# Patient Record
Sex: Female | Born: 1995 | Race: White | Hispanic: Yes | Marital: Married | Smoking: Never smoker
Health system: Southern US, Community
[De-identification: ages and names within clinical notes are randomized; demographics above are authoritative.]

## PROBLEM LIST (undated history)

## (undated) DIAGNOSIS — G43109 Migraine with aura, not intractable, without status migrainosus: Secondary | ICD-10-CM

## (undated) DIAGNOSIS — F32A Depression, unspecified: Secondary | ICD-10-CM

## (undated) DIAGNOSIS — F329 Major depressive disorder, single episode, unspecified: Secondary | ICD-10-CM

## (undated) DIAGNOSIS — L709 Acne, unspecified: Secondary | ICD-10-CM

## (undated) DIAGNOSIS — F419 Anxiety disorder, unspecified: Secondary | ICD-10-CM

## (undated) DIAGNOSIS — K219 Gastro-esophageal reflux disease without esophagitis: Secondary | ICD-10-CM

## (undated) DIAGNOSIS — T7840XA Allergy, unspecified, initial encounter: Secondary | ICD-10-CM

## (undated) DIAGNOSIS — N390 Urinary tract infection, site not specified: Secondary | ICD-10-CM

## (undated) DIAGNOSIS — J45909 Unspecified asthma, uncomplicated: Secondary | ICD-10-CM

## (undated) DIAGNOSIS — J3081 Allergic rhinitis due to animal (cat) (dog) hair and dander: Secondary | ICD-10-CM

## (undated) HISTORY — DX: Allergy, unspecified, initial encounter: T78.40XA

## (undated) HISTORY — DX: Depression, unspecified: F32.A

## (undated) HISTORY — DX: Migraine with aura, not intractable, without status migrainosus: G43.109

## (undated) HISTORY — DX: Urinary tract infection, site not specified: N39.0

## (undated) HISTORY — DX: Unspecified asthma, uncomplicated: J45.909

## (undated) HISTORY — DX: Anxiety disorder, unspecified: F41.9

## (undated) HISTORY — DX: Allergic rhinitis due to animal (cat) (dog) hair and dander: J30.81

## (undated) HISTORY — DX: Major depressive disorder, single episode, unspecified: F32.9

## (undated) HISTORY — PX: WISDOM TOOTH EXTRACTION: SHX21

---
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
## 2017-04-26 ENCOUNTER — Ambulatory Visit (INDEPENDENT_AMBULATORY_CARE_PROVIDER_SITE_OTHER): Payer: Commercial Managed Care - PPO | Admitting: Obstetrics and Gynecology

## 2017-04-26 ENCOUNTER — Encounter: Payer: Self-pay | Admitting: Obstetrics and Gynecology

## 2017-04-26 VITALS — BP 100/62 | HR 80 | Resp 16 | Ht 65.0 in | Wt 128.6 lb

## 2017-04-26 DIAGNOSIS — Z113 Encounter for screening for infections with a predominantly sexual mode of transmission: Secondary | ICD-10-CM | POA: Diagnosis not present

## 2017-04-26 DIAGNOSIS — Z01419 Encounter for gynecological examination (general) (routine) without abnormal findings: Secondary | ICD-10-CM

## 2017-04-26 DIAGNOSIS — N899 Noninflammatory disorder of vagina, unspecified: Secondary | ICD-10-CM | POA: Diagnosis not present

## 2017-04-26 NOTE — Progress Notes (Signed)
21 y.o. G0P0000 Single Caucasian female here for annual exam.    Menses are not a problem. Interested not to have a menstrual period.   Used a cream as a child "to help increase vaginal size."  Able to use tampons but with difficulty.  Unable to have penetration.   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   Labs with PCP.   PCP:  Deatra JamesVyvyan Sun, MD  Patient's last menstrual period was 04/23/2017 (exact date).     Period Cycle (Days): 30 Period Duration (Days): 5 days Period Pattern: Regular Menstrual Flow: Moderate Menstrual Control: Maxi pad Menstrual Control Change Freq (Hours): every 4-6 hours Dysmenorrhea: (mild to moderate)     Sexually active: Yes.   Female/female The current method of family planning is condoms most of the time.    Exercising: Yes.    Weights, treadmill, high intensity training Smoker:  no  Health Maintenance: Pap: n/a History of abnormal Pap:  n/a MMG:  n/a Colonoscopy:  n/a BMD:   n/a  Result  n/a TDaP: up to date--in college Gardasil:   no HIV:no Hep C:no Screening Labs:   ----   reports that  has never smoked. she has never used smokeless tobacco. She reports that she does not drink alcohol or use drugs.  Past Medical History:  Diagnosis Date  . Allergy    Has allergies to dogs and cats.  . Anxiety   . Asthma   . Cat allergies 04/20/2013  . Depression   . Migraine headache with aura     Past Surgical History:  Procedure Laterality Date  . WISDOM TOOTH EXTRACTION      Current Outpatient Medications  Medication Sig Dispense Refill  . albuterol (PROVENTIL HFA;VENTOLIN HFA) 108 (90 BASE) MCG/ACT inhaler Inhale 2 puffs into the lungs every 6 (six) hours as needed for wheezing (One for school and one for home). 2 Inhaler 3  . cetirizine (ZYRTEC) 10 MG tablet Take 10 mg by mouth daily.    . famotidine (PEPCID AC) 10 MG chewable tablet Chew 10 mg by mouth daily as needed for heartburn.    . fluticasone  (FLONASE) 50 MCG/ACT nasal spray Place 1 spray into both nostrils 2 (two) times daily as needed for allergies or rhinitis.    Marland Kitchen. ibuprofen (ADVIL,MOTRIN) 200 MG tablet Take 400-600 mg by mouth 2 (two) times daily as needed for headache or moderate pain.    Marland Kitchen. spironolactone (ALDACTONE) 25 MG tablet Take 25 mg daily by mouth.     No current facility-administered medications for this visit.     Family History  Problem Relation Age of Onset  . Hypertension Mother   . Hyperlipidemia Mother   . Scoliosis Mother   . Lupus Maternal Grandmother   . Osteoporosis Maternal Grandmother   . Heart disease Maternal Grandfather   . Hypertension Maternal Grandfather   . Diabetes Maternal Grandfather   . Emphysema Maternal Grandfather   . Heart attack Maternal Grandfather        occurred < 19 years old  . Hyperlipidemia Maternal Grandfather   . COPD Maternal Grandfather   . Cancer Maternal Grandfather        skin cancer  . Osteoarthritis Paternal Grandmother   . Diabetes Paternal Grandfather   . Heart disease Paternal Grandfather   . Kidney failure Paternal Grandfather        2 transplants due to DM  . Hypertension Paternal Grandfather   . Hyperlipidemia Paternal Grandfather   .  Stroke Paternal Grandfather   . Parkinson's disease Paternal Grandfather     ROS:  Pertinent items are noted in HPI.  Otherwise, a comprehensive ROS was negative.  Exam:   BP 100/62 (BP Location: Right Arm, Patient Position: Sitting, Cuff Size: Normal)   Pulse 80   Resp 16   Ht 5\' 5"  (1.651 m)   Wt 128 lb 9.6 oz (58.3 kg)   LMP 04/23/2017 (Exact Date)   BMI 21.40 kg/m     General appearance: alert, cooperative and appears stated age Head: Normocephalic, without obvious abnormality, atraumatic Neck: no adenopathy, supple, symmetrical, trachea midline and thyroid normal to inspection and palpation Lungs: clear to auscultation bilaterally Breasts: normal appearance, no masses or tenderness, No nipple retraction or  dimpling, No nipple discharge or bleeding, No axillary or supraclavicular adenopathy Heart: regular rate and rhythm Abdomen: soft, non-tender; no masses, no organomegaly Extremities: extremities normal, atraumatic, no cyanosis or edema Skin: Skin color, texture, turgor normal. No rashes or lesions Lymph nodes: Cervical, supraclavicular, and axillary nodes normal. No abnormal inguinal nodes palpated Neurologic: Grossly normal  Pelvic: External genitalia:  Vaginal opening small with skin covering over the lower portion of the vaginal opening creating the appearance of a long perineal body.  Skin palpates to be thin. Small Q tip enters the vaginal canal without difficulty.              Urethra:  normal appearing urethra with no masses, tenderness or lesions              Bartholins and Skenes: normal                 Vagina: normal appearing vagina with normal color and discharge, no lesions              Cervix: no lesions              Pap taken: No. Bimanual Exam:   Unable to perform.              Rectal exam: Yes.  .  Uterus small and nontender.  No adnexal masses.              Anus:  normal sphincter tone, no lesions  Chaperone was present for exam.  Assessment:   Well woman visit with normal exam. STD screening. Migraine with aura.  Hymenal abnormality.   Plan: Mammogram screening discussed. Recommended self breast awareness. Pap and HR HPV as above. Guidelines for Calcium, Vitamin D, regular exercise program including cardiovascular and weight bearing exercise. I discussed options for contraception.  For now, we will not initiate.  STD screening from serum and urine.  We reviewed Gardasil and she will consider. She will return for pelvic US to further define her reproductive anatomy.  We did discuss revision of the hymenal opening through outpatient surgery.   Follow up annually and prn.   After visit summary provided.

## 2017-04-26 NOTE — Patient Instructions (Addendum)
EXERCISE AND DIET:  We recommended that you start or continue a regular exercise program for good health. Regular exercise means any activity that makes your heart beat faster and makes you sweat.  We recommend exercising at least 30 minutes per day at least 3 days a week, preferably 4 or 5.  We also recommend a diet low in fat and sugar.  Inactivity, poor dietary choices and obesity can cause diabetes, heart attack, stroke, and kidney damage, among others.    ALCOHOL AND SMOKING:  Women should limit their alcohol intake to no more than 7 drinks/beers/glasses of wine (combined, not each!) per week. Moderation of alcohol intake to this level decreases your risk of breast cancer and liver damage. And of course, no recreational drugs are part of a healthy lifestyle.  And absolutely no smoking or even second hand smoke. Most people know smoking can cause heart and lung diseases, but did you know it also contributes to weakening of your bones? Aging of your skin?  Yellowing of your teeth and nails?  CALCIUM AND VITAMIN D:  Adequate intake of calcium and Vitamin D are recommended.  The recommendations for exact amounts of these supplements seem to change often, but generally speaking 600 mg of calcium (either carbonate or citrate) and 800 units of Vitamin D per day seems prudent. Certain women may benefit from higher intake of Vitamin D.  If you are among these women, your doctor will have told you during your visit.    PAP SMEARS:  Pap smears, to check for cervical cancer or precancers,  have traditionally been done yearly, although recent scientific advances have shown that most women can have pap smears less often.  However, every woman still should have a physical exam from her gynecologist every year. It will include a breast check, inspection of the vulva and vagina to check for abnormal growths or skin changes, a visual exam of the cervix, and then an exam to evaluate the size and shape of the uterus and  ovaries.  And after 19 years of age, a rectal exam is indicated to check for rectal cancers. We will also provide age appropriate advice regarding health maintenance, like when you should have certain vaccines, screening for sexually transmitted diseases, bone density testing, colonoscopy, mammograms, etc.   MAMMOGRAMS:  All women over 52 years old should have a yearly mammogram. Many facilities now offer a "3D" mammogram, which may cost around $50 extra out of pocket. If possible,  we recommend you accept the option to have the 3D mammogram performed.  It both reduces the number of women who will be called back for extra views which then turn out to be normal, and it is better than the routine mammogram at detecting truly abnormal areas.    COLONOSCOPY:  Colonoscopy to screen for colon cancer is recommended for all women at age 18.  We know, you hate the idea of the prep.  We agree, BUT, having colon cancer and not knowing it is worse!!  Colon cancer so often starts as a polyp that can be seen and removed at colonscopy, which can quite literally save your life!  And if your first colonoscopy is normal and you have no family history of colon cancer, most women don't have to have it again for 10 years.  Once every ten years, you can do something that may end up saving your life, right?  We will be happy to help you get it scheduled when you are ready.  Be sure to check your insurance coverage so you understand how much it will cost.  It may be covered as a preventative service at no cost, but you should check your particular policy.     Contraception Choices Contraception (birth control) is the use of any methods or devices to prevent pregnancy. Below are some methods to help avoid pregnancy. Hormonal methods  Contraceptive implant. This is a thin, plastic tube containing progesterone hormone. It does not contain estrogen hormone. Your health care provider inserts the tube in the inner part of the upper  arm. The tube can remain in place for up to 3 years. After 3 years, the implant must be removed. The implant prevents the ovaries from releasing an egg (ovulation), thickens the cervical mucus to prevent sperm from entering the uterus, and thins the lining of the inside of the uterus.  Progesterone-only injections. These injections are given every 3 months by your health care provider to prevent pregnancy. This synthetic progesterone hormone stops the ovaries from releasing eggs. It also thickens cervical mucus and changes the uterine lining. This makes it harder for sperm to survive in the uterus.  Birth control pills. These pills contain estrogen and progesterone hormone. They work by preventing the ovaries from releasing eggs (ovulation). They also cause the cervical mucus to thicken, preventing the sperm from entering the uterus. Birth control pills are prescribed by a health care provider.Birth control pills can also be used to treat heavy periods.  Minipill. This type of birth control pill contains only the progesterone hormone. They are taken every day of each month and must be prescribed by your health care provider.  Birth control patch. The patch contains hormones similar to those in birth control pills. It must be changed once a week and is prescribed by a health care provider.  Vaginal ring. The ring contains hormones similar to those in birth control pills. It is left in the vagina for 3 weeks, removed for 1 week, and then a new one is put back in place. The patient must be comfortable inserting and removing the ring from the vagina.A health care provider's prescription is necessary.  Emergency contraception. Emergency contraceptives prevent pregnancy after unprotected sexual intercourse. This pill can be taken right after sex or up to 5 days after unprotected sex. It is most effective the sooner you take the pills after having sexual intercourse. Most emergency contraceptive pills are  available without a prescription. Check with your pharmacist. Do not use emergency contraception as your only form of birth control. Barrier methods  Female condom. This is a thin sheath (latex or rubber) that is worn over the penis during sexual intercourse. It can be used with spermicide to increase effectiveness.  Female condom. This is a soft, loose-fitting sheath that is put into the vagina before sexual intercourse.  Diaphragm. This is a soft, latex, dome-shaped barrier that must be fitted by a health care provider. It is inserted into the vagina, along with a spermicidal jelly. It is inserted before intercourse. The diaphragm should be left in the vagina for 6 to 8 hours after intercourse.  Cervical cap. This is a round, soft, latex or plastic cup that fits over the cervix and must be fitted by a health care provider. The cap can be left in place for up to 48 hours after intercourse.  Sponge. This is a soft, circular piece of polyurethane foam. The sponge has spermicide in it. It is inserted into the vagina after wetting it  and before sexual intercourse.  Spermicides. These are chemicals that kill or block sperm from entering the cervix and uterus. They come in the form of creams, jellies, suppositories, foam, or tablets. They do not require a prescription. They are inserted into the vagina with an applicator before having sexual intercourse. The process must be repeated every time you have sexual intercourse. Intrauterine contraception  Intrauterine device (IUD). This is a T-shaped device that is put in a woman's uterus during a menstrual period to prevent pregnancy. There are 2 types: ? Copper IUD. This type of IUD is wrapped in copper wire and is placed inside the uterus. Copper makes the uterus and fallopian tubes produce a fluid that kills sperm. It can stay in place for 10 years. ? Hormone IUD. This type of IUD contains the hormone progestin (synthetic progesterone). The hormone thickens  the cervical mucus and prevents sperm from entering the uterus, and it also thins the uterine lining to prevent implantation of a fertilized egg. The hormone can weaken or kill the sperm that get into the uterus. It can stay in place for 3-5 years, depending on which type of IUD is used. Permanent methods of contraception  Female tubal ligation. This is when the woman's fallopian tubes are surgically sealed, tied, or blocked to prevent the egg from traveling to the uterus.  Hysteroscopic sterilization. This involves placing a small coil or insert into each fallopian tube. Your doctor uses a technique called hysteroscopy to do the procedure. The device causes scar tissue to form. This results in permanent blockage of the fallopian tubes, so the sperm cannot fertilize the egg. It takes about 3 months after the procedure for the tubes to become blocked. You must use another form of birth control for these 3 months.  Female sterilization. This is when the female has the tubes that carry sperm tied off (vasectomy).This blocks sperm from entering the vagina during sexual intercourse. After the procedure, the man can still ejaculate fluid (semen). Natural planning methods  Natural family planning. This is not having sexual intercourse or using a barrier method (condom, diaphragm, cervical cap) on days the woman could become pregnant.  Calendar method. This is keeping track of the length of each menstrual cycle and identifying when you are fertile.  Ovulation method. This is avoiding sexual intercourse during ovulation.  Symptothermal method. This is avoiding sexual intercourse during ovulation, using a thermometer and ovulation symptoms.  Post-ovulation method. This is timing sexual intercourse after you have ovulated. Regardless of which type or method of contraception you choose, it is important that you use condoms to protect against the transmission of sexually transmitted infections (STIs). Talk with  your health care provider about which form of contraception is most appropriate for you. This information is not intended to replace advice given to you by your health care provider. Make sure you discuss any questions you have with your health care provider. Document Released: 05/28/2005 Document Revised: 11/03/2015 Document Reviewed: 11/20/2012 Elsevier Interactive Patient Education  2017 Elsevier Inc.  Human Papillomavirus Quadrivalent Vaccine suspension for injection What is this medicine? HUMAN PAPILLOMAVIRUS VACCINE (HYOO muhn pap uh LOH muh vahy ruhs vak SEEN) is a vaccine. It is used to prevent infections of four types of the human papillomavirus. In women, the vaccine may lower your risk of getting cervical, vaginal, vulvar, or anal cancer and genital warts. In men, the vaccine may lower your risk of getting genital warts and anal cancer. You cannot get these diseases from the  vaccine. This vaccine does not treat these diseases. This medicine may be used for other purposes; ask your health care provider or pharmacist if you have questions. COMMON BRAND NAME(S): Gardasil What should I tell my health care provider before I take this medicine? They need to know if you have any of these conditions: -fever or infection -hemophilia -HIV infection or AIDS -immune system problems -low platelet count -an unusual reaction to Human Papillomavirus Vaccine, yeast, other medicines, foods, dyes, or preservatives -pregnant or trying to get pregnant -breast-feeding How should I use this medicine? This vaccine is for injection in a muscle on your upper arm or thigh. It is given by a health care professional. Dennis Bast will be observed for 15 minutes after each dose. Sometimes, fainting happens after the vaccine is given. You may be asked to sit or lie down during the 15 minutes. Three doses are given. The second dose is given 2 months after the first dose. The last dose is given 4 months after the second  dose. A copy of a Vaccine Information Statement will be given before each vaccination. Read this sheet carefully each time. The sheet may change frequently. Talk to your pediatrician regarding the use of this medicine in children. While this drug may be prescribed for children as young as 83 years of age for selected conditions, precautions do apply. Overdosage: If you think you have taken too much of this medicine contact a poison control center or emergency room at once. NOTE: This medicine is only for you. Do not share this medicine with others. What if I miss a dose? All 3 doses of the vaccine should be given within 6 months. Remember to keep appointments for follow-up doses. Your health care provider will tell you when to return for the next vaccine. Ask your health care professional for advice if you are unable to keep an appointment or miss a scheduled dose. What may interact with this medicine? -other vaccines This list may not describe all possible interactions. Give your health care provider a list of all the medicines, herbs, non-prescription drugs, or dietary supplements you use. Also tell them if you smoke, drink alcohol, or use illegal drugs. Some items may interact with your medicine. What should I watch for while using this medicine? This vaccine may not fully protect everyone. Continue to have regular pelvic exams and cervical or anal cancer screenings as directed by your doctor. The Human Papillomavirus is a sexually transmitted disease. It can be passed by any kind of sexual activity that involves genital contact. The vaccine works best when given before you have any contact with the virus. Many people who have the virus do not have any signs or symptoms. Tell your doctor or health care professional if you have any reaction or unusual symptom after getting the vaccine. What side effects may I notice from receiving this medicine? Side effects that you should report to your doctor or  health care professional as soon as possible: -allergic reactions like skin rash, itching or hives, swelling of the face, lips, or tongue -breathing problems -feeling faint or lightheaded, falls Side effects that usually do not require medical attention (report to your doctor or health care professional if they continue or are bothersome): -cough -dizziness -fever -headache -nausea -redness, warmth, swelling, pain, or itching at site where injected This list may not describe all possible side effects. Call your doctor for medical advice about side effects. You may report side effects to FDA at 1-800-FDA-1088. Where should  I keep my medicine? This drug is given in a hospital or clinic and will not be stored at home. NOTE: This sheet is a summary. It may not cover all possible information. If you have questions about this medicine, talk to your doctor, pharmacist, or health care provider.  2018 Elsevier/Gold Standard (2013-07-20 13:14:33)

## 2017-04-27 LAB — HEP, RPR, HIV PANEL
HEP B S AG: NEGATIVE
HIV Screen 4th Generation wRfx: NONREACTIVE
RPR: NONREACTIVE

## 2017-04-27 LAB — HEPATITIS C ANTIBODY: Hep C Virus Ab: 0.1 s/co ratio (ref 0.0–0.9)

## 2017-04-30 LAB — CHLAMYDIA/GONOCOCCUS/TRICHOMONAS, NAA
Chlamydia by NAA: NEGATIVE
Gonococcus by NAA: NEGATIVE
Trich vag by NAA: NEGATIVE

## 2017-05-16 ENCOUNTER — Ambulatory Visit (INDEPENDENT_AMBULATORY_CARE_PROVIDER_SITE_OTHER): Payer: Commercial Managed Care - PPO

## 2017-05-16 ENCOUNTER — Encounter: Payer: Self-pay | Admitting: Obstetrics and Gynecology

## 2017-05-16 ENCOUNTER — Other Ambulatory Visit: Payer: Self-pay | Admitting: Obstetrics and Gynecology

## 2017-05-16 ENCOUNTER — Ambulatory Visit (INDEPENDENT_AMBULATORY_CARE_PROVIDER_SITE_OTHER): Payer: Commercial Managed Care - PPO | Admitting: Obstetrics and Gynecology

## 2017-05-16 VITALS — BP 102/60 | HR 60 | Ht 65.0 in | Wt 129.0 lb

## 2017-05-16 DIAGNOSIS — N899 Noninflammatory disorder of vagina, unspecified: Secondary | ICD-10-CM | POA: Diagnosis not present

## 2017-05-16 DIAGNOSIS — N898 Other specified noninflammatory disorders of vagina: Secondary | ICD-10-CM

## 2017-05-16 NOTE — Progress Notes (Signed)
Patient ID: Judy MinorChloe [name], female   DOB: May 14, 1996, 21 y.o.   MRN: [id number] GYNECOLOGY  VISIT   HPI: 21 y.o.   Single  Caucasian  female   [id number] with Patient's last menstrual period was 04/23/2017 (exact date).   here for pelvic ultrasound to further define reproductive anatomy.    Has partial imperforate hymen.  Does menstruate. Unable to use tampons.  GYNECOLOGIC HISTORY: Patient's last menstrual period was 04/23/2017 (exact date). Contraception: condoms Menopausal hormone therapy:  n/a Last mammogram:  n/a Last pap smear:   n/a        OB History    Gravida Para Term Preterm AB Living   0 0 0 0 0 0   SAB TAB Ectopic Multiple Live Births   0 0 0 0 0         Patient Active Problem List   Diagnosis Date Noted  . 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 Costochondritis 02/04/2014  . Unspecified asthma(493.90) 04/20/2013  . Cat allergies 04/20/2013  . Seasonal allergies 04/20/2013    Past Medical History:  Diagnosis Date  . Allergy    Has allergies to dogs and cats.  . Anxiety   . Asthma   . Cat allergies 04/20/2013  . Depression   . Migraine headache with aura     Past Surgical History:  Procedure Laterality Date  . WISDOM TOOTH EXTRACTION      Current Outpatient Medications  Medication Sig Dispense Refill  . albuterol (PROVENTIL HFA;VENTOLIN HFA) 108 (90 BASE) MCG/ACT inhaler Inhale 2 puffs into the lungs every 6 (six) hours as needed for wheezing (One for school and one for home). 2 Inhaler 3  . cetirizine (ZYRTEC) 10 MG tablet Take 10 mg by mouth daily.    . famotidine (PEPCID AC) 10 MG chewable tablet Chew 10 mg by mouth daily as needed for heartburn.    . fluticasone (FLONASE) 50 MCG/ACT nasal spray Place 1 spray into both nostrils 2 (two) times daily as needed for allergies or rhinitis.    Marland Kitchen. ibuprofen (ADVIL,MOTRIN) 200 MG tablet Take 400-600 mg by mouth 2 (two) times daily as needed for headache or moderate pain.    Marland Kitchen. spironolactone (ALDACTONE) 25 MG  tablet Take 25 mg daily by mouth.     No current facility-administered medications for this visit.      ALLERGIES: Pollen extract  Family History  Problem Relation Age of Onset  . Hypertension Mother   . Hyperlipidemia Mother   . Scoliosis Mother   . Lupus Maternal Grandmother   . Osteoporosis Maternal Grandmother   . Heart disease Maternal Grandfather   . Hypertension Maternal Grandfather   . Diabetes Maternal Grandfather   . Emphysema Maternal Grandfather   . Heart attack Maternal Grandfather        occurred < 19 years old  . Hyperlipidemia Maternal Grandfather   . COPD Maternal Grandfather   . Cancer Maternal Grandfather        skin cancer  . Osteoarthritis Paternal Grandmother   . Diabetes Paternal Grandfather   . Heart disease Paternal Grandfather   . Kidney failure Paternal Grandfather        2 transplants due to DM  . Hypertension Paternal Grandfather   . Hyperlipidemia Paternal Grandfather   . Stroke Paternal Grandfather   . Parkinson's disease Paternal Grandfather     Social History   Socioeconomic History  . Marital status: Single    Spouse name: Not on file  . Number of children:  Not on file  . Years of education: Not on file  . Highest education level: Not on file  Social Needs  . Financial resource strain: Not on file  . Food insecurity - worry: Not on file  . Food insecurity - inability: Not on file  . Transportation needs - medical: Not on file  . Transportation needs - non-medical: Not on file  Occupational History  . Not on file  Tobacco Use  . Smoking status: Never Smoker  . Smokeless tobacco: Never Used  Substance and Sexual Activity  . Alcohol use: No  . Drug use: No  . Sexual activity: No    Birth control/protection: Condom    Comment: female/female partner  Other Topics Concern  . Not on file  Social History Narrative   She lives at home with her parents and older sister. [place] moved from [place] in Jan. 2014. She will be  going into 10th grade in the fall. Her current hopes after high school are to become a nun. She denies use of tobacco, ethanol, or illicit substances. She denies every having sexual intercourse.    ROS:  Pertinent items are noted in HPI.  PHYSICAL EXAMINATION:    BP 102/60 (BP Location: Right Arm, Patient Position: Sitting, Cuff Size: Normal)   Pulse 60   Ht 5\' 5"  (1.651 m)   Wt 129 lb (58.5 kg)   LMP 04/23/2017 (Exact Date)   BMI 21.47 kg/m     General appearance: alert, cooperative and appears stated age   Pelvic US Normal uterus.  EMS 9.42. Left CL cyst 34 mm.  Right ovary normal.  No free fluid.   ASSESSMENT  Hymenal remnant.   PLAN  Review or normal pelvic ultrasound.  Discussion of hymenal remnant and risks/benefits, and alternatives to surgical correction.  Benefits include tampon usage, vaginal coital activity, and preparation for future vaginal delivery.  Risks include but are not limited to bleeding, infection, damage to surrounding organs, and reaction to anesthesia.  Patient would like to proceed with surgical care.   An After Visit Summary was printed and given to the patient.  _25_____ minutes face to face time of which over 50% was spent in counseling.

## 2017-05-16 NOTE — Progress Notes (Signed)
Encounter reviewed by Dr. Keyion Knack Amundson C. Silva.  

## 2017-05-22 ENCOUNTER — Telehealth: Payer: Self-pay | Admitting: Obstetrics and Gynecology

## 2017-05-22 NOTE — Telephone Encounter (Signed)
Call placed to patient to review benefits for surgical procedure. Patient advised she is "heading out to an appointment" and will call back today, after her appointment   cc: Billie RuddySally Yeakley, RN

## 2017-05-22 NOTE — Telephone Encounter (Signed)
Routing to Sally Yeakley, RN for review and advise. 

## 2017-05-22 NOTE — Telephone Encounter (Signed)
Patient has questions regarding surgery and dates.

## 2017-05-23 ENCOUNTER — Ambulatory Visit: Payer: Commercial Managed Care - PPO | Admitting: Obstetrics and Gynecology

## 2017-05-23 ENCOUNTER — Encounter (HOSPITAL_COMMUNITY): Payer: Self-pay | Admitting: *Deleted

## 2017-05-23 ENCOUNTER — Encounter: Payer: Self-pay | Admitting: Obstetrics and Gynecology

## 2017-05-23 ENCOUNTER — Other Ambulatory Visit: Payer: Self-pay

## 2017-05-23 VITALS — BP 108/60 | HR 72 | Resp 16 | Wt 127.0 lb

## 2017-05-23 DIAGNOSIS — Q5279 Other congenital malformations of vulva: Secondary | ICD-10-CM

## 2017-05-23 DIAGNOSIS — Z3009 Encounter for other general counseling and advice on contraception: Secondary | ICD-10-CM | POA: Diagnosis not present

## 2017-05-23 DIAGNOSIS — N895 Stricture and atresia of vagina: Secondary | ICD-10-CM | POA: Diagnosis not present

## 2017-05-23 MED ORDER — NORETHINDRONE 0.35 MG PO TABS: 1.0000 | ORAL_TABLET | Freq: Every day | ORAL | 3 refills | Status: DC

## 2017-05-23 NOTE — Telephone Encounter (Signed)
Spoke with patient yesterday and desires to proceed with surgery with another provider in order to have procedure done while home from school on holiday break. Family has also met insurance deductible cost for year. Appointment scheduled with Dr Talbert Nan (Dr Quincy Simmonds on LOA due to family emergency). Tentative plan for surgery on 05-28-17.   Patient had appointment with Dr Talbert Nan today and plans to proceed with surgery as scheduled for 05-28-17. Surgical instruction sheet reviewed and printed copy provided.  Routing to provider for final review. Patient agreeable to disposition. Will close encounter.

## 2017-05-23 NOTE — Patient Instructions (Signed)
Norethindrone tablets (contraception) What is this medicine? NORETHINDRONE (nor eth IN drone) is an oral contraceptive. The product contains a female hormone known as a progestin. It is used to prevent pregnancy. This medicine may be used for other purposes; ask your health care provider or pharmacist if you have questions. COMMON BRAND NAME(S): Camila, Deblitane 28-Day, Errin, Heather, Jencycla, Jolivette, Lyza, Nor-QD, Nora-BE, Norlyroc, Ortho Micronor, Sharobel 28-Day What should I tell my health care provider before I take this medicine? They need to know if you have any of these conditions: -blood vessel disease or blood clots -breast, cervical, or vaginal cancer -diabetes -heart disease -kidney disease -liver disease -mental depression -migraine -seizures -stroke -vaginal bleeding -an unusual or allergic reaction to norethindrone, other medicines, foods, dyes, or preservatives -pregnant or trying to get pregnant -breast-feeding How should I use this medicine? Take this medicine by mouth with a glass of water. You may take it with or without food. Follow the directions on the prescription label. Take this medicine at the same time each day and in the order directed on the package. Do not take your medicine more often than directed. Contact your pediatrician regarding the use of this medicine in children. Special care may be needed. This medicine has been used in female children who have started having menstrual periods. A patient package insert for the product will be given with each prescription and refill. Read this sheet carefully each time. The sheet may change frequently. Overdosage: If you think you have taken too much of this medicine contact a poison control center or emergency room at once. NOTE: This medicine is only for you. Do not share this medicine with others. What if I miss a dose? Try not to miss a dose. Every time you miss a dose or take a dose late your chance of  pregnancy increases. When 1 pill is missed (even if only 3 hours late), take the missed pill as soon as possible and continue taking a pill each day at the regular time (use a back up method of birth control for the next 48 hours). If more than 1 dose is missed, use an additional birth control method for the rest of your pill pack until menses occurs. Contact your health care professional if more than 1 dose has been missed. What may interact with this medicine? Do not take this medicine with any of the following medications: -amprenavir or fosamprenavir -bosentan This medicine may also interact with the following medications: -antibiotics or medicines for infections, especially rifampin, rifabutin, rifapentine, and griseofulvin, and possibly penicillins or tetracyclines -aprepitant -barbiturate medicines, such as phenobarbital -carbamazepine -felbamate -modafinil -oxcarbazepine -phenytoin -ritonavir or other medicines for HIV infection or AIDS -St. John's wort -topiramate This list may not describe all possible interactions. Give your health care provider a list of all the medicines, herbs, non-prescription drugs, or dietary supplements you use. Also tell them if you smoke, drink alcohol, or use illegal drugs. Some items may interact with your medicine. What should I watch for while using this medicine? Visit your doctor or health care professional for regular checks on your progress. You will need a regular breast and pelvic exam and Pap smear while on this medicine. Use an additional method of birth control during the first cycle that you take these tablets. If you have any reason to think you are pregnant, stop taking this medicine right away and contact your doctor or health care professional. If you are taking this medicine for hormone related problems, it   may take several cycles of use to see improvement in your condition. This medicine does not protect you against HIV infection (AIDS)  or any other sexually transmitted diseases. What side effects may I notice from receiving this medicine? Side effects that you should report to your doctor or health care professional as soon as possible: -breast tenderness or discharge -pain in the abdomen, chest, groin or leg -severe headache -skin rash, itching, or hives -sudden shortness of breath -unusually weak or tired -vision or speech problems -yellowing of skin or eyes Side effects that usually do not require medical attention (report to your doctor or health care professional if they continue or are bothersome): -changes in sexual desire -change in menstrual flow -facial hair growth -fluid retention and swelling -headache -irritability -nausea -weight gain or loss This list may not describe all possible side effects. Call your doctor for medical advice about side effects. You may report side effects to FDA at 1-800-FDA-1088. Where should I keep my medicine? Keep out of the reach of children. Store at room temperature between 15 and 30 degrees C (59 and 86 degrees F). Throw away any unused medicine after the expiration date. NOTE: This sheet is a summary. It may not cover all possible information. If you have questions about this medicine, talk to your doctor, pharmacist, or health care provider.  2018 Elsevier/Gold Standard (2012-02-15 16:41:35)  

## 2017-05-23 NOTE — Progress Notes (Signed)
GYNECOLOGY  VISIT   HPI: 21 y.o.   Single  Caucasian  female   G0P0000 with Patient's last menstrual period was 05/21/2017.   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  The patient has cycles but is unable to have intercourse. She has placed a small tampon in the past, but it was uncomfortable.  Cycles q month x 4-5 days. Saturates a pad in 3-4 hours at most. She has intermittent cramps, mild to moderate. She has had a normal GYN ultrasound. She is interested in contraception, wants to be sexually active.   GYNECOLOGIC HISTORY: Patient's last menstrual period was 05/21/2017. Contraception: condoms  Menopausal hormone therapy: none         OB History    Gravida Para Term Preterm AB Living   0 0 0 0 0 0   SAB TAB Ectopic Multiple Live Births   0 0 0 0 0         Patient Active Problem List   Diagnosis Date Noted  . 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 Costochondritis 02/04/2014  . Unspecified asthma(493.90) 04/20/2013  . Cat allergies 04/20/2013  . Seasonal allergies 04/20/2013    Past Medical History:  Diagnosis Date  . Allergy    Has allergies to dogs and cats.  . Anxiety   . Asthma   . Cat allergies 04/20/2013  . Depression   . Migraine headache with aura     Past Surgical History:  Procedure Laterality Date  . WISDOM TOOTH EXTRACTION      Current Outpatient Medications  Medication Sig Dispense Refill  . albuterol (PROVENTIL HFA;VENTOLIN HFA) 108 (90 BASE) MCG/ACT inhaler Inhale 2 puffs into the lungs every 6 (six) hours as needed for wheezing (One for school and one for home). 2 Inhaler 3  . cetirizine (ZYRTEC) 10 MG tablet Take 10 mg by mouth daily as needed (for allergies.).     Marland Kitchen. chlorpheniramine (CHLOR-TRIMETON) 4 MG tablet Take 4 mg by mouth every 4 (four) hours as needed for allergies.    . famotidine (PEPCID AC) 10 MG chewable tablet Chew 10 mg by mouth daily as needed for heartburn.    . fluticasone (FLONASE) 50 MCG/ACT nasal spray Place 1  spray into both nostrils 2 (two) times daily as needed for allergies or rhinitis.    Marland Kitchen. ibuprofen (ADVIL,MOTRIN) 200 MG tablet Take 400-600 mg by mouth 2 (two) times daily as needed for headache or moderate pain.    . Melatonin 5 MG TABS Take 5 mg by mouth at bedtime as needed (for sleep.).    . Naphazoline-Pheniramine (OPCON-A) 0.027-0.315 % SOLN Place 1 drop into both eyes 3 (three) times daily as needed (for irritated/itchy eyes.).    Marland Kitchen. spironolactone (ALDACTONE) 25 MG tablet Take 25 mg by mouth at bedtime.      No current facility-administered medications for this visit.      ALLERGIES: Pollen extract  Family History  Problem Relation Age of Onset  . Hypertension Mother   . Hyperlipidemia Mother   . Scoliosis Mother   . Lupus Maternal Grandmother   . Osteoporosis Maternal Grandmother   . Heart disease Maternal Grandfather   . Hypertension Maternal Grandfather   . Diabetes Maternal Grandfather   . Emphysema Maternal Grandfather   . Heart attack Maternal Grandfather        occurred < 19 years old  . Hyperlipidemia Maternal Grandfather   . COPD Maternal Grandfather   . Cancer Maternal Grandfather  skin cancer  . Osteoarthritis Paternal Grandmother   . Diabetes Paternal Grandfather   . Heart disease Paternal Grandfather   . Kidney failure Paternal Grandfather        2 transplants due to DM  . Hypertension Paternal Grandfather   . Hyperlipidemia Paternal Grandfather   . Stroke Paternal Grandfather   . Parkinson's disease Paternal Grandfather     Social History   Socioeconomic History  . Marital status: Single    Spouse name: Not on file  . Number of children: Not on file  . Years of education: Not on file  . Highest education level: Not on file  Social Needs  . Financial resource strain: Not on file  . Food insecurity - worry: Not on file  . Food insecurity - inability: Not on file  . Transportation needs - medical: Not on file  . Transportation needs -  non-medical: Not on file  Occupational History  . Not on file  Tobacco Use  . Smoking status: Never Smoker  . Smokeless tobacco: Never Used  Substance and Sexual Activity  . Alcohol use: No  . Drug use: No  . Sexual activity: No    Birth control/protection: Condom    Comment: female/female partner  Other Topics Concern  . Not on file  Social History Narrative   She lives at home with her parents and older sister. Family moved from [place] in Jan. 2014. She will be going into 10th grade in the fall. Her current hopes after high school are to become a nun. She denies use of tobacco, ethanol, or illicit substances. She denies every having sexual intercourse.    Review of Systems  Constitutional: Negative.   HENT: Negative.   Eyes: Negative.   Respiratory: Negative.   Cardiovascular: Negative.   Gastrointestinal: Negative.   Genitourinary: Negative.   Musculoskeletal: Negative.   Skin: Negative.   Neurological: Negative.   Endo/Heme/Allergies: Negative.   Psychiatric/Behavioral: Negative.     PHYSICAL EXAMINATION:    BP 108/60 (BP Location: Right Arm, Patient Position: Sitting, Cuff Size: Normal)   Pulse 72   Resp 16   Wt 127 lb (57.6 kg)   LMP 05/21/2017   BMI 21.13 kg/m     General appearance: alert, cooperative and appears stated age Neck: no adenopathy, supple, symmetrical, trachea midline and thyroid normal to inspection and palpation Heart: regular rate and rhythm Lungs: CTAB Abdomen: soft, non-tender; bowel sounds normal; no masses,  no organomegaly Extremities: normal, atraumatic, no cyanosis Skin: normal color, texture and turgor, no rashes or lesions Lymph: normal cervical supraclavicular and inguinal nodes Neurologic: grossly normal   Pelvic: External genitalia:  no lesions, she has a small vaginal opening, perineal skin covering the lower portion of her vagina. Able to insert a cotton swab and my pinky finger into her vagina. The perineal skin is  thin and covering the lower portion of her vagina. She doesn't have an imperforate hymen.               Urethra:  normal appearing urethra with no masses, tenderness or lesions              Bartholins and Skenes: normal                               Chaperone was present for exam.  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 Discussed options of contraception, not a candidate for OCP's She would like to start with the mini-pill, consider the nexplanon (information given) Discussed use of condoms   An After Visit Summary was printed and given to the patient.  ~25 minutes face to face time of which over 50% was spent in counseling.

## 2017-05-24 ENCOUNTER — Telehealth: Payer: Self-pay | Admitting: Obstetrics and Gynecology

## 2017-05-24 ENCOUNTER — Encounter: Payer: Self-pay | Admitting: Obstetrics and Gynecology

## 2017-05-24 NOTE — Telephone Encounter (Signed)
Patient says she was told to speak with Kennon RoundsSally today about benefits for surgery.

## 2017-05-24 NOTE — Telephone Encounter (Signed)
Call to patient . Advised of business office effort working on [employer].. Business office will provide update when complete.   Routing to provider for final review. Patient agreeable to disposition. Will close encounter.

## 2017-05-24 NOTE — H&P (Signed)
GYNECOLOGY  VISIT   HPI: 21 y.o.   Single  Caucasian  female   G0P0000 with Patient's last menstrual period was 05/21/2017.   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  The patient has cycles but is unable to have intercourse. She has placed a small tampon in the past, but it was uncomfortable.  Cycles q month x 4-5 days. Saturates a pad in 3-4 hours at most. She has intermittent cramps, mild to moderate. She has had a normal GYN ultrasound. She is interested in contraception, wants to be sexually active.   GYNECOLOGIC HISTORY: Patient's last menstrual period was 05/21/2017. Contraception: condoms  Menopausal hormone therapy: none         OB History    Gravida Para Term Preterm AB Living   0 0 0 0 0 0   SAB TAB Ectopic Multiple Live Births   0 0 0 0 0         Patient Active Problem List   Diagnosis Date Noted  . 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 Costochondritis 02/04/2014  . Unspecified asthma(493.90) 04/20/2013  . Cat allergies 04/20/2013  . Seasonal allergies 04/20/2013    Past Medical History:  Diagnosis Date  . Allergy    Has allergies to dogs and cats.  . Anxiety   . Asthma   . Cat allergies 04/20/2013  . Depression   . Migraine headache with aura     Past Surgical History:  Procedure Laterality Date  . WISDOM TOOTH EXTRACTION      Current Outpatient Medications  Medication Sig Dispense Refill  . albuterol (PROVENTIL HFA;VENTOLIN HFA) 108 (90 BASE) MCG/ACT inhaler Inhale 2 puffs into the lungs every 6 (six) hours as needed for wheezing (One for school and one for home). 2 Inhaler 3  . cetirizine (ZYRTEC) 10 MG tablet Take 10 mg by mouth daily as needed (for allergies.).     Marland Kitchen chlorpheniramine (CHLOR-TRIMETON) 4 MG tablet Take 4 mg by mouth every 4 (four) hours as needed for allergies.    . famotidine (PEPCID AC) 10 MG chewable tablet Chew 10 mg by mouth daily as needed for heartburn.    . fluticasone (FLONASE) 50 MCG/ACT nasal spray Place 1  spray into both nostrils 2 (two) times daily as needed for allergies or rhinitis.    Marland Kitchen ibuprofen (ADVIL,MOTRIN) 200 MG tablet Take 400-600 mg by mouth 2 (two) times daily as needed for headache or moderate pain.    . Melatonin 5 MG TABS Take 5 mg by mouth at bedtime as needed (for sleep.).    . Naphazoline-Pheniramine (OPCON-A) 0.027-0.315 % SOLN Place 1 drop into both eyes 3 (three) times daily as needed (for irritated/itchy eyes.).    Marland Kitchen spironolactone (ALDACTONE) 25 MG tablet Take 25 mg by mouth at bedtime.      No current facility-administered medications for this visit.      ALLERGIES: Pollen extract  Family History  Problem Relation Age of Onset  . Hypertension Mother   . Hyperlipidemia Mother   . Scoliosis Mother   . Lupus Maternal Grandmother   . Osteoporosis Maternal Grandmother   . Heart disease Maternal Grandfather   . Hypertension Maternal Grandfather   . Diabetes Maternal Grandfather   . Emphysema Maternal Grandfather   . Heart attack Maternal Grandfather        occurred < 74 years old  . Hyperlipidemia Maternal Grandfather   . COPD Maternal Grandfather   . Cancer Maternal Grandfather  skin cancer  . Osteoarthritis Paternal Grandmother   . Diabetes Paternal Grandfather   . Heart disease Paternal Grandfather   . Kidney failure Paternal Grandfather        2 transplants due to DM  . Hypertension Paternal Grandfather   . Hyperlipidemia Paternal Grandfather   . Stroke Paternal Grandfather   . Parkinson's disease Paternal Grandfather     Social History   Socioeconomic History  . Marital status: Single    Spouse name: Not on file  . Number of children: Not on file  . Years of education: Not on file  . Highest education level: Not on file  Social Needs  . Financial resource strain: Not on file  . Food insecurity - worry: Not on file  . Food insecurity - inability: Not on file  . Transportation needs - medical: Not on file  . Transportation needs -  non-medical: Not on file  Occupational History  . Not on file  Tobacco Use  . Smoking status: Never Smoker  . Smokeless tobacco: Never Used  Substance and Sexual Activity  . Alcohol use: No  . Drug use: No  . Sexual activity: No    Birth control/protection: Condom    Comment: female/female partner  Other Topics Concern  . Not on file  Social History Narrative   She lives at home with her parents and older sister. Family moved from [place] in Jan. 2014. She will be going into 10th grade in the fall. Her current hopes after high school are to become a nun. She denies use of tobacco, ethanol, or illicit substances. She denies every having sexual intercourse.    Review of Systems  Constitutional: Negative.   HENT: Negative.   Eyes: Negative.   Respiratory: Negative.   Cardiovascular: Negative.   Gastrointestinal: Negative.   Genitourinary: Negative.   Musculoskeletal: Negative.   Skin: Negative.   Neurological: Negative.   Endo/Heme/Allergies: Negative.   Psychiatric/Behavioral: Negative.     PHYSICAL EXAMINATION:    BP 108/60 (BP Location: Right Arm, Patient Position: Sitting, Cuff Size: Normal)   Pulse 72   Resp 16   Wt 127 lb (57.6 kg)   LMP 05/21/2017   BMI 21.13 kg/m     General appearance: alert, cooperative and appears stated age Neck: no adenopathy, supple, symmetrical, trachea midline and thyroid normal to inspection and palpation Heart: regular rate and rhythm Lungs: CTAB Abdomen: soft, non-tender; bowel sounds normal; no masses,  no organomegaly Extremities: normal, atraumatic, no cyanosis Skin: normal color, texture and turgor, no rashes or lesions Lymph: normal cervical supraclavicular and inguinal nodes Neurologic: grossly normal   Pelvic: External genitalia:  no lesions, she has a small vaginal opening, perineal skin covering the lower portion of her vagina. Able to insert a cotton swab and my pinky finger into her vagina. The perineal skin is  thin and covering the lower portion of her vagina. She doesn't have an imperforate hymen.               Urethra:  normal appearing urethra with no masses, tenderness or lesions              Bartholins and Skenes: normal                               Chaperone was present for exam.  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 Discussed options of contraception, not a candidate for OCP's She would like to start with the mini-pill, consider the nexplanon (information given) Discussed use of condoms   An After Visit Summary was printed and given to the patient.  ~25 minutes face to face time of which over 50% was spent in counseling.

## 2017-05-27 ENCOUNTER — Telehealth: Payer: Self-pay | Admitting: Obstetrics and Gynecology

## 2017-05-27 NOTE — Telephone Encounter (Signed)
Patient is asking to talk with Thomasene LotSuzy again. No details given.

## 2017-05-27 NOTE — Telephone Encounter (Signed)
Clinical information provided to Pattricia BossAnnie, with precrt company ( call transferred to me from WilsonSuzy in business office.) Pattricia Bossnnie states this information will be provided to medical director to determine if approved and they will call back.

## 2017-05-27 NOTE — Telephone Encounter (Signed)
Judy [name] confirmed with Trinda Pascalnastasia at [employer] that procedure has been approved. Left message on patient voice mail per instructions.

## 2017-05-27 NOTE — Telephone Encounter (Signed)
Returned call to patient. Patient has contacted her insurance company regarding scheduled surgical procedure and the pending prior approval. Patient wanted to advise our office the insurance representative she spoke with advised a pre-certification determination should be completed today by 3:00 PM. Information noted and we will follow up with insurance as well.   cc: Billie RuddySally Yeakley, RN

## 2017-05-28 ENCOUNTER — Ambulatory Visit (HOSPITAL_COMMUNITY): Payer: Commercial Managed Care - PPO | Admitting: Anesthesiology

## 2017-05-28 ENCOUNTER — Ambulatory Visit (HOSPITAL_COMMUNITY)
Admission: RE | Admit: 2017-05-28 | Discharge: 2017-05-28 | Disposition: A | Discharge status: Home / Self Care | Payer: Commercial Managed Care - PPO | Source: Ambulatory Visit | Attending: Obstetrics and Gynecology | Admitting: Obstetrics and Gynecology

## 2017-05-28 ENCOUNTER — Encounter (HOSPITAL_COMMUNITY): Payer: Self-pay | Admitting: *Deleted

## 2017-05-28 ENCOUNTER — Other Ambulatory Visit: Payer: Self-pay

## 2017-05-28 ENCOUNTER — Encounter (HOSPITAL_COMMUNITY)
Admission: RE | Disposition: A | Discharge status: Home / Self Care | Payer: Self-pay | Source: Ambulatory Visit | Attending: Obstetrics and Gynecology

## 2017-05-28 DIAGNOSIS — J45909 Unspecified asthma, uncomplicated: Secondary | ICD-10-CM | POA: Insufficient documentation

## 2017-05-28 DIAGNOSIS — F329 Major depressive disorder, single episode, unspecified: Secondary | ICD-10-CM | POA: Insufficient documentation

## 2017-05-28 DIAGNOSIS — Z79899 Other long term (current) drug therapy: Secondary | ICD-10-CM | POA: Insufficient documentation

## 2017-05-28 DIAGNOSIS — K219 Gastro-esophageal reflux disease without esophagitis: Secondary | ICD-10-CM | POA: Insufficient documentation

## 2017-05-28 DIAGNOSIS — Q5279 Other congenital malformations of vulva: Secondary | ICD-10-CM | POA: Insufficient documentation

## 2017-05-28 DIAGNOSIS — F419 Anxiety disorder, unspecified: Secondary | ICD-10-CM | POA: Insufficient documentation

## 2017-05-28 HISTORY — DX: Acne, unspecified: L70.9

## 2017-05-28 HISTORY — PX: LESION REMOVAL: SHX5196

## 2017-05-28 HISTORY — DX: Gastro-esophageal reflux disease without esophagitis: K21.9

## 2017-05-28 LAB — CBC
HEMATOCRIT: 42.9 % (ref 36.0–46.0)
Hemoglobin: 14.4 g/dL (ref 12.0–15.0)
MCH: 32.3 pg (ref 26.0–34.0)
MCHC: 33.6 g/dL (ref 30.0–36.0)
MCV: 96.2 fL (ref 78.0–100.0)
PLATELETS: 269 10*3/uL (ref 150–400)
RBC: 4.46 MIL/uL (ref 3.87–5.11)
RDW: 12.6 % (ref 11.5–15.5)
WBC: 7.1 10*3/uL (ref 4.0–10.5)

## 2017-05-28 SURGERY — EXCISION, LESION, VAGINA
Anesthesia: General | Site: Vagina

## 2017-05-28 MED ORDER — FENTANYL CITRATE (PF) 100 MCG/2ML IJ SOLN
INTRAMUSCULAR | Status: AC
  Filled 2017-05-28: qty 2

## 2017-05-28 MED ORDER — FENTANYL CITRATE (PF) 100 MCG/2ML IJ SOLN: 25.0000 ug | INTRAMUSCULAR | Status: DC | PRN

## 2017-05-28 MED ORDER — DEXAMETHASONE SODIUM PHOSPHATE 10 MG/ML IJ SOLN
INTRAMUSCULAR | Status: DC | PRN
  Administered 2017-05-28: 10 mg via INTRAVENOUS

## 2017-05-28 MED ORDER — BUPIVACAINE HCL (PF) 0.25 % IJ SOLN
INTRAMUSCULAR | Status: AC
  Filled 2017-05-28: qty 30

## 2017-05-28 MED ORDER — LACTATED RINGERS IV SOLN: INTRAVENOUS | Status: DC

## 2017-05-28 MED ORDER — PROPOFOL 10 MG/ML IV BOLUS
INTRAVENOUS | Status: DC | PRN
Start: 2017-05-28 — End: 2017-05-28
  Administered 2017-05-28: 150 mg via INTRAVENOUS

## 2017-05-28 MED ORDER — MIDAZOLAM HCL 5 MG/5ML IJ SOLN
INTRAMUSCULAR | Status: DC | PRN
  Administered 2017-05-28: 2 mg via INTRAVENOUS

## 2017-05-28 MED ORDER — LIDOCAINE 2% (20 MG/ML) 5 ML SYRINGE
INTRAMUSCULAR | Status: DC | PRN
  Administered 2017-05-28: 100 mg via INTRAVENOUS

## 2017-05-28 MED ORDER — SCOPOLAMINE 1 MG/3DAYS TD PT72
1.0000 | MEDICATED_PATCH | Freq: Once | TRANSDERMAL | Status: DC
  Administered 2017-05-28: 1.5 mg via TRANSDERMAL

## 2017-05-28 MED ORDER — LIDOCAINE HCL 1 % IJ SOLN
INTRAMUSCULAR | Status: AC
  Filled 2017-05-28: qty 20

## 2017-05-28 MED ORDER — DEXAMETHASONE SODIUM PHOSPHATE 10 MG/ML IJ SOLN
INTRAMUSCULAR | Status: AC
  Filled 2017-05-28: qty 1

## 2017-05-28 MED ORDER — LACTATED RINGERS IV SOLN
INTRAVENOUS | Status: DC
  Administered 2017-05-28: 08:00:00 via INTRAVENOUS

## 2017-05-28 MED ORDER — PROPOFOL 10 MG/ML IV BOLUS
INTRAVENOUS | Status: AC
  Filled 2017-05-28: qty 20

## 2017-05-28 MED ORDER — ONDANSETRON HCL 4 MG/2ML IJ SOLN
INTRAMUSCULAR | Status: AC
  Filled 2017-05-28: qty 2

## 2017-05-28 MED ORDER — BUPIVACAINE HCL (PF) 0.25 % IJ SOLN
INTRAMUSCULAR | Status: DC | PRN
  Administered 2017-05-28: 7 mg

## 2017-05-28 MED ORDER — SCOPOLAMINE 1 MG/3DAYS TD PT72
MEDICATED_PATCH | TRANSDERMAL | Status: AC
  Filled 2017-05-28: qty 1

## 2017-05-28 MED ORDER — ONDANSETRON HCL 4 MG/2ML IJ SOLN
INTRAMUSCULAR | Status: DC | PRN
  Administered 2017-05-28: 4 mg via INTRAVENOUS

## 2017-05-28 MED ORDER — KETOROLAC TROMETHAMINE 30 MG/ML IJ SOLN
INTRAMUSCULAR | Status: DC | PRN
  Administered 2017-05-28: 30 mg via INTRAVENOUS

## 2017-05-28 MED ORDER — KETOROLAC TROMETHAMINE 30 MG/ML IJ SOLN
INTRAMUSCULAR | Status: AC
  Filled 2017-05-28: qty 1

## 2017-05-28 MED ORDER — MIDAZOLAM HCL 2 MG/2ML IJ SOLN
INTRAMUSCULAR | Status: AC
  Filled 2017-05-28: qty 2

## 2017-05-28 MED ORDER — FENTANYL CITRATE (PF) 100 MCG/2ML IJ SOLN
INTRAMUSCULAR | Status: DC | PRN
  Administered 2017-05-28: 50 ug via INTRAVENOUS

## 2017-05-28 SURGICAL SUPPLY — 19 items
BLADE SURG 15 STRL LF C SS BP (BLADE) ×1 IMPLANT
BLADE SURG 15 STRL SS (BLADE) ×2
CONTAINER PREFILL 10% NBF 15ML (MISCELLANEOUS) ×3 IMPLANT
ELECT REM PT RETURN 9FT ADLT (ELECTROSURGICAL)
ELECTRODE REM PT RTRN 9FT ADLT (ELECTROSURGICAL) IMPLANT
GLOVE BIOGEL PI IND STRL 7.0 (GLOVE) ×2 IMPLANT
GLOVE BIOGEL PI INDICATOR 7.0 (GLOVE) ×4
GLOVE ECLIPSE 6.5 STRL STRAW (GLOVE) ×3 IMPLANT
GOWN STRL REUS W/TWL LRG LVL3 (GOWN DISPOSABLE) ×6 IMPLANT
NS IRRIG 1000ML POUR BTL (IV SOLUTION) ×3 IMPLANT
PACK VAGINAL MINOR WOMEN LF (CUSTOM PROCEDURE TRAY) ×3 IMPLANT
PAD OB MATERNITY 4.3X12.25 (PERSONAL CARE ITEMS) ×3 IMPLANT
PAD PREP 24X48 CUFFED NSTRL (MISCELLANEOUS) ×3 IMPLANT
PENCIL BUTTON HOLSTER BLD 10FT (ELECTRODE) IMPLANT
SUT VICRYL 4-0 PS2 18IN ABS (SUTURE) ×3 IMPLANT
TOWEL OR 17X24 6PK STRL BLUE (TOWEL DISPOSABLE) ×6 IMPLANT
TUBING CONNECTING 10 (TUBING) ×2 IMPLANT
TUBING CONNECTING 10' (TUBING) ×1
YANKAUER SUCT BULB TIP NO VENT (SUCTIONS) ×3 IMPLANT

## 2017-05-28 NOTE — Interval H&P Note (Signed)
History and Physical Interval Note:  05/28/2017 8:23 AM  Judy [name]  has presented today for surgery, with the diagnosis of hymenal remnant  The various methods of treatment have been discussed with the patient and family. After consideration of risks, benefits and other options for treatment, the patient has consented to  Procedure(s): EXCISION VAGINAL LESION  excision hymenal remnant (N/A) as a surgical intervention .  The patient's history has been reviewed, patient examined, no change in status, stable for surgery.  I have reviewed the patient's chart and labs.  Questions were answered to the patient's satisfaction.     Romualdo BolkJill Evelyn Jertson

## 2017-05-28 NOTE — Anesthesia Procedure Notes (Signed)
Procedure Name: LMA Insertion Date/Time: 05/28/2017 8:32 AM Performed by: Vista LawmanEargle, Massiel Stipp E, CRNA Pre-anesthesia Checklist: Patient identified, Emergency Drugs available, Suction available, Patient being monitored and Timeout performed Patient Re-evaluated:Patient Re-evaluated prior to induction Oxygen Delivery Method: Circle system utilized Preoxygenation: Pre-oxygenation with 100% oxygen Induction Type: IV induction LMA: LMA inserted LMA Size: 3.0 Number of attempts: 1 Placement Confirmation: positive ETCO2,  CO2 detector and breath sounds checked- equal and bilateral Tube secured with: Tape Dental Injury: Teeth and Oropharynx as per pre-operative assessment

## 2017-05-28 NOTE — Anesthesia Preprocedure Evaluation (Addendum)
Anesthesia Evaluation  Patient identified by MRN, date of birth, ID band  Reviewed: Allergy & Precautions, NPO status , Patient's Chart, lab work & pertinent test results  Airway Mallampati: II  TM Distance: >3 FB     Dental   Pulmonary asthma ,    breath sounds clear to auscultation       Cardiovascular negative cardio ROS   Rhythm:Regular Rate:Normal     Neuro/Psych    GI/Hepatic Neg liver ROS, GERD  ,  Endo/Other  negative endocrine ROS  Renal/GU negative Renal ROS     Musculoskeletal   Abdominal   Peds  Hematology   Anesthesia Other Findings   Reproductive/Obstetrics                             Anesthesia Physical Anesthesia Plan  ASA: III  Anesthesia Plan: General   Post-op Pain Management:    Induction: Intravenous  PONV Risk Score and Plan: 3 and Midazolam, Ondansetron, Dexamethasone and Treatment may vary due to age or medical condition  Airway Management Planned: LMA  Additional Equipment:   Intra-op Plan:   Post-operative Plan: Extubation in OR  Informed Consent: I have reviewed the patients History and Physical, chart, labs and discussed the procedure including the risks, benefits and alternatives for the proposed anesthesia with the patient or authorized representative who has indicated his/her understanding and acceptance.   Dental advisory given  Plan Discussed with: CRNA and Anesthesiologist  Anesthesia Plan Comments:         Anesthesia Quick Evaluation

## 2017-05-28 NOTE — Discharge Instructions (Signed)

## 2017-05-28 NOTE — Transfer of Care (Signed)
Immediate Anesthesia Transfer of Care Note  Patient: Judy [name]  Procedure(s) Performed: PLASTICS REPAIR OF INTROIDUS (N/A Vagina )  Patient Location: PACU  Anesthesia Type:General  Level of Consciousness: drowsy and patient cooperative  Airway & Oxygen Therapy: Patient Spontanous Breathing and Patient connected to nasal cannula oxygen  Post-op Assessment: Report given to RN and Post -op Vital signs reviewed and stable  Post vital signs: Reviewed and stable  Last Vitals:  Vitals:   05/28/17 0741  BP: 103/67  Pulse: 68  Resp: 16  Temp: 36.7 C  SpO2: 100%    Last Pain:  Vitals:   05/28/17 0741  TempSrc: Oral      Patients Stated Pain Goal: 4 (05/28/17 0741)  Complications: No apparent anesthesia complications

## 2017-05-28 NOTE — Op Note (Signed)
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   Surgeon: Dr Gertie ExonJill Matas Burrows  Assistants: None  Anesthesia: Plastics repair of the introitus  EBL: 5 cc  Fluids: 500 cc  Urine output: not recorded  Indications for surgery: The patient is a 21 yo female, who presented with the inability to have intercourse or comfortably use a tampon. Exam revealed that her perineal skin was covering 90% of her introitus. Ultrasound revealed normal pelvic anatomy.  The risks of the surgery were reviewed with the patient and the consent form was signed prior to her surgery.  Findings: The perineal skin covered the majority of the introitus, once the repair was done, she had a normal vagina and normal cervix. Bimanual exam with normal sized, mobile uterus, no adnexal masses.   Specimens: None   Procedure: The patient was taken to the operating room with an IV in place. She was placed in the dorsal lithotomy position and anesthesia was administered. She was prepped and draped in the usual sterile fashion for a vaginal procedure. Cotton swabs saturated with betadine were used to clean the vagina.   The perineal skin was stretched open with a snap clamp and the area was injected with 0.25% marcaine. A #15 blade was used to incise through the perineal skin vertically to below the hymen/introitus. The perineal skin was sutured to the vaginal mucosa with a running subcuticular stitch with 0-Vicryl. Hemostasis was excellent. At this point a small speculum could be easily passed into the vagina and the cervix was visualized.   The perineum was cleansed with betadine and the patient was taken out of the dorsal lithotomy position.   The sponge and instrument count were correct. There were no complications.

## 2017-05-28 NOTE — Telephone Encounter (Signed)
Pre-certification for surgery scheduled for 05/28/17 has been authorized, see account notes for details. Patient is aware of approval. Ok to close   cc: Dr Oscar LaJertson

## 2017-05-28 NOTE — Anesthesia Postprocedure Evaluation (Signed)
Anesthesia Post Note  Patient: Judy [name]  Procedure(s) Performed: PLASTICS REPAIR OF INTROIDUS (N/A Vagina )     Patient location during evaluation: PACU Anesthesia Type: General Level of consciousness: awake Pain management: pain level controlled Vital Signs Assessment: post-procedure vital signs reviewed and stable Respiratory status: spontaneous breathing Cardiovascular status: stable Anesthetic complications: no    Last Vitals:  Vitals:   05/28/17 1015 05/28/17 1025  BP: 98/66   Pulse: (!) 57   Resp: 12   Temp:  37.1 C  SpO2: 100%     Last Pain:  Vitals:   05/28/17 1025  TempSrc: Oral   Pain Goal: Patients Stated Pain Goal: 4 (05/28/17 0741)               Kalani Baray

## 2017-05-28 NOTE — Interval H&P Note (Signed)
History and Physical Interval Note:  05/28/2017 8:25 AM  Judy [name]  has presented today for surgery, with the diagnosis of hymenal remnant  The various methods of treatment have been discussed with the patient and family. After consideration of risks, benefits and other options for treatment, the patient has consented to  Procedure(s): EXCISION VAGINAL LESION  excision hymenal remnant (N/A) as a surgical intervention .  The patient's history has been reviewed, patient examined, no change in status, stable for surgery.  I have reviewed the patient's chart and labs.  Questions were answered to the patient's satisfaction.     Romualdo BolkJill Evelyn Tyrell Brereton

## 2017-05-28 NOTE — Telephone Encounter (Signed)
Patient mother called and left voicemail that they spoke to the insurance company and her surgery has been approved.

## 2017-05-29 ENCOUNTER — Telehealth: Payer: Self-pay | Admitting: Obstetrics and Gynecology

## 2017-05-29 ENCOUNTER — Encounter (HOSPITAL_COMMUNITY): Payer: Self-pay | Admitting: Obstetrics and Gynecology

## 2017-05-29 NOTE — Telephone Encounter (Signed)
Called to check on the patient, s/p perineal repair yesterday. She is doing well, slightly sore. Will call with any concerns, otherwise f/u next week.

## 2017-05-30 ENCOUNTER — Telehealth: Payer: Self-pay | Admitting: Obstetrics and Gynecology

## 2017-05-30 NOTE — Telephone Encounter (Signed)
TC returned.  Pt is 2 days s/p labial surgery.  C/O nausea without vomiting and feeling dizzy sometimes  She denies fever, chills and has eaten each meal without difficulty.  She denies vulvar pain or vaginal bleeding. 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   I recommended she call Dr. Salli QuarryJertson's office in am for a work excuse.  She is encouraged to call if she has further problems.

## 2017-05-31 ENCOUNTER — Telehealth: Payer: Self-pay | Admitting: *Deleted

## 2017-05-31 ENCOUNTER — Ambulatory Visit: Payer: Commercial Managed Care - PPO | Admitting: Family Medicine

## 2017-05-31 ENCOUNTER — Encounter: Payer: Self-pay | Admitting: Family Medicine

## 2017-05-31 ENCOUNTER — Encounter: Payer: Self-pay | Admitting: Obstetrics and Gynecology

## 2017-05-31 ENCOUNTER — Ambulatory Visit (INDEPENDENT_AMBULATORY_CARE_PROVIDER_SITE_OTHER): Payer: Commercial Managed Care - PPO | Admitting: Obstetrics and Gynecology

## 2017-05-31 ENCOUNTER — Other Ambulatory Visit: Payer: Self-pay

## 2017-05-31 VITALS — BP 98/63 | HR 118 | Temp 97.7°F | Wt 125.0 lb

## 2017-05-31 VITALS — BP 110/66 | HR 70 | Temp 98.0°F | Ht 65.0 in | Wt 125.6 lb

## 2017-05-31 DIAGNOSIS — Z87898 Personal history of other specified conditions: Secondary | ICD-10-CM

## 2017-05-31 DIAGNOSIS — R110 Nausea: Secondary | ICD-10-CM | POA: Diagnosis not present

## 2017-05-31 DIAGNOSIS — I951 Orthostatic hypotension: Secondary | ICD-10-CM | POA: Diagnosis not present

## 2017-05-31 DIAGNOSIS — R42 Dizziness and giddiness: Secondary | ICD-10-CM

## 2017-05-31 DIAGNOSIS — R5383 Other fatigue: Secondary | ICD-10-CM

## 2017-05-31 LAB — POCT CBC
Granulocyte percent: 70.8 %G (ref 37–80)
HCT, POC: 44.4 % (ref 37.7–47.9)
Hemoglobin: 15.2 g/dL (ref 12.2–16.2)
Lymph, poc: 2.5 (ref 0.6–3.4)
MCH, POC: 31.7 pg — AB (ref 27–31.2)
MCHC: 34.2 g/dL (ref 31.8–35.4)
MCV: 92.7 fL (ref 80–97)
MID (cbc): 0.7 (ref 0–0.9)
MPV: 6.6 fL (ref 0–99.8)
POC Granulocyte: 7.8 — AB (ref 2–6.9)
POC LYMPH PERCENT: 22.4 %L (ref 10–50)
POC MID %: 6.8 %M (ref 0–12)
Platelet Count, POC: 333 10*3/uL (ref 142–424)
RBC: 4.79 M/uL (ref 4.04–5.48)
RDW, POC: 12.5 %
WBC: 11 10*3/uL — AB (ref 4.6–10.2)

## 2017-05-31 LAB — POCT URINALYSIS DIP (MANUAL ENTRY)
Bilirubin, UA: NEGATIVE
Blood, UA: NEGATIVE
Glucose, UA: NEGATIVE mg/dL
Ketones, POC UA: NEGATIVE mg/dL
Leukocytes, UA: NEGATIVE
Nitrite, UA: NEGATIVE
Protein Ur, POC: NEGATIVE mg/dL
Spec Grav, UA: <=1.005 — AB (ref 1.010–1.025)
Urobilinogen, UA: 0.2 E.U./dL
pH, UA: 7.5 (ref 5.0–8.0)

## 2017-05-31 NOTE — Telephone Encounter (Signed)
Kennon RoundsSally, Please call and check on the patient. See if you think she needs to be seen now. Okay to give her a letter for work.

## 2017-05-31 NOTE — Progress Notes (Signed)
dg 

## 2017-05-31 NOTE — Progress Notes (Signed)
GYNECOLOGY  VISIT   HPI: 21 y.o.   Single  Caucasian  female   G0P0000 with Patient's last menstrual period was 05/21/2017 (exact date).   here for 3 day post PLASTICS REPAIR OF INTROIDUS (N/A Vagina ).  Patient complaining of nausea, fatigue and dizziness. Patient denies any temperature.  She feels weak and tired. She feels sore on her vulva, mild, she doesn't feel infected. Able to void okay. No urinary frequency, urgency or pain.  Her sister has mono.      GYNECOLOGIC HISTORY: Patient's last menstrual period was 05/21/2017 (exact date). Contraception:  Condoms Menopausal hormone therapy: n/a        OB History    Gravida Para Term Preterm AB Living   0 0 0 0 0 0   SAB TAB Ectopic Multiple Live Births   0 0 0 0 0         Patient Active Problem List   Diagnosis Date Noted  . 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 Costochondritis 02/04/2014  . Unspecified asthma(493.90) 04/20/2013  . Cat allergies 04/20/2013  . Seasonal allergies 04/20/2013    Past Medical History:  Diagnosis Date  . Acne    tx with spironolactone  . Allergy    Has allergies to dogs and cats.  . Anxiety    no meds  . Asthma    as a child, rarely uses inhaler  . Cat allergies 04/20/2013  . Depression    no meds  . GERD (gastroesophageal reflux disease)    occasional  . Migraine headache with aura    last one 04/2017, triggers lack of sleep-otc prn    Past Surgical History:  Procedure Laterality Date  . LESION REMOVAL N/A 05/28/2017   Procedure: PLASTICS REPAIR OF INTROIDUS;  Surgeon: Romualdo BolkJertson, Ronnett Pullin Evelyn, MD;  Location: [facility];  Service: Gynecology;  Laterality: N/A;  . WISDOM TOOTH EXTRACTION      Current Outpatient Medications  Medication Sig Dispense Refill  . albuterol (PROVENTIL HFA;VENTOLIN HFA) 108 (90 BASE) MCG/ACT inhaler Inhale 2 puffs into the lungs every 6 (six) hours as needed for wheezing (One for school and one for home). 2 Inhaler 3  . cetirizine (ZYRTEC) 10 MG tablet  Take 10 mg by mouth daily as needed (for allergies.).     Marland Kitchen. chlorpheniramine (CHLOR-TRIMETON) 4 MG tablet Take 4 mg by mouth every 4 (four) hours as needed for allergies.    . famotidine (PEPCID AC) 10 MG chewable tablet Chew 10 mg by mouth daily as needed for heartburn.    . fluticasone (FLONASE) 50 MCG/ACT nasal spray Place 1 spray into both nostrils 2 (two) times daily as needed for allergies or rhinitis.    Marland Kitchen. ibuprofen (ADVIL,MOTRIN) 200 MG tablet Take 400-600 mg by mouth 2 (two) times daily as needed for headache or moderate pain.    . Melatonin 5 MG TABS Take 5 mg by mouth at bedtime as needed (for sleep.).    . Naphazoline-Pheniramine (OPCON-A) 0.027-0.315 % SOLN Place 1 drop into both eyes 3 (three) times daily as needed (for irritated/itchy eyes.).    Marland Kitchen. norethindrone (MICRONOR,CAMILA,ERRIN) 0.35 MG tablet Take 1 tablet (0.35 mg total) by mouth daily. (Patient taking differently: Take 1 tablet by mouth at bedtime. ) 3 Package 3  . spironolactone (ALDACTONE) 25 MG tablet Take 25 mg by mouth at bedtime.      No current facility-administered medications for this visit.      ALLERGIES: Pollen extract  Family History  Problem Relation  Age of Onset  . Hypertension Mother   . Hyperlipidemia Mother   . Scoliosis Mother   . Lupus Maternal Grandmother   . Osteoporosis Maternal Grandmother   . Heart disease Maternal Grandfather   . Hypertension Maternal Grandfather   . Diabetes Maternal Grandfather   . Emphysema Maternal Grandfather   . Heart attack Maternal Grandfather        occurred < 19 years old  . Hyperlipidemia Maternal Grandfather   . COPD Maternal Grandfather   . Cancer Maternal Grandfather        skin cancer  . Osteoarthritis Paternal Grandmother   . Diabetes Paternal Grandfather   . Heart disease Paternal Grandfather   . Kidney failure Paternal Grandfather        2 transplants due to DM  . Hypertension Paternal Grandfather   . Hyperlipidemia Paternal Grandfather   .  Stroke Paternal Grandfather   . Parkinson's disease Paternal Grandfather     Social History   Socioeconomic History  . Marital status: Single    Spouse name: Not on file  . Number of children: Not on file  . Years of education: Not on file  . Highest education level: Not on file  Social Needs  . Financial resource strain: Not on file  . Food insecurity - worry: Not on file  . Food insecurity - inability: Not on file  . Transportation needs - medical: Not on file  . Transportation needs - non-medical: Not on file  Occupational History  . Not on file  Tobacco Use  . Smoking status: Never Smoker  . Smokeless tobacco: Never Used  Substance and Sexual Activity  . Alcohol use: No  . Drug use: No  . Sexual activity: Yes    Birth control/protection: Condom, Pill    Comment: female/female partner  Other Topics Concern  . Not on file  Social History Narrative   She lives at home with her parents and older sister. Family moved from [place] in Jan. 2014. She will be going into 10th grade in the fall. Her current hopes after high school are to become a nun. She denies use of tobacco, ethanol, or illicit substances. She denies every having sexual intercourse.    ROS  PHYSICAL EXAMINATION:    BP 110/66 (BP Location: Right Arm, Patient Position: Sitting, Cuff Size: Normal)   Pulse 70   Ht 5\' 5"  (1.651 m)   Wt 125 lb 9.6 oz (57 kg)   LMP 05/21/2017 (Exact Date)   BMI 20.90 kg/m     General appearance: alert, cooperative and appears stated age Neck: no adenopathy, supple, symmetrical, trachea midline and thyroid normal to inspection and palpation  Pelvic: External genitalia:  no lesions, suture line healing well. No erythema, no induration, no drainage.                Chaperone was present for exam.  ASSESSMENT 3 days s/p plastics repair of introitus. She is fatigued, weak, some nausea. No signs of infection. Her sister has mono.    PLAN Suspect she may have a viral  illness, recommended she f/u with her primary.    An After Visit Summary was printed and given to the patient.

## 2017-05-31 NOTE — Telephone Encounter (Signed)
Letter pending per Dr. Oscar LaJertson approval.

## 2017-05-31 NOTE — Telephone Encounter (Signed)
Spoke with patient. S/P surgery 12/18 plastics repair of introidus.   Patient states she called after hours physician, Dr. Pennie RushingHaygood, on 12/20 with c/o nausea and weakness. 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 Requesting work note for 12/21 and 12/22.   Reports nausea is improved today, some weakness. Tolerating food. Denies vomiting, fever/chills, pain, or bleeding.   Advised patient will review with Dr. Oscar LaJertson and return call. Advised patient Dr. Oscar LaJertson is out of the office, d/t holidays office will not reopen on 12/26. Patient states she will pick up letter from office on 12/26 if Dr. Oscar LaJertson agreeable.   Dr. Oscar LaJertson -ok to proceed with note for work?

## 2017-05-31 NOTE — Telephone Encounter (Signed)
Call to patient. Sates she still feeling very tired but nausea and dizziness improved.  Denies increased pain, swelling, or drainage at surgical site. States she had white vaginal discharge but unsure if this is normal discharge that she just wasn't able to see prior to surgery. Denies fever. Discussed that due to holiday, limited opportunity to recheck patient before closing. Symptoms sound within normal post op course but if desires to be check, would need to be seen now. Patient requests to come in now for recheck.   Dr Oscar LaJertson updated.  Encounter closed.

## 2017-05-31 NOTE — Progress Notes (Signed)
12/21/20183:31 PM  Berta MinorChloe Willadsen 06-24-1995, 21 y.o. female [id number]  Chief Complaint  Patient presents with  . Nausea    with vertigo. Just had perineal repair on Tuesday morning. Feeling dizziness due to the anesthesia    HPI:   Patient is a 21 y.o. female with past medical history significant for recent Gyn surgery who presents today for nausea, fatigue and dizziness. She was seen this morning by Gyn for post-op visit and is doing well from that standpoint. She has a sister that was recently diagnosed with mono. Patient denies sore throat. She states that she has been feeling dizzy, off balance when she goes from lying down to sitting and feels drained. She has been trying to eat and drink more. She denies any fever, chills, night sweats, swollen glands, URI sx, vomiting, abdominal pain, diarrhea, constipation, dysuria or urinary frequency/urgency.  Depression screen [facility] 2/9 05/31/2017  Decreased Interest 0  Down, Depressed, Hopeless 0  PHQ - 2 Score 0    Allergies  Allergen Reactions  . Pollen Extract Other (See Comments)    Sneezing/itchy eyes/runny nose    Prior to Admission medications   Medication Sig Start Date End Date Taking? Authorizing Provider  albuterol (PROVENTIL HFA;VENTOLIN HFA) 108 (90 BASE) MCG/ACT inhaler Inhale 2 puffs into the lungs every 6 (six) hours as needed for wheezing (One for school and one for home). 03/01/14  Yes Perez-Fiery, Angelique Blonderenise, MD  cetirizine (ZYRTEC) 10 MG tablet Take 10 mg by mouth daily as needed (for allergies.).    Yes [provider]  chlorpheniramine (CHLOR-TRIMETON) 4 MG tablet Take 4 mg by mouth every 4 (four) hours as needed for allergies.   Yes [provider]  famotidine (PEPCID AC) 10 MG chewable tablet Chew 10 mg by mouth daily as needed for heartburn.   Yes [provider]  fluticasone (FLONASE) 50 MCG/ACT nasal spray Place 1 spray into both nostrils 2 (two) times daily as needed for allergies or  rhinitis.   Yes [provider]  ibuprofen (ADVIL,MOTRIN) 200 MG tablet Take 400-600 mg by mouth 2 (two) times daily as needed for headache or moderate pain.   Yes [provider]  Melatonin 5 MG TABS Take 5 mg by mouth at bedtime as needed (for sleep.).   Yes [provider]  Naphazoline-Pheniramine (OPCON-A) 0.027-0.315 % SOLN Place 1 drop into both eyes 3 (three) times daily as needed (for irritated/itchy eyes.).   Yes [provider]  norethindrone (MICRONOR,CAMILA,ERRIN) 0.35 MG tablet Take 1 tablet (0.35 mg total) by mouth daily. Patient taking differently: Take 1 tablet by mouth at bedtime.  05/23/17  Yes Romualdo BolkJertson, Jill Evelyn, MD  spironolactone (ALDACTONE) 25 MG tablet Take 25 mg by mouth at bedtime.    Yes [provider]    Past Medical History:  Diagnosis Date  . Acne    tx with spironolactone  . Allergy    Has allergies to dogs and cats.  . Anxiety    no meds  . Asthma    as a child, rarely uses inhaler  . Cat allergies 04/20/2013  . Depression    no meds  . GERD (gastroesophageal reflux disease)    occasional  . Migraine headache with aura    last one 04/2017, triggers lack of sleep-otc prn    Past Surgical History:  Procedure Laterality Date  . LESION REMOVAL N/A 05/28/2017   Procedure: PLASTICS REPAIR OF INTROIDUS;  Surgeon: Romualdo BolkJertson, Jill Evelyn, MD;  Location: [facility];  Service: Gynecology;  Laterality: N/A;  . WISDOM TOOTH EXTRACTION      Social History   Tobacco Use  . Smoking status: Never Smoker  . Smokeless tobacco: Never Used  Substance Use Topics  . Alcohol use: No    Family History  Problem Relation Age of Onset  . Hypertension Mother   . Hyperlipidemia Mother   . Scoliosis Mother   . Lupus Maternal Grandmother   . Osteoporosis Maternal Grandmother   . Heart disease Maternal Grandfather   . Hypertension Maternal Grandfather   . Diabetes Maternal Grandfather   . Emphysema Maternal Grandfather   .  Heart attack Maternal Grandfather        occurred < 48 years old  . Hyperlipidemia Maternal Grandfather   . COPD Maternal Grandfather   . Cancer Maternal Grandfather        skin cancer  . Osteoarthritis Paternal Grandmother   . Diabetes Paternal Grandfather   . Heart disease Paternal Grandfather   . Kidney failure Paternal Grandfather        2 transplants due to DM  . Hypertension Paternal Grandfather   . Hyperlipidemia Paternal Grandfather   . Stroke Paternal Grandfather   . Parkinson's disease Paternal Grandfather     ROS Per hpi  OBJECTIVE:  Blood pressure 98/63, pulse (!) 118, temperature 97.7 F (36.5 C), temperature source Oral, weight 125 lb (56.7 kg), last menstrual period 05/21/2017, SpO2 98 %.  Orthostatic VS for the past 24 hrs:  BP- Lying Pulse- Lying BP- Sitting Pulse- Sitting BP- Standing at 0 minutes Pulse- Standing at 0 minutes  05/31/17 1556 92/58 71 102/69 91 102/73 123     Physical Exam  Constitutional: She is oriented to person, place, and time and well-developed, well-nourished, and in no distress.  HENT:  Head: Normocephalic and atraumatic.  Right Ear: Hearing, tympanic membrane, external ear and ear canal normal.  Left Ear: Hearing, tympanic membrane, external ear and ear canal normal.  Mouth/Throat: Oropharynx is clear and moist.  Eyes: EOM are normal. Pupils are equal, round, and reactive to light.  Neck: Neck supple. No thyromegaly present.  Cardiovascular: Normal rate, regular rhythm, normal heart sounds and intact distal pulses. Exam reveals no gallop and no friction rub.  No murmur heard. Pulmonary/Chest: Effort normal and breath sounds normal. She has no wheezes. She has no rales.  Abdominal: Soft. Bowel sounds are normal. She exhibits no distension and no mass. There is no tenderness.  Musculoskeletal: Normal range of motion. She exhibits no edema.  Lymphadenopathy:    She has no cervical adenopathy.  Neurological: She is alert and  oriented to person, place, and time. She has normal reflexes. Gait normal.  Skin: Skin is warm and dry. There is pallor.  Psychiatric: Mood and affect normal.  Nursing note and vitals reviewed.    Results for orders placed or performed in visit on 05/31/17 (from the past 24 hour(s))  POCT urinalysis dipstick     Status: Abnormal   Collection Time: 05/31/17  3:05 PM  Result Value Ref Range   Color, UA yellow yellow   Clarity, UA clear clear   Glucose, UA negative negative mg/dL   Bilirubin, UA negative negative   Ketones, POC UA negative negative mg/dL   Spec Grav, UA <=9.562 (A) 1.010 - 1.025   Blood, UA negative negative   pH, UA 7.5 5.0 - 8.0   Protein Ur, POC negative negative mg/dL   Urobilinogen, UA 0.2 0.2 or 1.0 E.U./dL  Nitrite, UA Negative Negative   Leukocytes, UA Negative Negative  POCT CBC     Status: Abnormal   Collection Time: 05/31/17  3:57 PM  Result Value Ref Range   WBC 11.0 (A) 4.6 - 10.2 K/uL   Lymph, poc 2.5 0.6 - 3.4   POC LYMPH PERCENT 22.4 10 - 50 %L   MID (cbc) 0.7 0 - 0.9   POC MID % 6.8 0 - 12 %M   POC Granulocyte 7.8 (A) 2 - 6.9   Granulocyte percent 70.8 37 - 80 %G   RBC 4.79 4.04 - 5.48 M/uL   Hemoglobin 15.2 12.2 - 16.2 g/dL   HCT, POC 41.344.4 24.437.7 - 47.9 %   MCV 92.7 80 - 97 fL   MCH, POC 31.7 (A) 27 - 31.2 pg   MCHC 34.2 31.8 - 35.4 g/dL   RDW, POC 01.012.5 %   Platelet Count, POC 333 142 - 424 K/uL   MPV 6.6 0 - 99.8 fL    ASSESSMENT and PLAN  1. Orthostatic hypotension Discussed pushing fluids, increasing salt intake, routine precautions regarding rapid movement.   2. Dizziness - POCT CBC - Orthostatic vital signs  3. Nausea without vomiting - POCT urinalysis dipstick  Return if symptoms worsen or fail to improve.    Myles LippsIrma M Santiago, MD Primary Care at [facility] [address] [place], [place] [address] Ph.  [phone] Fax [phone]

## 2017-05-31 NOTE — Patient Instructions (Addendum)
IF you received an x-ray today, you will receive an invoice from [facility]. Please contact [facility] at [phone] with questions or concerns regarding your invoice.   IF you received labwork today, you will receive an invoice from [facility]. Please contact [facility] at [phone] with questions or concerns regarding your invoice.   Our billing staff will not be able to assist you with questions regarding bills from these companies.  You will be contacted with the lab results as soon as they are available. The fastest way to get your results is to activate your My Chart account. Instructions are located on the last page of this paperwork. If you have not heard from us regarding the results in 2 weeks, please contact this office.        IF you received an x-ray today, you will receive an invoice from [facility]. Please contact [facility] at [phone] with questions or concerns regarding your invoice.   IF you received labwork today, you will receive an invoice from [facility]. Please contact [facility] at [phone] with questions or concerns regarding your invoice.   Our billing staff will not be able to assist you with questions regarding bills from these companies.  You will be contacted with the lab results as soon as they are available. The fastest way to get your results is to activate your My Chart account. Instructions are located on the last page of this paperwork. If you have not heard from us regarding the results in 2 weeks, please contact this office.     Orthostatic Hypotension Orthostatic hypotension is a sudden drop in blood pressure that happens when you quickly change positions, such as when you get up from a seated or lying position. Blood pressure is a measurement of how strongly, or weakly, your blood is pressing against the walls of your arteries. Arteries are blood vessels that carry blood from your heart throughout  your body. When blood pressure is too low, you may not get enough blood to your brain or to the rest of your organs. This can cause weakness, light-headedness, rapid heartbeat, and fainting. This can last for just a few seconds or for up to a few minutes. Orthostatic hypotension is usually not a serious problem. However, if it happens frequently or gets worse, it may be a sign of something more serious. What are the causes? This condition may be caused by:  Sudden changes in posture, such as standing up quickly after you have been sitting or lying down.  Blood loss.  Loss of body fluids (dehydration).  Heart problems.  Hormone (endocrine) problems.  Pregnancy.  Severe infection.  Lack of certain nutrients.  Severe allergic reactions (anaphylaxis).  Certain medicines, such as blood pressure medicine or medicines that make the body lose excess fluids (diuretics). Sometimes, this condition can be caused by not taking medicine as directed, such as taking too much of a certain medicine.  What increases the risk? Certain factors can make you more likely to develop orthostatic hypotension, including:  Age. Risk increases as you get older.  Conditions that affect the heart or the central nervous system.  Taking certain medicines, such as blood pressure medicine or diuretics.  Being pregnant.  What are the signs or symptoms? Symptoms of this condition may include:  Weakness.  Light-headedness.  Dizziness.  Blurred vision.  Fatigue.  Rapid heartbeat.  Fainting, in severe cases.  How is this diagnosed? This condition is diagnosed based on:  Your medical history.  Your symptoms.  Your blood pressure measurement. Your health care provider will check your blood pressure when you are: ? Lying down. ? Sitting. ? Standing.  A blood pressure reading is recorded as two numbers, such as "120 over 80" (or 120/80). The first ("top") number is called the systolic pressure.  It is a measure of the pressure in your arteries as your heart beats. The second ("bottom") number is called the diastolic pressure. It is a measure of the pressure in your arteries when your heart relaxes between beats. Blood pressure is measured in a unit called mm Hg. Healthy blood pressure for adults is 120/80. If your blood pressure is below 90/60, you may be diagnosed with hypotension. Other information or tests that may be used to diagnose orthostatic hypotension include:  Your other vital signs, such as your heart rate and temperature.  Blood tests.  Tilt table test. For this test, you will be safely secured to a table that moves you from a lying position to an upright position. Your heart rhythm and blood pressure will be monitored during the test.  How is this treated? Treatment for this condition may include:  Changing your diet. This may involve eating more salt (sodium) or drinking more water.  Taking medicines to raise your blood pressure.  Changing the dosage of certain medicines you are taking that might be lowering your blood pressure.  Wearing compression stockings. These stockings help to prevent blood clots and reduce swelling in your legs.  In some cases, you may need to go to the hospital for:  Fluid replacement. This means you will receive fluids through an IV tube.  Blood replacement. This means you will receive donated blood through an IV tube (transfusion).  Treating an infection or heart problems, if this applies.  Monitoring. You may need to be monitored while medicines that you are taking wear off.  Follow these instructions at home: Eating and drinking   Drink enough fluid to keep your urine clear or pale yellow.  Eat a healthy diet and follow instructions from your health care provider about eating or drinking restrictions. A healthy diet includes: ? Fresh fruits and vegetables. ? Whole grains. ? Lean meats. ? Low-fat dairy products.  Eat extra  salt only as directed. Do not add extra salt to your diet unless your health care provider told you to do that.  Eat frequent, small meals.  Avoid standing up suddenly after eating. Medicines  Take over-the-counter and prescription medicines only as told by your health care provider. ? Follow instructions from your health care provider about changing the dosage of your current medicines, if this applies. ? Do not stop or adjust any of your medicines on your own. General instructions  Wear compression stockings as told by your health care provider.  Get up slowly from lying down or sitting positions. This gives your blood pressure a chance to adjust.  Avoid hot showers and excessive heat as directed by your health care provider.  Return to your normal activities as told by your health care provider. Ask your health care provider what activities are safe for you.  Do not use any products that contain nicotine or tobacco, such as cigarettes and e-cigarettes. If you need help quitting, ask your health care provider.  Keep all follow-up visits as told by your health care provider. This is important. Contact a health care provider if:  You vomit.  You have diarrhea.  You have a fever for more than 2-3  days.  You feel more thirsty than usual.  You feel weak and tired. Get help right away if:  You have chest pain.  You have a fast or irregular heartbeat.  You develop numbness in any part of your body.  You cannot move your arms or your legs.  You have trouble speaking.  You become sweaty or feel lightheaded.  You faint.  You feel short of breath.  You have trouble staying awake.  You feel confused. This information is not intended to replace advice given to you by your health care provider. Make sure you discuss any questions you have with your health care provider. Document Released: 05/18/2002 Document Revised: 02/14/2016 Document Reviewed: 11/18/2015 Elsevier  Interactive Patient Education  2018 [employer].

## 2017-06-05 ENCOUNTER — Encounter: Payer: Self-pay | Admitting: Family Medicine

## 2017-06-12 ENCOUNTER — Other Ambulatory Visit: Payer: Self-pay

## 2017-06-12 ENCOUNTER — Encounter: Payer: Self-pay | Admitting: Obstetrics and Gynecology

## 2017-06-12 ENCOUNTER — Ambulatory Visit (INDEPENDENT_AMBULATORY_CARE_PROVIDER_SITE_OTHER): Payer: Commercial Managed Care - PPO | Admitting: Obstetrics and Gynecology

## 2017-06-12 VITALS — BP 102/60 | HR 84 | Resp 14 | Wt 127.0 lb

## 2017-06-12 DIAGNOSIS — Z9889 Other specified postprocedural states: Secondary | ICD-10-CM

## 2017-06-12 NOTE — Progress Notes (Signed)
GYNECOLOGY  VISIT   HPI: 22 y.o.   Single  Caucasian  female   G0P0000 with Patient's last menstrual period was 05/21/2017 (exact date).   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 She is doing well, no pain, feels slight itching when she washes.   GYNECOLOGIC HISTORY: Patient's last menstrual period was 05/21/2017 (exact date). Contraception:OCP Menopausal hormone therapy: none         OB History    Gravida Para Term Preterm AB Living   0 0 0 0 0 0   SAB TAB Ectopic Multiple Live Births   0 0 0 0 0         Patient Active Problem List   Diagnosis Date Noted  . 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 Costochondritis 02/04/2014  . Unspecified asthma(493.90) 04/20/2013  . Cat allergies 04/20/2013  . Seasonal allergies 04/20/2013    Past Medical History:  Diagnosis Date  . Acne    tx with spironolactone  . Allergy    Has allergies to dogs and cats.  . Anxiety    no meds  . Asthma    as a child, rarely uses inhaler  . Cat allergies 04/20/2013  . Depression    no meds  . GERD (gastroesophageal reflux disease)    occasional  . Migraine headache with aura    last one 04/2017, triggers lack of sleep-otc prn    Past Surgical History:  Procedure Laterality Date  . LESION REMOVAL N/A 05/28/2017   Procedure: PLASTICS REPAIR OF INTROIDUS;  Surgeon: Romualdo Bolk, MD;  Location: [facility];  Service: Gynecology;  Laterality: N/A;  . WISDOM TOOTH EXTRACTION      Current Outpatient Medications  Medication Sig Dispense Refill  . albuterol (PROVENTIL HFA;VENTOLIN HFA) 108 (90 BASE) MCG/ACT inhaler Inhale 2 puffs into the lungs every 6 (six) hours as needed for wheezing (One for school and one for home). 2 Inhaler 3  . cetirizine (ZYRTEC) 10 MG tablet Take 10 mg by mouth daily as needed (for allergies.).     Marland Kitchen chlorpheniramine (CHLOR-TRIMETON) 4 MG tablet Take 4 mg by mouth every 4 (four) hours as needed for allergies.    . famotidine (PEPCID  AC) 10 MG chewable tablet Chew 10 mg by mouth daily as needed for heartburn.    . fluticasone (FLONASE) 50 MCG/ACT nasal spray Place 1 spray into both nostrils 2 (two) times daily as needed for allergies or rhinitis.    Marland Kitchen ibuprofen (ADVIL,MOTRIN) 200 MG tablet Take 400-600 mg by mouth 2 (two) times daily as needed for headache or moderate pain.    . Melatonin 5 MG TABS Take 5 mg by mouth at bedtime as needed (for sleep.).    . Naphazoline-Pheniramine (OPCON-A) 0.027-0.315 % SOLN Place 1 drop into both eyes 3 (three) times daily as needed (for irritated/itchy eyes.).    Marland Kitchen norethindrone (MICRONOR,CAMILA,ERRIN) 0.35 MG tablet Take 1 tablet (0.35 mg total) by mouth daily. (Patient taking differently: Take 1 tablet by mouth at bedtime. ) 3 Package 3  . spironolactone (ALDACTONE) 25 MG tablet Take 25 mg by mouth at bedtime.      No current facility-administered medications for this visit.      ALLERGIES: Pollen extract  Family History  Problem Relation Age of Onset  . Hypertension Mother   . Hyperlipidemia Mother   . Scoliosis Mother   . Lupus Maternal Grandmother   . Osteoporosis Maternal Grandmother   . Heart disease Maternal Grandfather   .  Hypertension Maternal Grandfather   . Diabetes Maternal Grandfather   . Emphysema Maternal Grandfather   . Heart attack Maternal Grandfather        occurred < 72 years old  . Hyperlipidemia Maternal Grandfather   . COPD Maternal Grandfather   . Cancer Maternal Grandfather        skin cancer  . Osteoarthritis Paternal Grandmother   . Diabetes Paternal Grandfather   . Heart disease Paternal Grandfather   . Kidney failure Paternal Grandfather        2 transplants due to DM  . Hypertension Paternal Grandfather   . Hyperlipidemia Paternal Grandfather   . Stroke Paternal Grandfather   . Parkinson's disease Paternal Grandfather     Social History   Socioeconomic History  . Marital status: Single    Spouse name: Not on file  . Number of  children: Not on file  . Years of education: Not on file  . Highest education level: Not on file  Social Needs  . Financial resource strain: Not on file  . Food insecurity - worry: Not on file  . Food insecurity - inability: Not on file  . Transportation needs - medical: Not on file  . Transportation needs - non-medical: Not on file  Occupational History  . Not on file  Tobacco Use  . Smoking status: Never Smoker  . Smokeless tobacco: Never Used  Substance and Sexual Activity  . Alcohol use: No  . Drug use: No  . Sexual activity: Yes    Birth control/protection: Condom, Pill    Comment: female/female partner  Other Topics Concern  . Not on file  Social History Narrative   She lives at home with her parents and older sister. Family moved from [place] in Jan. 2014. She will be going into 10th grade in the fall. Her current hopes after high school are to become a nun. She denies use of tobacco, ethanol, or illicit substances. She denies every having sexual intercourse.    Review of Systems  Constitutional: Negative.   HENT: Negative.   Eyes: Negative.   Respiratory: Negative.   Cardiovascular: Negative.   Gastrointestinal: Negative.   Genitourinary:       Vulvar itching   Musculoskeletal: Negative.   Skin: Negative.   Neurological: Negative.   Endo/Heme/Allergies: Negative.   Psychiatric/Behavioral: Negative.     PHYSICAL EXAMINATION:    BP 102/60 (BP Location: Right Arm, Patient Position: Sitting, Cuff Size: Normal)   Pulse 84   Resp 14   Wt 127 lb (57.6 kg)   LMP 05/21/2017 (Exact Date)   BMI 21.13 kg/m     General appearance: alert, cooperative and appears stated age  Pelvic: External genitalia:  no lesions, she has healed beautifully from her surgery, unable to see the suture lines. Not tender to palpation.               Urethra:  normal appearing urethra with no masses, tenderness or lesions, the urethra is just up inside the opening of her vagina               Bartholins and Skenes: normal                 Vagina: normal appearing vagina with normal color and discharge, no lesions               Reviewed the patient's anatomy with her with the help of a mirror   ASSESSMENT Post op check, doing great  PLAN Reviewed her anatomy She will call if she has any issues using a tampon  Discussed being sexually active, she just started OCP's, knows to use condoms She will call if she is having any problems   An After Visit Summary was printed and given to the patient.

## 2017-06-15 ENCOUNTER — Other Ambulatory Visit: Payer: Self-pay

## 2017-06-15 ENCOUNTER — Inpatient Hospital Stay (HOSPITAL_COMMUNITY)
Admission: AD | Admit: 2017-06-15 | Discharge: 2017-06-15 | Disposition: A | Discharge status: Home / Self Care | Payer: Commercial Managed Care - PPO | Source: Ambulatory Visit | Attending: Obstetrics and Gynecology | Admitting: Obstetrics and Gynecology

## 2017-06-15 ENCOUNTER — Encounter (HOSPITAL_COMMUNITY): Payer: Self-pay | Admitting: *Deleted

## 2017-06-15 DIAGNOSIS — K219 Gastro-esophageal reflux disease without esophagitis: Secondary | ICD-10-CM | POA: Diagnosis not present

## 2017-06-15 DIAGNOSIS — R102 Pelvic and perineal pain: Secondary | ICD-10-CM | POA: Diagnosis not present

## 2017-06-15 DIAGNOSIS — N939 Abnormal uterine and vaginal bleeding, unspecified: Secondary | ICD-10-CM | POA: Insufficient documentation

## 2017-06-15 DIAGNOSIS — Z8249 Family history of ischemic heart disease and other diseases of the circulatory system: Secondary | ICD-10-CM | POA: Insufficient documentation

## 2017-06-15 DIAGNOSIS — Z9889 Other specified postprocedural states: Secondary | ICD-10-CM | POA: Insufficient documentation

## 2017-06-15 DIAGNOSIS — Z8269 Family history of other diseases of the musculoskeletal system and connective tissue: Secondary | ICD-10-CM | POA: Diagnosis not present

## 2017-06-15 DIAGNOSIS — S3993XA Unspecified injury of pelvis, initial encounter: Secondary | ICD-10-CM

## 2017-06-15 MED ORDER — IBUPROFEN 200 MG PO TABS: ORAL_TABLET | ORAL | 0 refills | Status: DC

## 2017-06-15 NOTE — MAU Note (Signed)
Dr Pennie RushingHaygood in to see pt. Pelvic exam done and then Dr Pennie RushingHaygood discussing plan of care and d/c home with pt

## 2017-06-15 NOTE — Discharge Instructions (Signed)
Cool saline compresses to perineum at least 4 times daily. No vaginal entry till next appt with Dr. Oscar LaJertson Ibuprofen around the clock for 2 days as directed

## 2017-06-15 NOTE — MAU Provider Note (Signed)
History     CSN: [id number]  Arrival date and time: 06/15/17 0124   First Provider Initiated Contact with Patient 06/15/17 0150      Chief Complaint  Patient presents with  . Vaginal Pain   HPI  Ms. Judy [name] is a 22 y.o. G0P0000 who presents to MAU today with complaint of vaginal pain after digital penetration tonight. She states that she had vaginoplasty on 05/28/17. She feels that after penetration tonight she noted scant blood and possible tissue at the introitus. She states pain now is 2/10 with rest and worst with ambulation and sitting. She has not taken anything for pain.   OB History    Gravida Para Term Preterm AB Living   0 0 0 0 0 0   SAB TAB Ectopic Multiple Live Births   0 0 0 0 0      Past Medical History:  Diagnosis Date  . Acne    tx with spironolactone  . Allergy    Has allergies to dogs and cats.  . Anxiety    no meds  . Asthma    as a child, rarely uses inhaler  . Cat allergies 04/20/2013  . Depression    no meds  . GERD (gastroesophageal reflux disease)    occasional  . Migraine headache with aura    last one 04/2017, triggers lack of sleep-otc prn    Past Surgical History:  Procedure Laterality Date  . LESION REMOVAL N/A 05/28/2017   Procedure: PLASTICS REPAIR OF INTROIDUS;  Surgeon: Romualdo Bolk, MD;  Location: [facility];  Service: Gynecology;  Laterality: N/A;  . WISDOM TOOTH EXTRACTION      Family History  Problem Relation Age of Onset  . Hypertension Mother   . Hyperlipidemia Mother   . Scoliosis Mother   . Lupus Maternal Grandmother   . Osteoporosis Maternal Grandmother   . Heart disease Maternal Grandfather   . Hypertension Maternal Grandfather   . Diabetes Maternal Grandfather   . Emphysema Maternal Grandfather   . Heart attack Maternal Grandfather        occurred < 47 years old  . Hyperlipidemia Maternal Grandfather   . COPD Maternal Grandfather   . Cancer Maternal Grandfather        skin cancer  . Osteoarthritis  Paternal Grandmother   . Diabetes Paternal Grandfather   . Heart disease Paternal Grandfather   . Kidney failure Paternal Grandfather        2 transplants due to DM  . Hypertension Paternal Grandfather   . Hyperlipidemia Paternal Grandfather   . Stroke Paternal Grandfather   . Parkinson's disease Paternal Grandfather     Social History   Tobacco Use  . Smoking status: Never Smoker  . Smokeless tobacco: Never Used  Substance Use Topics  . Alcohol use: No  . Drug use: No    Allergies:  Allergies  Allergen Reactions  . Pollen Extract Other (See Comments)    Sneezing/itchy eyes/runny nose    Medications Prior to Admission  Medication Sig Dispense Refill Last Dose  . albuterol (PROVENTIL HFA;VENTOLIN HFA) 108 (90 BASE) MCG/ACT inhaler Inhale 2 puffs into the lungs every 6 (six) hours as needed for wheezing (One for school and one for home). 2 Inhaler 3 Past Month at Unknown time  . cetirizine (ZYRTEC) 10 MG tablet Take 10 mg by mouth daily as needed (for allergies.).    06/14/2017 at Unknown time  . chlorpheniramine (CHLOR-TRIMETON) 4 MG tablet Take 4 mg by  mouth every 4 (four) hours as needed for allergies.   Past Month at Unknown time  . fluticasone (FLONASE) 50 MCG/ACT nasal spray Place 1 spray into both nostrils 2 (two) times daily as needed for allergies or rhinitis.   Past Month at Unknown time  . ibuprofen (ADVIL,MOTRIN) 200 MG tablet Take 400-600 mg by mouth 2 (two) times daily as needed for headache or moderate pain.   Past Week at Unknown time  . Melatonin 5 MG TABS Take 5 mg by mouth at bedtime as needed (for sleep.).   Past Month at Unknown time  . Naphazoline-Pheniramine (OPCON-A) 0.027-0.315 % SOLN Place 1 drop into both eyes 3 (three) times daily as needed (for irritated/itchy eyes.).   06/14/2017 at Unknown time  . norethindrone (MICRONOR,CAMILA,ERRIN) 0.35 MG tablet Take 1 tablet (0.35 mg total) by mouth daily. 3 Package 3 06/14/2017 at Unknown time  . spironolactone  (ALDACTONE) 25 MG tablet Take 25 mg by mouth at bedtime.    Past Month at Unknown time  . famotidine (PEPCID AC) 10 MG chewable tablet Chew 10 mg by mouth daily as needed for heartburn.   More than a month at Unknown time    Review of Systems  Constitutional: Negative for fever.  Genitourinary: Positive for vaginal bleeding and vaginal pain.   Physical Exam   Blood pressure 131/70, pulse 80, temperature 98.3 F (36.8 C), resp. rate 16, height 5\' 5"  (1.651 m), weight 126 lb (57.2 kg), last menstrual period 05/21/2017.  Physical Exam  Nursing note and vitals reviewed. Constitutional: She is oriented to person, place, and time. She appears well-developed and well-nourished. No distress.  HENT:  Head: Normocephalic and atraumatic.  Cardiovascular: Normal rate.  Respiratory: Effort normal.  GI: Soft. She exhibits no distension and no mass. There is no tenderness. There is no rebound and no guarding.  Genitourinary:    No bleeding in the vagina. No vaginal discharge found.  Genitourinary Comments: The posterior fourchette site of vaginoplasty is healing with a single residual suture visible. There is a disruption of the hymenal ring at about 5 o'clock with minimal bleeding.  The torn tissue is edematous with some ecchymosis, but no necrosis.  There is tenderness to touch.  The introitus is patent   Neurological: She is alert and oriented to person, place, and time.  Skin: Skin is warm and dry. No erythema.  Psychiatric: She has a normal mood and affect.    MAU Course  Procedures None  MDM Discussed patient with Dr. Pennie RushingHaygood. She will come to MAU and evaluate the patient.   Assessment and Plan  A: S/P vaginoplasty Hymenal trauma with initial vaginal entry s/p vaginoplasty Vaginal pain   P: Dr. Pennie RushingHaygood to MAU to evaluate the patient Pt to avoid vaginal entry until next exam with Dr. Oscar LaJertson Saline ice packs to perineum at least 4 times daily OTC Ibuprofen 600 mg around the  clock for the next 2 days, then as needed for pain.   Vonzella NippleJulie Wenzel, PA-C 06/15/2017, 2:39 AM

## 2017-06-15 NOTE — MAU Note (Signed)
Had surgery 2wks ago. Had one small incision made to make vaginal opening larger.Now have tissue hanging out of vagina that is purple in color. Some burning pain at area. Did have rectal prolapse as a child

## 2017-06-17 ENCOUNTER — Telehealth: Payer: Self-pay | Admitting: Obstetrics and Gynecology

## 2017-06-17 NOTE — Telephone Encounter (Signed)
Spoke with patient. Patient was seen in the ED on 06/15/2017 for vaginal pain after digital penetration. Had vaginoplasty on 05/28/17.  Noted scant blood after penetration. Pain was 2/10 with rest and worst with ambulation and sitting. Was advised to avoid vaginal entry until next exam with Dr. Oscar LaJertson, use saline ice packs to perineum at least 4 times daily, take OTC Ibuprofen 600 mg around the clock for the next 2 days, then as needed for pain. States that she is still having some light spotting and is tender but denies any pain or heavy bleeding. Follow up appointment scheduled for 06/19/2017 at 10 am with Dr.Jertson. Advised if symptoms worsen or develops new symptoms will need to be seen earlier for evaluation. Patient is agreeable. Patient is agreeable to date and time.  Routing to provider for final review. Patient agreeable to disposition. Will close encounter.

## 2017-06-17 NOTE — Telephone Encounter (Signed)
Patient was seen at the ED department and need a follow up appointment with Dr Oscar LaJertson.

## 2017-06-18 ENCOUNTER — Ambulatory Visit (INDEPENDENT_AMBULATORY_CARE_PROVIDER_SITE_OTHER): Payer: Commercial Managed Care - PPO | Admitting: Obstetrics and Gynecology

## 2017-06-18 ENCOUNTER — Encounter: Payer: Self-pay | Admitting: Obstetrics and Gynecology

## 2017-06-18 ENCOUNTER — Other Ambulatory Visit: Payer: Self-pay

## 2017-06-18 VITALS — BP 110/68 | HR 84 | Resp 12 | Wt 129.0 lb

## 2017-06-18 DIAGNOSIS — N898 Other specified noninflammatory disorders of vagina: Secondary | ICD-10-CM | POA: Diagnosis not present

## 2017-06-18 DIAGNOSIS — Z3009 Encounter for other general counseling and advice on contraception: Secondary | ICD-10-CM

## 2017-06-18 NOTE — Progress Notes (Signed)
GYNECOLOGY  VISIT   HPI: 22 y.o.   Single  Caucasian  female   G0P0000 with Patient's last menstrual period was 05/21/2017 (exact date).   here for follow up from ED. Patient was seen 06-14-17 for pain and bleeding after digital penetration.  She was noted to have a hymenal tear. Her partner used digital penetration on Friday, he was able to initially insert 2 fingers without pain. Then it started to hurt and she bled. She went to the ER. 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 She is feeling better now, not hurting, no more bleeding. She started the micronor a week ago, questions about her cycle  GYNECOLOGIC HISTORY: Patient's last menstrual period was 05/21/2017 (exact date). Contraception:pill (micronor) Menopausal hormone therapy: none         OB History    Gravida Para Term Preterm AB Living   0 0 0 0 0 0   SAB TAB Ectopic Multiple Live Births   0 0 0 0 0         Patient Active Problem List   Diagnosis Date Noted  . 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 Costochondritis 02/04/2014  . Unspecified asthma(493.90) 04/20/2013  . Cat allergies 04/20/2013  . Seasonal allergies 04/20/2013    Past Medical History:  Diagnosis Date  . Acne    tx with spironolactone  . Allergy    Has allergies to dogs and cats.  . Anxiety    no meds  . Asthma    as a child, rarely uses inhaler  . Cat allergies 04/20/2013  . Depression    no meds  . GERD (gastroesophageal reflux disease)    occasional  . Migraine headache with aura    last one 04/2017, triggers lack of sleep-otc prn    Past Surgical History:  Procedure Laterality Date  . LESION REMOVAL N/A 05/28/2017   Procedure: PLASTICS REPAIR OF INTROIDUS;  Surgeon: Romualdo Bolk, MD;  Location: [facility];  Service: Gynecology;  Laterality: N/A;  . WISDOM TOOTH EXTRACTION      Current Outpatient Medications  Medication Sig Dispense Refill  . albuterol (PROVENTIL HFA;VENTOLIN HFA) 108 (90 BASE)  MCG/ACT inhaler Inhale 2 puffs into the lungs every 6 (six) hours as needed for wheezing (One for school and one for home). 2 Inhaler 3  . cetirizine (ZYRTEC) 10 MG tablet Take 10 mg by mouth daily as needed (for allergies.).     Marland Kitchen chlorpheniramine (CHLOR-TRIMETON) 4 MG tablet Take 4 mg by mouth every 4 (four) hours as needed for allergies.    . famotidine (PEPCID AC) 10 MG chewable tablet Chew 10 mg by mouth daily as needed for heartburn.    . fluticasone (FLONASE) 50 MCG/ACT nasal spray Place 1 spray into both nostrils 2 (two) times daily as needed for allergies or rhinitis.    Marland Kitchen ibuprofen (ADVIL,MOTRIN) 200 MG tablet 3 tablets orally every 6 hours over the next 48 hours around the clock, then 3 tablets every 6 hours as needed for pain 30 tablet 0  . Melatonin 5 MG TABS Take 5 mg by mouth at bedtime as needed (for sleep.).    . Naphazoline-Pheniramine (OPCON-A) 0.027-0.315 % SOLN Place 1 drop into both eyes 3 (three) times daily as needed (for irritated/itchy eyes.).    Marland Kitchen norethindrone (MICRONOR,CAMILA,ERRIN) 0.35 MG tablet Take 1 tablet (0.35 mg total) by mouth daily. 3 Package 3   No current facility-administered medications for this visit.  ALLERGIES: Pollen extract  Family History  Problem Relation Age of Onset  . Hypertension Mother   . Hyperlipidemia Mother   . Scoliosis Mother   . Lupus Maternal Grandmother   . Osteoporosis Maternal Grandmother   . Heart disease Maternal Grandfather   . Hypertension Maternal Grandfather   . Diabetes Maternal Grandfather   . Emphysema Maternal Grandfather   . Heart attack Maternal Grandfather        occurred < 81 years old  . Hyperlipidemia Maternal Grandfather   . COPD Maternal Grandfather   . Cancer Maternal Grandfather        skin cancer  . Osteoarthritis Paternal Grandmother   . Diabetes Paternal Grandfather   . Heart disease Paternal Grandfather   . Kidney failure Paternal Grandfather        2 transplants due to DM  .  Hypertension Paternal Grandfather   . Hyperlipidemia Paternal Grandfather   . Stroke Paternal Grandfather   . Parkinson's disease Paternal Grandfather     Social History   Socioeconomic History  . Marital status: Single    Spouse name: Not on file  . Number of children: Not on file  . Years of education: Not on file  . Highest education level: Not on file  Social Needs  . Financial resource strain: Not on file  . Food insecurity - worry: Not on file  . Food insecurity - inability: Not on file  . Transportation needs - medical: Not on file  . Transportation needs - non-medical: Not on file  Occupational History  . Not on file  Tobacco Use  . Smoking status: Never Smoker  . Smokeless tobacco: Never Used  Substance and Sexual Activity  . Alcohol use: No  . Drug use: No  . Sexual activity: Yes    Birth control/protection: Condom, Pill    Comment: female/female partner  Other Topics Concern  . Not on file  Social History Narrative   She lives at home with her parents and older sister. Family moved from [place] in Jan. 2014. She will be going into 10th grade in the fall. Her current hopes after high school are to become a nun. She denies use of tobacco, ethanol, or illicit substances. She denies every having sexual intercourse.    Review of Systems  Constitutional: Negative.   HENT: Negative.   Eyes: Negative.   Respiratory: Negative.   Cardiovascular: Negative.   Gastrointestinal: Negative.   Genitourinary:       Pain and bleeding after digital penetration   Musculoskeletal: Negative.   Skin: Negative.   Neurological: Negative.   Endo/Heme/Allergies: Negative.   Psychiatric/Behavioral: Negative.     PHYSICAL EXAMINATION:    BP 110/68 (BP Location: Right Arm, Patient Position: Sitting, Cuff Size: Normal)   Pulse 84   Resp 12   Wt 129 lb (58.5 kg)   LMP 05/21/2017 (Exact Date)   BMI 21.47 kg/m     General appearance: alert, cooperative and appears stated  age  Pelvic: External genitalia:  no lesions, repair has healed well, the subcutaneous stitch is visible at 6 o'clock and at the top of the repair on the right. The hymen is visibly torn at 5-6 o'clock              Urethra:  normal appearing urethra with no masses, tenderness or lesions              Bartholins and Skenes: normal  Vagina: normal appearing vagina with normal color and discharge, no lesions. Able to insert my pinky into her vagina without pain.                 Chaperone was present for exam.  ASSESSMENT Hymenal tear with penetration Well healed from her plastics repair of the introitus Counseled about micronor and possible irregular bleeding    PLAN Patient reassured Don't attempt penetration until she no longer has any discomfort Use a lubricant with penetration, she should control rate and depth of penetration She plans to use condoms when sexually active.     An After Visit Summary was printed and given to the patient.

## 2017-06-19 ENCOUNTER — Ambulatory Visit: Payer: Commercial Managed Care - PPO | Admitting: Obstetrics and Gynecology

## 2017-06-28 ENCOUNTER — Encounter: Payer: Self-pay | Admitting: Obstetrics & Gynecology

## 2017-06-28 ENCOUNTER — Telehealth: Payer: Self-pay

## 2017-06-28 ENCOUNTER — Ambulatory Visit (INDEPENDENT_AMBULATORY_CARE_PROVIDER_SITE_OTHER): Payer: Commercial Managed Care - PPO | Admitting: Obstetrics & Gynecology

## 2017-06-28 ENCOUNTER — Telehealth: Payer: Self-pay | Admitting: Obstetrics and Gynecology

## 2017-06-28 VITALS — BP 100/70 | HR 68 | Resp 14 | Ht 65.0 in | Wt 127.0 lb

## 2017-06-28 DIAGNOSIS — R102 Pelvic and perineal pain: Secondary | ICD-10-CM

## 2017-06-28 NOTE — Progress Notes (Signed)
GYNECOLOGY  VISIT  CC:   Stiches removal  HPI: 22 y.o. G0P0000 Single Caucasian female here for suture removal.  Can feel knot that is bothering her and it is very itchy.  Has pain where the knot is as well.  Was planning removal next week but just can't wait.  No drainage or bleeding.  No fevers.  No trouble voiding.  GYNECOLOGIC HISTORY: No LMP recorded. Contraception: OCPs  Patient Active Problem List   Diagnosis Date Noted  . 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 Costochondritis 02/04/2014  . Unspecified asthma(493.90) 04/20/2013  . Cat allergies 04/20/2013  . Seasonal allergies 04/20/2013    Past Medical History:  Diagnosis Date  . Acne    tx with spironolactone  . Allergy    Has allergies to dogs and cats.  . Anxiety    no meds  . Asthma    as a child, rarely uses inhaler  . Cat allergies 04/20/2013  . Depression    no meds  . GERD (gastroesophageal reflux disease)    occasional  . Migraine headache with aura    last one 04/2017, triggers lack of sleep-otc prn    Past Surgical History:  Procedure Laterality Date  . LESION REMOVAL N/A 05/28/2017   Procedure: PLASTICS REPAIR OF INTROIDUS;  Surgeon: Romualdo Bolk, MD;  Location: [facility];  Service: Gynecology;  Laterality: N/A;  . WISDOM TOOTH EXTRACTION      MEDS:   Current Outpatient Medications on File Prior to Visit  Medication Sig Dispense Refill  . albuterol (PROVENTIL HFA;VENTOLIN HFA) 108 (90 BASE) MCG/ACT inhaler Inhale 2 puffs into the lungs every 6 (six) hours as needed for wheezing (One for school and one for home). 2 Inhaler 3  . cetirizine (ZYRTEC) 10 MG tablet Take 10 mg by mouth daily as needed (for allergies.).     Marland Kitchen chlorpheniramine (CHLOR-TRIMETON) 4 MG tablet Take 4 mg by mouth every 4 (four) hours as needed for allergies.    . famotidine (PEPCID AC) 10 MG chewable tablet Chew 10 mg by mouth daily as needed for heartburn.    . fluticasone (FLONASE) 50 MCG/ACT nasal spray Place 1 spray  into both nostrils 2 (two) times daily as needed for allergies or rhinitis.    Marland Kitchen ibuprofen (ADVIL,MOTRIN) 200 MG tablet 3 tablets orally every 6 hours over the next 48 hours around the clock, then 3 tablets every 6 hours as needed for pain 30 tablet 0  . Melatonin 5 MG TABS Take 5 mg by mouth at bedtime as needed (for sleep.).    . Naphazoline-Pheniramine (OPCON-A) 0.027-0.315 % SOLN Place 1 drop into both eyes 3 (three) times daily as needed (for irritated/itchy eyes.).    Marland Kitchen norethindrone (MICRONOR,CAMILA,ERRIN) 0.35 MG tablet Take 1 tablet (0.35 mg total) by mouth daily. 3 Package 3   No current facility-administered medications on file prior to visit.     ALLERGIES: Pollen extract  Family History  Problem Relation Age of Onset  . Hypertension Mother   . Hyperlipidemia Mother   . Scoliosis Mother   . Lupus Maternal Grandmother   . Osteoporosis Maternal Grandmother   . Heart disease Maternal Grandfather   . Hypertension Maternal Grandfather   . Diabetes Maternal Grandfather   . Emphysema Maternal Grandfather   . Heart attack Maternal Grandfather        occurred < 51 years old  . Hyperlipidemia Maternal Grandfather   . COPD Maternal Grandfather   . Cancer Maternal Grandfather  skin cancer  . Osteoarthritis Paternal Grandmother   . Diabetes Paternal Grandfather   . Heart disease Paternal Grandfather   . Kidney failure Paternal Grandfather        2 transplants due to DM  . Hypertension Paternal Grandfather   . Hyperlipidemia Paternal Grandfather   . Stroke Paternal Grandfather   . Parkinson's disease Paternal Grandfather     SH:  Single, non smoker  Review of Systems  All other systems reviewed and are negative.   PHYSICAL EXAMINATION:    Ht 5\' 5"  (1.651 m)   BMI 21.47 kg/m     Physical Exam  Constitutional: She is oriented to person, place, and time. She appears well-developed and well-nourished.  Genitourinary: Vagina normal.    There is no rash,  tenderness, lesion or injury on the right labia. There is no rash, tenderness, lesion or injury on the left labia.  Neurological: She is alert and oriented to person, place, and time.  Skin: Skin is warm and dry.  Psychiatric: She has a normal mood and affect.   Chaperone was present for exam.  Assessment: Vulvar pain at suture site S/p suture removal after excision of hymenal remnant  Plan: Care instructions provided.  Pt will follow up as needed.

## 2017-06-28 NOTE — Telephone Encounter (Signed)
Spoke with patient. 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 Still has stiches in place and they are start to feel uncomfortable. Would like to be seen to have these removed. States she previously discussed this with Dr.Jertson. Appointment scheduled for 07/02/2017 at 3:45 pm with Dr.Jertson. Declines all other offered appointments due to school.  Routing to provider for final review. Patient agreeable to disposition. Will close encounter.

## 2017-06-28 NOTE — Progress Notes (Deleted)
Post Operative Visit  Procedure: Plastic repair of Introidus Days Post-op: 31  Subjective: ***  Objective: BP 100/70 (BP Location: Right Arm, Patient Position: Sitting, Cuff Size: Normal)   Pulse 68   Resp 14   Ht 5\' 5"  (1.651 m)   Wt 127 lb (57.6 kg)   LMP 06/20/2017   BMI 21.13 kg/m   EXAM General: {Exam; general:16600} Resp: {Exam; lung:16931} Cardio: {Exam; heart:5510} GI: {Exam, ZO:1096045}GI:3041136} Extremities: {Exam; extremity:5109} Vaginal Bleeding: {exam; vaginal bleeding:3041122}  Assessment: s/p ***  Plan: Recheck {NUMBER 1-10:22536} weeks ***

## 2017-06-28 NOTE — Telephone Encounter (Signed)
Patient has questions about the surgery she had a few weeks ago.

## 2017-06-28 NOTE — Telephone Encounter (Signed)
Spoke with patient. Patient states that she felt she could wait until next week to have Dr.Jertson remove her stiches from her repair of introidus on 05/28/2017 but is becoming increasing uncomfortable and is having pain where stitches are placed. Would like to be seen today by another MD. Appointment scheduled for today at 12:45 pm with Dr.Miller (time per Kennon RoundsSally). Patient is agreeable to date and time.  Cc: Dr.Jertson  Routing to covering provider for final review. Patient agreeable to disposition. Will close encounter.

## 2017-07-02 ENCOUNTER — Ambulatory Visit: Payer: Commercial Managed Care - PPO | Admitting: Obstetrics and Gynecology

## 2017-07-29 ENCOUNTER — Telehealth: Payer: Self-pay | Admitting: Obstetrics and Gynecology

## 2017-07-29 NOTE — Telephone Encounter (Signed)
I recommend office visit for evaluation.  She can try to dilute urine by using water on her vulvar if it is really painful with voiding.  (Peri-bottle)  Cc- Dr. Oscar LaJertson

## 2017-07-29 NOTE — Telephone Encounter (Signed)
Spoke with patient. Patient is s/p plastic repair of introidus on 05/31/17.    Patient states she feels like she experiences a tear in the same location where stiches were each time she has intercourse. Reports pain during and after immediately intercourse along with spotting and burning when urine touches skin.   Reports using lubricant. No bleeding or pain currently.   Recommended OV for further evaluation, OV scheduled for 08/01/17 at 10am. Patient declined OV for 2/19 and 2/20. Recommended nothing in vagina until further evaluation completed. Advised to return call should bleeding become heavy or severe pain develops. Advised will review with covering provider and return call with any additional recommendations. patient is agreeable.    Dr. Edward JollySilva -any additional recommendations?    Cc: Dr. Oscar LaJertson

## 2017-07-29 NOTE — Telephone Encounter (Signed)
Patient called requesting to speak with the nurse with concerns about her vagina after having sex. She had surgery in December 2018.

## 2017-07-30 NOTE — Telephone Encounter (Signed)
Patient returning call.

## 2017-07-30 NOTE — Telephone Encounter (Signed)
Call returned to patient, no answer, unable to leave message, mailbox full.

## 2017-07-30 NOTE — Telephone Encounter (Signed)
Spoke with patient, advised as seen below per Dr. Silva. Patient verbalizes understanding and is agreeable. Will close encounter.  

## 2017-08-01 ENCOUNTER — Ambulatory Visit (INDEPENDENT_AMBULATORY_CARE_PROVIDER_SITE_OTHER): Payer: Commercial Managed Care - PPO | Admitting: Obstetrics and Gynecology

## 2017-08-01 ENCOUNTER — Other Ambulatory Visit: Payer: Self-pay

## 2017-08-01 ENCOUNTER — Encounter: Payer: Self-pay | Admitting: Obstetrics and Gynecology

## 2017-08-01 VITALS — BP 100/60 | HR 64 | Resp 14 | Wt 127.0 lb

## 2017-08-01 DIAGNOSIS — S3141XA Laceration without foreign body of vagina and vulva, initial encounter: Secondary | ICD-10-CM

## 2017-08-01 DIAGNOSIS — F5231 Female orgasmic disorder: Secondary | ICD-10-CM

## 2017-08-01 DIAGNOSIS — IMO0002 Reserved for concepts with insufficient information to code with codable children: Secondary | ICD-10-CM

## 2017-08-01 DIAGNOSIS — IMO0001 Reserved for inherently not codable concepts without codable children: Secondary | ICD-10-CM

## 2017-08-01 MED ORDER — LIDOCAINE 5 % EX OINT: TOPICAL_OINTMENT | CUTANEOUS | 0 refills | Status: DC

## 2017-08-01 NOTE — Progress Notes (Signed)
GYNECOLOGY  VISIT   HPI: 22 y.o.   Single  Caucasian  female   G0P0000 with Patient's last menstrual period was 07/15/2017.   Here c/o vaginal tear after intercourse. 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 She started being sexually active for the first time 3 weeks ago. Each time she has slight pain on entry, during sex she feels some discomfort at the opening, positional. He has been able to fully penetrate. Feels sore afterward sex at the introitus, moderately uncomfortable. Feels like she is tearing posteriorly in the midline where her incision was. Heals after a few days. Last sex was a week ago, feels better now.  She has been able to use a small tampon. She also c/o a small lump on her right labia majora, noticed it a few days ago, getting smaller, not tender. She is having trouble having orgasms.   GYNECOLOGIC HISTORY: Patient's last menstrual period was 07/15/2017. Contraception:OCP Menopausal hormone therapy: none         OB History    Gravida Para Term Preterm AB Living   0 0 0 0 0 0   SAB TAB Ectopic Multiple Live Births   0 0 0 0 0         Patient Active Problem List   Diagnosis Date Noted  . 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 Costochondritis 02/04/2014  . Unspecified asthma(493.90) 04/20/2013  . Cat allergies 04/20/2013  . Seasonal allergies 04/20/2013    Past Medical History:  Diagnosis Date  . Acne    tx with spironolactone  . Allergy    Has allergies to dogs and cats.  . Anxiety    no meds  . Asthma    as a child, rarely uses inhaler  . Cat allergies 04/20/2013  . Depression    no meds  . GERD (gastroesophageal reflux disease)    occasional  . Migraine headache with aura    last one 04/2017, triggers lack of sleep-otc prn    Past Surgical History:  Procedure Laterality Date  . LESION REMOVAL N/A 05/28/2017   Procedure: PLASTICS REPAIR OF INTROIDUS;  Surgeon: Romualdo Bolk, MD;  Location: [facility];   Service: Gynecology;  Laterality: N/A;  . WISDOM TOOTH EXTRACTION      Current Outpatient Medications  Medication Sig Dispense Refill  . albuterol (PROVENTIL HFA;VENTOLIN HFA) 108 (90 BASE) MCG/ACT inhaler Inhale 2 puffs into the lungs every 6 (six) hours as needed for wheezing (One for school and one for home). 2 Inhaler 3  . cetirizine (ZYRTEC) 10 MG tablet Take 10 mg by mouth daily as needed (for allergies.).     Marland Kitchen famotidine (PEPCID AC) 10 MG chewable tablet Chew 10 mg by mouth daily as needed for heartburn.    . fluticasone (FLONASE) 50 MCG/ACT nasal spray Place 1 spray into both nostrils 2 (two) times daily as needed for allergies or rhinitis.    Marland Kitchen ibuprofen (ADVIL,MOTRIN) 200 MG tablet 3 tablets orally every 6 hours over the next 48 hours around the clock, then 3 tablets every 6 hours as needed for pain 30 tablet 0  . Melatonin 5 MG TABS Take 5 mg by mouth at bedtime as needed (for sleep.).    . Naphazoline-Pheniramine (OPCON-A) 0.027-0.315 % SOLN Place 1 drop into both eyes 3 (three) times daily as needed (for irritated/itchy eyes.).    Marland Kitchen norethindrone (MICRONOR,CAMILA,ERRIN) 0.35 MG tablet Take 1 tablet (0.35 mg total) by mouth daily. 3 Package  3   No current facility-administered medications for this visit.      ALLERGIES: Pollen extract  Family History  Problem Relation Age of Onset  . Hypertension Mother   . Hyperlipidemia Mother   . Scoliosis Mother   . Lupus Maternal Grandmother   . Osteoporosis Maternal Grandmother   . Heart disease Maternal Grandfather   . Hypertension Maternal Grandfather   . Diabetes Maternal Grandfather   . Emphysema Maternal Grandfather   . Heart attack Maternal Grandfather        occurred < 20 years old  . Hyperlipidemia Maternal Grandfather   . COPD Maternal Grandfather   . Cancer Maternal Grandfather        skin cancer  . Osteoarthritis Paternal Grandmother   . Diabetes Paternal Grandfather   . Heart disease Paternal Grandfather   .  Kidney failure Paternal Grandfather        2 transplants due to DM  . Hypertension Paternal Grandfather   . Hyperlipidemia Paternal Grandfather   . Stroke Paternal Grandfather   . Parkinson's disease Paternal Grandfather     Social History   Socioeconomic History  . Marital status: Single    Spouse name: Not on file  . Number of children: Not on file  . Years of education: Not on file  . Highest education level: Not on file  Social Needs  . Financial resource strain: Not on file  . Food insecurity - worry: Not on file  . Food insecurity - inability: Not on file  . Transportation needs - medical: Not on file  . Transportation needs - non-medical: Not on file  Occupational History  . Not on file  Tobacco Use  . Smoking status: Never Smoker  . Smokeless tobacco: Never Used  Substance and Sexual Activity  . Alcohol use: No  . Drug use: No  . Sexual activity: Yes    Birth control/protection: Condom, Pill    Comment: female/female partner  Other Topics Concern  . Not on file  Social History Narrative   She lives at home with her parents and older sister. Family moved from [place] in Jan. 2014. She will be going into 10th grade in the fall. Her current hopes after high school are to become a nun. She denies use of tobacco, ethanol, or illicit substances. She denies every having sexual intercourse.    Review of Systems  Constitutional: Negative.   HENT: Negative.   Eyes: Negative.   Respiratory: Negative.   Cardiovascular: Negative.   Gastrointestinal: Negative.   Genitourinary:       Vaginal tear/ pain with intercourse  Lump on labia Unable to have an organism   Musculoskeletal: Negative.   Skin: Negative.   Neurological: Negative.   Endo/Heme/Allergies: Negative.   Psychiatric/Behavioral: Negative.     PHYSICAL EXAMINATION:    BP 100/60 (BP Location: Right Arm, Patient Position: Sitting, Cuff Size: Normal)   Pulse 64   Resp 14   Wt 127 lb (57.6 kg)   LMP  07/15/2017   BMI 21.13 kg/m     General appearance: alert, cooperative and appears stated age  Pelvic: External genitalia:  no lesions. She does have a small healing laceration in the posterior fourchette. The lump she is referring to is 2 mm under the skin and not tender.               Urethra:  normal appearing urethra with no masses, tenderness or lesions  Bartholins and Skenes: normal                 Vagina: normal appearing vagina, she was uncomfortable with even a pediatric speculum. The vagina is very tight with the insertion of one finger vaginally.               Cervix: not seen              Bimanual Exam:  Uterus:  normal size, contour, position, consistency, mobility, non-tender              Adnexa: no mass, fullness, tenderness               Chaperone was present for exam.  ASSESSMENT Vulvar laceration s/p intercourse, it is under the area of the repair The vagina if very tight on exam Difficulty with orgasm    PLAN No intercourse x 2 weeks Use lots of lubrication, she will control rate and depth of penetration Lidocaine ointment given for prn use If she continues to have this problem, recommend vaginal dilators Discussed orgasms, reading information given   An After Visit Summary was printed and given to the patient.  ~20 minutes face to face time of which over 50% was spent in counseling.

## 2017-08-14 ENCOUNTER — Telehealth: Payer: Self-pay | Admitting: Family Medicine

## 2017-08-14 NOTE — Telephone Encounter (Signed)
Called and spoke to pt to remind them of their apt tomorrow. Advised of building number and time restrictions. °

## 2017-08-15 ENCOUNTER — Ambulatory Visit: Payer: Commercial Managed Care - PPO | Admitting: Family Medicine

## 2017-08-15 ENCOUNTER — Other Ambulatory Visit: Payer: Self-pay

## 2017-08-15 ENCOUNTER — Encounter: Payer: Self-pay | Admitting: Family Medicine

## 2017-08-15 VITALS — BP 120/84 | HR 76 | Temp 98.7°F | Ht 65.0 in | Wt 127.8 lb

## 2017-08-15 DIAGNOSIS — L731 Pseudofolliculitis barbae: Secondary | ICD-10-CM

## 2017-08-15 NOTE — Progress Notes (Signed)
3/7/20199:48 AM  Judy [name] 18-Nov-1995, 22 y.o. female [id number]  Chief Complaint  Patient presents with  . Hirsutism    has unwated hair in the breast area, Bump developed 4 days ago. Bump is red and increasing in size.     HPI:   Patient is a 22 y.o. female who presents today for 4 days of ingrown hair. She has been plucking hair around her nipple and 4 days ago she started noticing a small bump. Not painful, minimal redness, no warmth, no drainage. Not resolving so she came in for more definitive mgt. She denies any fever or chills. She denies any swollen glands.  Depression screen [facility] 2/9 08/15/2017 05/31/2017  Decreased Interest 0 0  Down, Depressed, Hopeless 0 0  PHQ - 2 Score 0 0    Allergies  Allergen Reactions  . Pollen Extract Other (See Comments)    Sneezing/itchy eyes/runny nose    Prior to Admission medications   Medication Sig Start Date End Date Taking? Authorizing Provider  albuterol (PROVENTIL HFA;VENTOLIN HFA) 108 (90 BASE) MCG/ACT inhaler Inhale 2 puffs into the lungs every 6 (six) hours as needed for wheezing (One for school and one for home). 03/01/14  Yes Perez-Fiery, Angelique Blonder, MD  cetirizine (ZYRTEC) 10 MG tablet Take 10 mg by mouth daily as needed (for allergies.).    Yes [provider]  famotidine (PEPCID AC) 10 MG chewable tablet Chew 10 mg by mouth daily as needed for heartburn.   Yes [provider]  fluticasone (FLONASE) 50 MCG/ACT nasal spray Place 1 spray into both nostrils 2 (two) times daily as needed for allergies or rhinitis.   Yes [provider]  ibuprofen (ADVIL,MOTRIN) 200 MG tablet 3 tablets orally every 6 hours over the next 48 hours around the clock, then 3 tablets every 6 hours as needed for pain 06/15/17  Yes Haygood, Maris Berger, MD  lidocaine (XYLOCAINE) 5 % ointment Apply a pea sized amount topically 20 minutes prior to intercourse (wipe off just prior to intercourse) and up to 3 x prn pain 08/01/17  Yes Romualdo Bolk, MD  Melatonin 5 MG TABS Take 5 mg by mouth at bedtime as needed (for sleep.).   Yes [provider]  Naphazoline-Pheniramine (OPCON-A) 0.027-0.315 % SOLN Place 1 drop into both eyes 3 (three) times daily as needed (for irritated/itchy eyes.).   Yes [provider]  norethindrone (MICRONOR,CAMILA,ERRIN) 0.35 MG tablet Take 1 tablet (0.35 mg total) by mouth daily. 05/23/17  Yes Romualdo Bolk, MD    Past Medical History:  Diagnosis Date  . Acne    tx with spironolactone  . Allergy    Has allergies to dogs and cats.  . Anxiety    no meds  . Asthma    as a child, rarely uses inhaler  . Cat allergies 04/20/2013  . Depression    no meds  . GERD (gastroesophageal reflux disease)    occasional  . Migraine headache with aura    last one 04/2017, triggers lack of sleep-otc prn    Past Surgical History:  Procedure Laterality Date  . LESION REMOVAL N/A 05/28/2017   Procedure: PLASTICS REPAIR OF INTROIDUS;  Surgeon: Romualdo Bolk, MD;  Location: [facility];  Service: Gynecology;  Laterality: N/A;  . WISDOM TOOTH EXTRACTION      Social History   Tobacco Use  . Smoking status: Never Smoker  . Smokeless tobacco: Never Used  Substance Use Topics  . Alcohol use: No  Family History  Problem Relation Age of Onset  . Hypertension Mother   . Hyperlipidemia Mother   . Scoliosis Mother   . Lupus Maternal Grandmother   . Osteoporosis Maternal Grandmother   . Heart disease Maternal Grandfather   . Hypertension Maternal Grandfather   . Diabetes Maternal Grandfather   . Emphysema Maternal Grandfather   . Heart attack Maternal Grandfather        occurred < 20 years old  . Hyperlipidemia Maternal Grandfather   . COPD Maternal Grandfather   . Cancer Maternal Grandfather        skin cancer  . Osteoarthritis Paternal Grandmother   . Diabetes Paternal Grandfather   . Heart disease Paternal Grandfather   . Kidney failure Paternal Grandfather         2 transplants due to DM  . Hypertension Paternal Grandfather   . Hyperlipidemia Paternal Grandfather   . Stroke Paternal Grandfather   . Parkinson's disease Paternal Grandfather     ROS Per hpi  OBJECTIVE:  Blood pressure 120/84, pulse 76, temperature 98.7 F (37.1 C), temperature source Oral, height 5\' 5"  (1.651 m), weight 127 lb 12.8 oz (58 kg), last menstrual period 08/09/2017, SpO2 98 %.  Physical Exam   Gen: aaox3, nad Skin: left breast, upper edge of nipple a small pustule with a dark visible line within  Verbal consent obtained. Skin was cleansed with alcohol. Using a 25g needle, pustule was de-roofed and hair was removed without any difficulties. Patient tolerated well. Band=aid placed. Routine care and precautions given.    ASSESSMENT and PLAN  1. Ingrown hair Removed wo issues. Discussed with patient options for mgt of unwanted body hair.   Return if symptoms worsen or fail to improve.     Myles LippsIrma M Santiago, MD Primary Care at [facility] [address] [place], [place] [address] Ph.  [phone] Fax [phone]

## 2017-08-15 NOTE — Patient Instructions (Signed)
     IF you received an x-ray today, you will receive an invoice from [facility]. Please contact [facility] at [phone] with questions or concerns regarding your invoice.   IF you received labwork today, you will receive an invoice from [facility]. Please contact [facility] at [phone] with questions or concerns regarding your invoice.   Our billing staff will not be able to assist you with questions regarding bills from these companies.  You will be contacted with the lab results as soon as they are available. The fastest way to get your results is to activate your My Chart account. Instructions are located on the last page of this paperwork. If you have not heard from us regarding the results in 2 weeks, please contact this office.     

## 2017-08-16 ENCOUNTER — Encounter: Payer: Self-pay | Admitting: Family Medicine

## 2017-08-19 ENCOUNTER — Ambulatory Visit: Payer: Commercial Managed Care - PPO | Admitting: Obstetrics and Gynecology

## 2017-08-19 ENCOUNTER — Other Ambulatory Visit: Payer: Self-pay

## 2017-08-19 ENCOUNTER — Encounter: Payer: Self-pay | Admitting: Obstetrics and Gynecology

## 2017-08-19 ENCOUNTER — Telehealth: Payer: Self-pay | Admitting: Obstetrics and Gynecology

## 2017-08-19 VITALS — BP 124/80 | HR 76 | Resp 12 | Wt 126.0 lb

## 2017-08-19 DIAGNOSIS — Z3009 Encounter for other general counseling and advice on contraception: Secondary | ICD-10-CM

## 2017-08-19 DIAGNOSIS — IMO0001 Reserved for inherently not codable concepts without codable children: Secondary | ICD-10-CM

## 2017-08-19 DIAGNOSIS — S3141XA Laceration without foreign body of vagina and vulva, initial encounter: Secondary | ICD-10-CM | POA: Diagnosis not present

## 2017-08-19 NOTE — Telephone Encounter (Signed)
Please add her to the end of my day today

## 2017-08-19 NOTE — Progress Notes (Signed)
GYNECOLOGY  VISIT   HPI: 22 y.o.   Single  Caucasian  female   [id number] with Patient's last menstrual period was 08/11/2017.   here for   Vaginal tear. 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 The perineum was opened. She is having tearing with intercourse. She had sex on Saturday after 2 weeks of not having sex. She used a lubricant, slightly uncomfortable during sex, after tender when washing, going to the bathroom, moving. Slight bleeding after sex, not since then. He was able to fully penetrate. No deep pain.    She has questions about hair growth around her areolar region. She has questions about different forms of contraception.   GYNECOLOGIC HISTORY: Patient's last menstrual period was 08/11/2017. Contraception: OCP  Menopausal hormone therapy: none         OB History    Gravida Para Term Preterm AB Living   0 0 0 0 0 0   SAB TAB Ectopic Multiple Live Births   0 0 0 0 0         Patient Active Problem List   Diagnosis Date Noted  . 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 Costochondritis 02/04/2014  . Unspecified asthma(493.90) 04/20/2013  . Cat allergies 04/20/2013  . Seasonal allergies 04/20/2013    Past Medical History:  Diagnosis Date  . Acne    tx with spironolactone  . Allergy    Has allergies to dogs and cats.  . Anxiety    no meds  . Asthma    as a child, rarely uses inhaler  . Cat allergies 04/20/2013  . Depression    no meds  . GERD (gastroesophageal reflux disease)    occasional  . Migraine headache with aura    last one 04/2017, triggers lack of sleep-otc prn    Past Surgical History:  Procedure Laterality Date  . LESION REMOVAL N/A 05/28/2017   Procedure: PLASTICS REPAIR OF INTROIDUS;  Surgeon: Romualdo Bolk, MD;  Location: [facility];  Service: Gynecology;  Laterality: N/A;  . WISDOM TOOTH EXTRACTION      Current Outpatient Medications  Medication Sig Dispense Refill  . albuterol (PROVENTIL  HFA;VENTOLIN HFA) 108 (90 BASE) MCG/ACT inhaler Inhale 2 puffs into the lungs every 6 (six) hours as needed for wheezing (One for school and one for home). 2 Inhaler 3  . cetirizine (ZYRTEC) 10 MG tablet Take 10 mg by mouth daily as needed (for allergies.).     Marland Kitchen famotidine (PEPCID AC) 10 MG chewable tablet Chew 10 mg by mouth daily as needed for heartburn.    . fluticasone (FLONASE) 50 MCG/ACT nasal spray Place 1 spray into both nostrils 2 (two) times daily as needed for allergies or rhinitis.    Marland Kitchen ibuprofen (ADVIL,MOTRIN) 200 MG tablet 3 tablets orally every 6 hours over the next 48 hours around the clock, then 3 tablets every 6 hours as needed for pain 30 tablet 0  . lidocaine (XYLOCAINE) 5 % ointment Apply a pea sized amount topically 20 minutes prior to intercourse (wipe off just prior to intercourse) and up to 3 x prn pain 30 g 0  . Melatonin 5 MG TABS Take 5 mg by mouth at bedtime as needed (for sleep.).    . Naphazoline-Pheniramine (OPCON-A) 0.027-0.315 % SOLN Place 1 drop into both eyes 3 (three) times daily as needed (for irritated/itchy eyes.).    Marland Kitchen norethindrone (MICRONOR,CAMILA,ERRIN) 0.35 MG tablet Take 1 tablet (0.35 mg total) by mouth  daily. 3 Package 3   No current facility-administered medications for this visit.      ALLERGIES: Pollen extract  Family History  Problem Relation Age of Onset  . Hypertension Mother   . Hyperlipidemia Mother   . Scoliosis Mother   . Lupus Maternal Grandmother   . Osteoporosis Maternal Grandmother   . Heart disease Maternal Grandfather   . Hypertension Maternal Grandfather   . Diabetes Maternal Grandfather   . Emphysema Maternal Grandfather   . Heart attack Maternal Grandfather        occurred < 53 years old  . Hyperlipidemia Maternal Grandfather   . COPD Maternal Grandfather   . Cancer Maternal Grandfather        skin cancer  . Osteoarthritis Paternal Grandmother   . Diabetes Paternal Grandfather   . Heart disease Paternal Grandfather    . Kidney failure Paternal Grandfather        2 transplants due to DM  . Hypertension Paternal Grandfather   . Hyperlipidemia Paternal Grandfather   . Stroke Paternal Grandfather   . Parkinson's disease Paternal Grandfather     Social History   Socioeconomic History  . Marital status: Single    Spouse name: Not on file  . Number of children: Not on file  . Years of education: Not on file  . Highest education level: Not on file  Social Needs  . Financial resource strain: Not on file  . Food insecurity - worry: Not on file  . Food insecurity - inability: Not on file  . Transportation needs - medical: Not on file  . Transportation needs - non-medical: Not on file  Occupational History  . Not on file  Tobacco Use  . Smoking status: Never Smoker  . Smokeless tobacco: Never Used  Substance and Sexual Activity  . Alcohol use: No  . Drug use: No  . Sexual activity: Yes    Birth control/protection: Condom, Pill    Comment: female/female partner  Other Topics Concern  . Not on file  Social History Narrative   She lives at home with her parents and older sister. Family moved from [place] in Jan. 2014. She will be going into 10th grade in the fall. Her current hopes after high school are to become a nun. She denies use of tobacco, ethanol, or illicit substances. She denies every having sexual intercourse.    Review of Systems  Constitutional: Negative.   HENT: Negative.   Eyes: Negative.   Respiratory: Negative.   Cardiovascular: Negative.   Gastrointestinal: Negative.   Genitourinary:       Discharge Pain with intercourse Vaginal tear   Musculoskeletal: Negative.   Skin: Negative.   Neurological: Negative.   Endo/Heme/Allergies: Negative.   Psychiatric/Behavioral: Negative.     PHYSICAL EXAMINATION:    BP 124/80 (BP Location: Right Arm, Patient Position: Sitting, Cuff Size: Normal)   Pulse 76   Resp 12   Wt 126 lb (57.2 kg)   LMP 08/11/2017   BMI 20.97  kg/m     General appearance: alert, cooperative and appears stated age  Pelvic: External genitalia:  no lesions, she does have a small laceration on the perineum in the midline. No signs of infection.               Urethra:  normal appearing urethra with no masses, tenderness or lesions              Bartholins and Skenes: normal  Vagina: speculum exam was deferred. Vaginal exam with one finger, tight              Cervix: no cervical motion tenderness              Bimanual Exam:  Uterus:  normal size, contour, position, consistency, mobility, non-tender              Adnexa: no mass, fullness, tenderness               Chaperone was present for exam.  ASSESSMENT Perineal laceration with intercourse. Tight vagina She c/o random hairs around her areolar region (not examined, states she plucked them). She denies hair on her face, chest wall or lower abdomen. Reassured that this is not uncommon and discussed ways to get rid of them Contraception, currently on OCP's    PLAN Use vaseline or A&D ointment on her perineum as needed Void in water if needed Recommend she use vaginal dilators, discussed the option of ordering them on line or our office ordering them for her Instructions on the use of vaginal dilators was given If she is having any trouble with the vaginal dilators, I can help her here or refer to PT Discussed contraception, particularly the nexplanon, the kyleena IUD and the mirena IUD. I think she would need vaginal dilation prior to placing an IUD. Information was given Information on orgasms also given   An After Visit Summary was printed and given to the patient.  ~25 minutes face to face time of which over 50% was spent in counseling.

## 2017-08-19 NOTE — Telephone Encounter (Signed)
Spoke with patient. Appointment scheduled for today at 4:15 pm with Dr.Jertson. Patient is agreeable to date and time. Encounter closed. 

## 2017-08-19 NOTE — Telephone Encounter (Signed)
Spoke with patient. Patient states that she has a vaginal tear that occurred after intercourse this weekend. States that this has occurred in the past but this is worse and more painful than before. Is also having increased yellow vaginal discharge that started yesterday. Pain with urination due to tear. Requesting appointment today due to discomfort. Advised will review with Dr.Jertson and return call.

## 2017-08-19 NOTE — Telephone Encounter (Signed)
Patient has another vaginal tear and would like tos see Dr.Jertson.

## 2017-08-21 ENCOUNTER — Other Ambulatory Visit: Payer: Self-pay

## 2017-08-21 ENCOUNTER — Encounter: Payer: Self-pay | Admitting: Family Medicine

## 2017-08-21 ENCOUNTER — Ambulatory Visit: Payer: Commercial Managed Care - PPO | Admitting: Family Medicine

## 2017-08-21 VITALS — BP 108/62 | HR 97 | Temp 98.8°F | Ht 65.0 in | Wt 125.0 lb

## 2017-08-21 DIAGNOSIS — J029 Acute pharyngitis, unspecified: Secondary | ICD-10-CM | POA: Diagnosis not present

## 2017-08-21 DIAGNOSIS — L739 Follicular disorder, unspecified: Secondary | ICD-10-CM

## 2017-08-21 NOTE — Patient Instructions (Signed)
     IF you received an x-ray today, you will receive an invoice from [facility]. Please contact  Radiology at [phone] with questions or concerns regarding your invoice.   IF you received labwork today, you will receive an invoice from [facility]. Please contact [facility] at [phone] with questions or concerns regarding your invoice.   Our billing staff will not be able to assist you with questions regarding bills from these companies.  You will be contacted with the lab results as soon as they are available. The fastest way to get your results is to activate your My Chart account. Instructions are located on the last page of this paperwork. If you have not heard from us regarding the results in 2 weeks, please contact this office.     

## 2017-08-21 NOTE — Progress Notes (Signed)
3/13/201911:24 AM  Judy [name] [birth year], 22 y.o. female [id number]  Chief Complaint  Patient presents with  . Follow-up    having ingrown hair on the buttuck area, also having sore throat since yesterday.    HPI:   Patient is a 22 y.o. female who presents today with 2 concerns;  1.sore throat that started yesterday, mild nasal congestion with PND. No fevers or swollen glands. No cough or SOB. No sneezing, itchy eyes, ears or throat. She reports friend recently had a sinus infection  2. Ingrown hair, right buttock, in the past needed oral antibiotics, no fever or chills. No tenderness to area, no swelling, redness or warmth.  Depression screen [facility] 2/9 08/21/2017 08/15/2017 05/31/2017  Decreased Interest 0 0 0  Down, Depressed, Hopeless 0 0 0  PHQ - 2 Score 0 0 0    Allergies  Allergen Reactions  . Pollen Extract Other (See Comments)    Sneezing/itchy eyes/runny nose    Prior to Admission medications   Medication Sig Start Date End Date Taking? Authorizing Provider  albuterol (PROVENTIL HFA;VENTOLIN HFA) 108 (90 BASE) MCG/ACT inhaler Inhale 2 puffs into the lungs every 6 (six) hours as needed for wheezing (One for school and one for home). 03/01/14  Yes Perez-Fiery, Angelique Blonder, MD  cetirizine (ZYRTEC) 10 MG tablet Take 10 mg by mouth daily as needed (for allergies.).    Yes [provider]  famotidine (PEPCID AC) 10 MG chewable tablet Chew 10 mg by mouth daily as needed for heartburn.   Yes [provider]  fluticasone (FLONASE) 50 MCG/ACT nasal spray Place 1 spray into both nostrils 2 (two) times daily as needed for allergies or rhinitis.   Yes [provider]  ibuprofen (ADVIL,MOTRIN) 200 MG tablet 3 tablets orally every 6 hours over the next 48 hours around the clock, then 3 tablets every 6 hours as needed for pain 06/15/17  Yes Haygood, Maris Berger, MD  lidocaine (XYLOCAINE) 5 % ointment Apply a pea sized amount topically 20 minutes prior to intercourse  (wipe off just prior to intercourse) and up to 3 x prn pain 08/01/17  Yes Romualdo Bolk, MD  Melatonin 5 MG TABS Take 5 mg by mouth at bedtime as needed (for sleep.).   Yes [provider]  Naphazoline-Pheniramine (OPCON-A) 0.027-0.315 % SOLN Place 1 drop into both eyes 3 (three) times daily as needed (for irritated/itchy eyes.).   Yes [provider]  norethindrone (MICRONOR,CAMILA,ERRIN) 0.35 MG tablet Take 1 tablet (0.35 mg total) by mouth daily. 05/23/17  Yes Romualdo Bolk, MD    Past Medical History:  Diagnosis Date  . Acne    tx with spironolactone  . Allergy    Has allergies to dogs and cats.  . Anxiety    no meds  . Asthma    as a child, rarely uses inhaler  . Cat allergies 04/20/2013  . Depression    no meds  . GERD (gastroesophageal reflux disease)    occasional  . Migraine headache with aura    last one 04/2017, triggers lack of sleep-otc prn    Past Surgical History:  Procedure Laterality Date  . LESION REMOVAL N/A 05/28/2017   Procedure: PLASTICS REPAIR OF INTROIDUS;  Surgeon: Romualdo Bolk, MD;  Location: [facility];  Service: Gynecology;  Laterality: N/A;  . WISDOM TOOTH EXTRACTION      Social History   Tobacco Use  . Smoking status: Never Smoker  . Smokeless tobacco: Never Used  Substance  Use Topics  . Alcohol use: No    Family History  Problem Relation Age of Onset  . Hypertension Mother   . Hyperlipidemia Mother   . Scoliosis Mother   . Lupus Maternal Grandmother   . Osteoporosis Maternal Grandmother   . Heart disease Maternal Grandfather   . Hypertension Maternal Grandfather   . Diabetes Maternal Grandfather   . Emphysema Maternal Grandfather   . Heart attack Maternal Grandfather        occurred < 20 years old  . Hyperlipidemia Maternal Grandfather   . COPD Maternal Grandfather   . Cancer Maternal Grandfather        skin cancer  . Osteoarthritis Paternal Grandmother   . Diabetes Paternal Grandfather   .  Heart disease Paternal Grandfather   . Kidney failure Paternal Grandfather        2 transplants due to DM  . Hypertension Paternal Grandfather   . Hyperlipidemia Paternal Grandfather   . Stroke Paternal Grandfather   . Parkinson's disease Paternal Grandfather     ROS Per hpi  OBJECTIVE:  Blood pressure 108/62, pulse 97, temperature 98.8 F (37.1 C), temperature source Oral, height 5\' 5"  (1.651 m), weight 125 lb (56.7 kg), last menstrual period 08/11/2017, SpO2 100 %.  Physical Exam  Constitutional: She is oriented to person, place, and time and well-developed, well-nourished, and in no distress.  HENT:  Head: Normocephalic and atraumatic.  Right Ear: Hearing, tympanic membrane, external ear and ear canal normal.  Left Ear: Hearing, tympanic membrane, external ear and ear canal normal.  Mouth/Throat: Posterior oropharyngeal erythema present. No oropharyngeal exudate, posterior oropharyngeal edema or tonsillar abscesses.  Eyes: EOM are normal. Pupils are equal, round, and reactive to light.  Neck: Neck supple.  Cardiovascular: Normal rate, regular rhythm and normal heart sounds. Exam reveals no gallop and no friction rub.  No murmur heard. Pulmonary/Chest: Effort normal and breath sounds normal. She has no wheezes. She has no rales.  Lymphadenopathy:    She has no cervical adenopathy.  Neurological: She is alert and oriented to person, place, and time. Gait normal.  Skin: Skin is warm and dry.  Right buttock with a 2mm area of excoriation. No erythema, drainage, swelling or warmth.     ASSESSMENT and PLAN  1. Sore throat Patient AF, normal tonsils and no LAD, viral, discussed supportive measures and RTC precautions  2. Folliculitis Very mild, healing, no need fur further interventions at this time. Discussed RTC precautions.   Return if symptoms worsen or fail to improve.    Myles LippsIrma M Santiago, MD [facility] at [facility] [address] [place], [place] [address] Ph.   [phone] Fax [phone]

## 2017-08-26 ENCOUNTER — Telehealth: Payer: Self-pay | Admitting: Family Medicine

## 2017-08-26 NOTE — Telephone Encounter (Signed)
Copied from CRM [phone]#[id number]. Topic: Quick Communication - Rx Refill/Question >> Aug 26, 2017  8:01 AM Maia Pettiesrtiz, Kristie S wrote: Pts parents and sister have all been confirmed with the flu. Pt is asking for Tamiflu to be sent to pharmacy for her. She is not having sx currently. Please notify pt - ok to leave detailed VM.  [id number] - [place],  - [address] [facility] [address] [phone] (Phone) [phone] (Fax)

## 2017-08-26 NOTE — Telephone Encounter (Signed)
Spoke to Big Cree[place]antiago and she already talked to mom and will not be prescribing

## 2017-09-06 ENCOUNTER — Telehealth: Payer: Self-pay | Admitting: Obstetrics and Gynecology

## 2017-09-06 NOTE — Telephone Encounter (Signed)
Patient called to discuss emergency contraception. Stated that she used it a few weeks ago and now has questions.

## 2017-09-06 NOTE — Telephone Encounter (Signed)
Spoke with patient. Patient states that she took Plan B on 10/18/2017 which was 12-13 hours having intercourse. Condom broke during intercourse. Patient states that her menses started 2 weeks early. Advised patient it is common to have your menses early after taking Plan B. Patient is asking when she can take a pregnancy test. Advised may take a pregnancy 3 weeks after taking Plan B, 09/08/2017 if she still has concerns regarding pregnancy. Advised having her menses early is common after taking the medication. Patient verbalizes understanding. Reports she may take UPT. Will report if positive.  Routing to covering provider for final review. Patient agreeable to disposition. Will close encounter.

## 2017-09-25 ENCOUNTER — Encounter: Payer: Self-pay | Admitting: Obstetrics and Gynecology

## 2017-09-25 ENCOUNTER — Ambulatory Visit: Payer: Commercial Managed Care - PPO | Admitting: Obstetrics and Gynecology

## 2017-09-25 ENCOUNTER — Other Ambulatory Visit: Payer: Self-pay

## 2017-09-25 VITALS — BP 98/62 | HR 64 | Resp 14 | Wt 124.0 lb

## 2017-09-25 DIAGNOSIS — N941 Unspecified dyspareunia: Secondary | ICD-10-CM | POA: Diagnosis not present

## 2017-09-25 DIAGNOSIS — N909 Noninflammatory disorder of vulva and perineum, unspecified: Secondary | ICD-10-CM

## 2017-09-25 DIAGNOSIS — N9089 Other specified noninflammatory disorders of vulva and perineum: Secondary | ICD-10-CM

## 2017-09-25 NOTE — Progress Notes (Signed)
GYNECOLOGY  VISIT   HPI: 22 y.o.   Single  Caucasian  female   G0P0000 with Patient's last menstrual period was 09/15/2017.   here c/o painful "lump" on labia. It is on the left, has been present for a few months, getting bigger, no drainage. Slight erythema.    She is still having pain with intercourse. Even a finger hurts at the opening. No pain deeper inside. She is using dilators, up to the largest one. Tears externally when she is sexually active, using lubricants. Still having sex. Pain with sex is intermittent up to a 5/10 in severity, down from a 7/10 in severity.   GYNECOLOGIC HISTORY: Patient's last menstrual period was 09/15/2017. Contraception:OCP Menopausal hormone therapy: none         OB History    Gravida  0   Para  0   Term  0   Preterm  0   AB  0   Living  0     SAB  0   TAB  0   Ectopic  0   Multiple  0   Live Births  0              Patient Active Problem List   Diagnosis Date Noted  . 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 Costochondritis 02/04/2014  . Unspecified asthma(493.90) 04/20/2013  . Cat allergies 04/20/2013  . Seasonal allergies 04/20/2013    Past Medical History:  Diagnosis Date  . Acne    tx with spironolactone  . Allergy    Has allergies to dogs and cats.  . Anxiety    no meds  . Asthma    as a child, rarely uses inhaler  . Cat allergies 04/20/2013  . Depression    no meds  . GERD (gastroesophageal reflux disease)    occasional  . Migraine headache with aura    last one 04/2017, triggers lack of sleep-otc prn    Past Surgical History:  Procedure Laterality Date  . LESION REMOVAL N/A 05/28/2017   Procedure: PLASTICS REPAIR OF INTROIDUS;  Surgeon: Romualdo Bolk, MD;  Location: [facility];  Service: Gynecology;  Laterality: N/A;  . WISDOM TOOTH EXTRACTION      Current Outpatient Medications  Medication Sig Dispense Refill  . albuterol (PROVENTIL HFA;VENTOLIN HFA) 108 (90 BASE) MCG/ACT inhaler Inhale 2  puffs into the lungs every 6 (six) hours as needed for wheezing (One for school and one for home). 2 Inhaler 3  . cetirizine (ZYRTEC) 10 MG tablet Take 10 mg by mouth daily as needed (for allergies.).     Marland Kitchen famotidine (PEPCID AC) 10 MG chewable tablet Chew 10 mg by mouth daily as needed for heartburn.    . fluticasone (FLONASE) 50 MCG/ACT nasal spray Place 1 spray into both nostrils 2 (two) times daily as needed for allergies or rhinitis.    Marland Kitchen ibuprofen (ADVIL,MOTRIN) 200 MG tablet 3 tablets orally every 6 hours over the next 48 hours around the clock, then 3 tablets every 6 hours as needed for pain 30 tablet 0  . Naphazoline-Pheniramine (OPCON-A) 0.027-0.315 % SOLN Place 1 drop into both eyes 3 (three) times daily as needed (for irritated/itchy eyes.).    Marland Kitchen norethindrone (MICRONOR,CAMILA,ERRIN) 0.35 MG tablet Take 1 tablet (0.35 mg total) by mouth daily. 3 Package 3  . lidocaine (XYLOCAINE) 5 % ointment Apply a pea sized amount topically 20 minutes prior to intercourse (wipe off just prior to intercourse) and up to 3 x prn pain (Patient  not taking: Reported on 09/25/2017) 30 g 0   No current facility-administered medications for this visit.      ALLERGIES: Pollen extract  Family History  Problem Relation Age of Onset  . Hypertension Mother   . Hyperlipidemia Mother   . Scoliosis Mother   . Lupus Maternal Grandmother   . Osteoporosis Maternal Grandmother   . Heart disease Maternal Grandfather   . Hypertension Maternal Grandfather   . Diabetes Maternal Grandfather   . Emphysema Maternal Grandfather   . Heart attack Maternal Grandfather        occurred < 78 years old  . Hyperlipidemia Maternal Grandfather   . COPD Maternal Grandfather   . Cancer Maternal Grandfather        skin cancer  . Osteoarthritis Paternal Grandmother   . Diabetes Paternal Grandfather   . Heart disease Paternal Grandfather   . Kidney failure Paternal Grandfather        2 transplants due to DM  . Hypertension  Paternal Grandfather   . Hyperlipidemia Paternal Grandfather   . Stroke Paternal Grandfather   . Parkinson's disease Paternal Grandfather     Social History   Socioeconomic History  . Marital status: Single    Spouse name: Not on file  . Number of children: Not on file  . Years of education: Not on file  . Highest education level: Not on file  Occupational History  . Not on file  Social Needs  . Financial resource strain: Not on file  . Food insecurity:    Worry: Not on file    Inability: Not on file  . Transportation needs:    Medical: Not on file    Non-medical: Not on file  Tobacco Use  . Smoking status: Never Smoker  . Smokeless tobacco: Never Used  Substance and Sexual Activity  . Alcohol use: No  . Drug use: No  . Sexual activity: Yes    Birth control/protection: Condom, Pill    Comment: female/female partner  Lifestyle  . Physical activity:    Days per week: Not on file    Minutes per session: Not on file  . Stress: Not on file  Relationships  . Social connections:    Talks on phone: Not on file    Gets together: Not on file    Attends religious service: Not on file    Active member of club or organization: Not on file    Attends meetings of clubs or organizations: Not on file    Relationship status: Not on file  . Intimate partner violence:    Fear of current or ex partner: Not on file    Emotionally abused: Not on file    Physically abused: Not on file    Forced sexual activity: Not on file  Other Topics Concern  . Not on file  Social History Narrative   She lives at home with her parents and older sister. Family moved from [place] in Jan. 2014. She will be going into 10th grade in the fall. Her current hopes after high school are to become a nun. She denies use of tobacco, ethanol, or illicit substances. She denies every having sexual intercourse.    Review of Systems  Constitutional: Negative.   HENT: Negative.   Eyes: Negative.    Respiratory: Negative.   Cardiovascular: Negative.   Gastrointestinal: Negative.   Genitourinary:       Painful lump on labia   Musculoskeletal: Negative.   Skin: Negative.  Neurological: Negative.   Endo/Heme/Allergies: Negative.   Psychiatric/Behavioral: Negative.     PHYSICAL EXAMINATION:    BP 98/62 (BP Location: Right Arm, Patient Position: Sitting, Cuff Size: Normal)   Pulse 64   Resp 14   Wt 124 lb (56.2 kg)   LMP 09/15/2017   BMI 20.63 kg/m     General appearance: alert, cooperative and appears stated age  Pelvic: External genitalia:  no lesions, small ? Epidermal cyst (4 mm) at the superior left labia major, very slight erythema, mildly tender.  She has a 2-3 mm tear in the posterior perineum.              Urethra:  normal appearing urethra with no masses, tenderness or lesions              Bartholins and Skenes: normal                 Vagina: normal appearing vagina with normal color and discharge, no lesions. Able to insert 2 fingers vaginally (marked improvement)              Cervix: no lesions              Bimanual Exam:  Uterus:  normal size, contour, position, consistency, mobility, non-tender              Adnexa: no mass, fullness, tenderness              Chaperone was present for exam.  ASSESSMENT Epidermoid cyst, small, minimally tender, doesn't appear infected Continued dyspareunia, but improving. Vaginal dilators are helping, she can now use the large dilator Small amount of tearing with intercourse.     PLAN Reassured the vulvar lump/cyst is benign. If it gets tender she can apply warm compresses or do hot soaks.  Continue to use the vaginal dilators Use lubricant with intercourse, go slowly She has lidocaine ointment, reviewed how to use it (she will try it) Call if things aren't continuing to improve   An After Visit Summary was printed and given to the patient.

## 2017-09-26 ENCOUNTER — Other Ambulatory Visit: Payer: Self-pay

## 2017-09-26 ENCOUNTER — Ambulatory Visit: Payer: Commercial Managed Care - PPO | Admitting: Family Medicine

## 2017-09-26 ENCOUNTER — Encounter: Payer: Self-pay | Admitting: Family Medicine

## 2017-09-26 VITALS — BP 98/62 | HR 80 | Temp 98.6°F | Ht 65.0 in | Wt 124.4 lb

## 2017-09-26 DIAGNOSIS — M4120 Other idiopathic scoliosis, site unspecified: Secondary | ICD-10-CM | POA: Diagnosis not present

## 2017-09-26 DIAGNOSIS — M542 Cervicalgia: Secondary | ICD-10-CM

## 2017-09-26 DIAGNOSIS — G8929 Other chronic pain: Secondary | ICD-10-CM | POA: Diagnosis not present

## 2017-09-26 DIAGNOSIS — M546 Pain in thoracic spine: Secondary | ICD-10-CM | POA: Diagnosis not present

## 2017-09-26 LAB — POCT URINALYSIS DIP (MANUAL ENTRY)
Bilirubin, UA: NEGATIVE
Glucose, UA: NEGATIVE mg/dL
Ketones, POC UA: NEGATIVE mg/dL
Leukocytes, UA: NEGATIVE
Nitrite, UA: NEGATIVE
Protein Ur, POC: NEGATIVE mg/dL
Spec Grav, UA: 1.01 (ref 1.010–1.025)
Urobilinogen, UA: 0.2 E.U./dL
pH, UA: 7 (ref 5.0–8.0)

## 2017-09-26 MED ORDER — CYCLOBENZAPRINE HCL 5 MG PO TABS: 5.0000 mg | ORAL_TABLET | Freq: Three times a day (TID) | ORAL | 1 refills | Status: DC | PRN

## 2017-09-26 NOTE — Progress Notes (Addendum)
4/18/201910:45 AM  Judy [name] [birth year], 22 y.o. female [id number]  Chief Complaint  Patient presents with  . Pain    having back and neck pain    HPI:   Patient is a 22 y.o. female who presents today requesting xrays for scoliosis  She has long standing issues with neck and thoracic back pain. She gets pain between her shoulder blades, feels at time numbness and tingling, neck if stiff and sore. She manages with heat, ice, stretching, yoga, NSAIDs. Some days are better than others, today not bothering her. However overall, she feels frequency of sx is increasing.  She has had xrays in the past of her neck that have shown loss of lordosis, per patient  Her mother and maternal aunt both have scoliosis.  Depression screen [facility] 2/9 09/26/2017 08/21/2017 08/15/2017  Decreased Interest 0 0 0  Down, Depressed, Hopeless 0 0 0  PHQ - 2 Score 0 0 0    Allergies  Allergen Reactions  . Pollen Extract Other (See Comments)    Sneezing/itchy eyes/runny nose    Prior to Admission medications   Medication Sig Start Date End Date Taking? Authorizing Provider  albuterol (PROVENTIL HFA;VENTOLIN HFA) 108 (90 BASE) MCG/ACT inhaler Inhale 2 puffs into the lungs every 6 (six) hours as needed for wheezing (One for school and one for home). 03/01/14  Yes Perez-Fiery, Angelique Blonderenise, MD  cetirizine (ZYRTEC) 10 MG tablet Take 10 mg by mouth daily as needed (for allergies.).    Yes [provider]  famotidine (PEPCID AC) 10 MG chewable tablet Chew 10 mg by mouth daily as needed for heartburn.   Yes [provider]  fluticasone (FLONASE) 50 MCG/ACT nasal spray Place 1 spray into both nostrils 2 (two) times daily as needed for allergies or rhinitis.   Yes [provider]  ibuprofen (ADVIL,MOTRIN) 200 MG tablet 3 tablets orally every 6 hours over the next 48 hours around the clock, then 3 tablets every 6 hours as needed for pain 06/15/17  Yes Haygood, Maris BergerVanessa P, MD  lidocaine (XYLOCAINE) 5 %  ointment Apply a pea sized amount topically 20 minutes prior to intercourse (wipe off just prior to intercourse) and up to 3 x prn pain 08/01/17  Yes Romualdo BolkJertson, Jill Evelyn, MD  Multiple Vitamin (MULTIVITAMIN) capsule Take 1 capsule by mouth daily.   Yes [provider]  Naphazoline-Pheniramine (OPCON-A) 0.027-0.315 % SOLN Place 1 drop into both eyes 3 (three) times daily as needed (for irritated/itchy eyes.).   Yes [provider]  norethindrone (MICRONOR,CAMILA,ERRIN) 0.35 MG tablet Take 1 tablet (0.35 mg total) by mouth daily. 05/23/17  Yes Romualdo BolkJertson, Jill Evelyn, MD    Past Medical History:  Diagnosis Date  . Acne    tx with spironolactone  . Allergy    Has allergies to dogs and cats.  . Anxiety    no meds  . Asthma    as a child, rarely uses inhaler  . Cat allergies 04/20/2013  . Depression    no meds  . GERD (gastroesophageal reflux disease)    occasional  . Migraine headache with aura    last one 04/2017, triggers lack of sleep-otc prn    Past Surgical History:  Procedure Laterality Date  . LESION REMOVAL N/A 05/28/2017   Procedure: PLASTICS REPAIR OF INTROIDUS;  Surgeon: Romualdo BolkJertson, Jill Evelyn, MD;  Location: [facility];  Service: Gynecology;  Laterality: N/A;  . WISDOM TOOTH EXTRACTION      Social History   Tobacco Use  .  Smoking status: Never Smoker  . Smokeless tobacco: Never Used  Substance Use Topics  . Alcohol use: No    Family History  Problem Relation Age of Onset  . Hypertension Mother   . Hyperlipidemia Mother   . Scoliosis Mother   . Lupus Maternal Grandmother   . Osteoporosis Maternal Grandmother   . Heart disease Maternal Grandfather   . Hypertension Maternal Grandfather   . Diabetes Maternal Grandfather   . Emphysema Maternal Grandfather   . Heart attack Maternal Grandfather        occurred < 30 years old  . Hyperlipidemia Maternal Grandfather   . COPD Maternal Grandfather   . Cancer Maternal Grandfather        skin cancer  .  Osteoarthritis Paternal Grandmother   . Diabetes Paternal Grandfather   . Heart disease Paternal Grandfather   . Kidney failure Paternal Grandfather        2 transplants due to DM  . Hypertension Paternal Grandfather   . Hyperlipidemia Paternal Grandfather   . Stroke Paternal Grandfather   . Parkinson's disease Paternal Grandfather     ROS Per hpi  OBJECTIVE:  Blood pressure 98/62, pulse 80, temperature 98.6 F (37 C), temperature source Oral, height 5\' 5"  (1.651 m), weight 124 lb 6.4 oz (56.4 kg), last menstrual period 09/15/2017, SpO2 100 %.  Physical Exam  Constitutional: She is oriented to person, place, and time.  HENT:  Head: Normocephalic and atraumatic.  Mouth/Throat: Mucous membranes are normal.  Eyes: Pupils are equal, round, and reactive to light. EOM are normal. No scleral icterus.  Neck: Neck supple.  Pulmonary/Chest: Effort normal.  Musculoskeletal:       Cervical back: Normal.       Thoracic back: She exhibits deformity (slight curvature towards the right). She exhibits normal range of motion, no tenderness and no bony tenderness.  Neurological: She is alert and oriented to person, place, and time.  Skin: Skin is warm and dry.  Nursing note and vitals reviewed.     Results for orders placed or performed in visit on 09/26/17 (from the past 24 hour(s))  POCT urinalysis dipstick     Status: Abnormal   Collection Time: 09/26/17 10:28 AM  Result Value Ref Range   Color, UA yellow yellow   Clarity, UA cloudy (A) clear   Glucose, UA negative negative mg/dL   Bilirubin, UA negative negative   Ketones, POC UA negative negative mg/dL   Spec Grav, UA 1.610 9.604 - 1.025   Blood, UA trace-intact (A) negative   pH, UA 7.0 5.0 - 8.0   Protein Ur, POC negative negative mg/dL   Urobilinogen, UA 0.2 0.2 or 1.0 E.U./dL   Nitrite, UA Negative Negative   Leukocytes, UA Negative Negative    ASSESSMENT and PLAN  1. Chronic bilateral thoracic back pain Continue with  conservative measures, referring to PT, ordering initial xrays to evaluate for scoliosis.  - POCT urinalysis dipstick - DG SCOLIOSIS EVAL COMPLETE SPINE 2 OR 3 VIEWS; Future - Ambulatory referral to Physical Therapy -cyclobenzaprine 5mg  by mouth TID as needed  2. Scoliosis (and kyphoscoliosis), idiopathic - DG SCOLIOSIS EVAL COMPLETE SPINE 2 OR 3 VIEWS; Future - Ambulatory referral to Physical Therapy  3. Neck pain, chronic - Ambulatory referral to Physical Therapy  Return if symptoms worsen or fail to improve.    Myles Lipps, MD Primary Care at [facility] [address] [place], [place] [address] Ph.  [phone] Fax [phone]

## 2017-09-26 NOTE — Addendum Note (Signed)
Addended by: Myles LippsSANTIAGO, Lucas Exline M on: 09/26/2017 11:16 AM   Modules accepted: Orders

## 2017-09-26 NOTE — Patient Instructions (Signed)
     IF you received an x-ray today, you will receive an invoice from [facility]. Please contact [facility] at [phone] with questions or concerns regarding your invoice.   IF you received labwork today, you will receive an invoice from [facility]. Please contact [facility] at [phone] with questions or concerns regarding your invoice.   Our billing staff will not be able to assist you with questions regarding bills from these companies.  You will be contacted with the lab results as soon as they are available. The fastest way to get your results is to activate your My Chart account. Instructions are located on the last page of this paperwork. If you have not heard from us regarding the results in 2 weeks, please contact this office.     

## 2017-09-30 ENCOUNTER — Telehealth: Payer: Self-pay | Admitting: Obstetrics and Gynecology

## 2017-09-30 NOTE — Telephone Encounter (Signed)
Spoke with patient. Reports lump on left labia has gotten larger. Describes as dime sized, red, tender, only painful when touched. No drainage or d/c. Patient last seen on 4/17, has not tried warm compresses or hot soaks.   Denies fever/chills.  OV scheduled for 4/25 at 10:15am with Dr. Oscar LaJertson for evaluation. Advised to try warm compress and/or hot soaks in the meantime. Advised will review options of biopsy, drainage or removal with Dr. Oscar LaJertson, is aware this may not be determined until evaluated, and return call.   Dr. Reyne DumasJerston - please review and advise if Biopsy or drainage is an option?

## 2017-09-30 NOTE — Telephone Encounter (Signed)
Patient was seen last week for a lump on her labia. Patient thinks the lump has grown in size and would like to have this removed and biopsied. Patient asked if this could be done in office?

## 2017-09-30 NOTE — Telephone Encounter (Signed)
Spoke with patient, advised as seen below per Dr. Oscar LaJertson. Patient verbalizes understanding and is agreeable, will close encounter.

## 2017-09-30 NOTE — Telephone Encounter (Signed)
It would depend on how big it is. Usually use conservative measures unless it is 2 cm. I'm happy to see her and help her figure it out.

## 2017-10-03 ENCOUNTER — Other Ambulatory Visit: Payer: Self-pay

## 2017-10-03 ENCOUNTER — Encounter: Payer: Self-pay | Admitting: Obstetrics and Gynecology

## 2017-10-03 ENCOUNTER — Ambulatory Visit: Payer: Commercial Managed Care - PPO | Admitting: Obstetrics and Gynecology

## 2017-10-03 VITALS — BP 100/60 | HR 64 | Resp 14 | Wt 126.0 lb

## 2017-10-03 DIAGNOSIS — N9089 Other specified noninflammatory disorders of vulva and perineum: Secondary | ICD-10-CM

## 2017-10-03 DIAGNOSIS — Z3009 Encounter for other general counseling and advice on contraception: Secondary | ICD-10-CM | POA: Diagnosis not present

## 2017-10-03 MED ORDER — SULFAMETHOXAZOLE-TRIMETHOPRIM 800-160 MG PO TABS: 1.0000 | ORAL_TABLET | Freq: Two times a day (BID) | ORAL | 0 refills | Status: DC

## 2017-10-03 NOTE — Progress Notes (Signed)
GYNECOLOGY  VISIT   HPI: 22 y.o.   Single  Caucasian  female   G0P0000 with Patient's last menstrual period was 09/15/2017.   here c/o vulvar lump. Patient feels the lump is getting larger and is tender. No drainage. She is wanting a different contraception. She doesn't like having to take the pill.  She has been using vaginal dilators, they are helping a lot.      GYNECOLOGIC HISTORY: Patient's last menstrual period was 09/15/2017. Contraception: OCP Menopausal hormone therapy: none         OB History    Gravida  0   Para  0   Term  0   Preterm  0   AB  0   Living  0     SAB  0   TAB  0   Ectopic  0   Multiple  0   Live Births  0              Patient Active Problem List   Diagnosis Date Noted  . 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 Costochondritis 02/04/2014  . Unspecified asthma(493.90) 04/20/2013  . Cat allergies 04/20/2013  . Seasonal allergies 04/20/2013    Past Medical History:  Diagnosis Date  . Acne    tx with spironolactone  . Allergy    Has allergies to dogs and cats.  . Anxiety    no meds  . Asthma    as a child, rarely uses inhaler  . Cat allergies 04/20/2013  . Depression    no meds  . GERD (gastroesophageal reflux disease)    occasional  . Migraine headache with aura    last one 04/2017, triggers lack of sleep-otc prn    Past Surgical History:  Procedure Laterality Date  . LESION REMOVAL N/A 05/28/2017   Procedure: PLASTICS REPAIR OF INTROIDUS;  Surgeon: Romualdo Bolk, MD;  Location: [facility];  Service: Gynecology;  Laterality: N/A;  . WISDOM TOOTH EXTRACTION      Current Outpatient Medications  Medication Sig Dispense Refill  . albuterol (PROVENTIL HFA;VENTOLIN HFA) 108 (90 BASE) MCG/ACT inhaler Inhale 2 puffs into the lungs every 6 (six) hours as needed for wheezing (One for school and one for home). 2 Inhaler 3  . cetirizine (ZYRTEC) 10 MG tablet Take 10 mg by mouth daily as needed (for allergies.).     Marland Kitchen  cyclobenzaprine (FLEXERIL) 5 MG tablet Take 1 tablet (5 mg total) by mouth 3 (three) times daily as needed for muscle spasms. 30 tablet 1  . famotidine (PEPCID AC) 10 MG chewable tablet Chew 10 mg by mouth daily as needed for heartburn.    . fluticasone (FLONASE) 50 MCG/ACT nasal spray Place 1 spray into both nostrils 2 (two) times daily as needed for allergies or rhinitis.    Marland Kitchen ibuprofen (ADVIL,MOTRIN) 200 MG tablet 3 tablets orally every 6 hours over the next 48 hours around the clock, then 3 tablets every 6 hours as needed for pain 30 tablet 0  . lidocaine (XYLOCAINE) 5 % ointment Apply a pea sized amount topically 20 minutes prior to intercourse (wipe off just prior to intercourse) and up to 3 x prn pain 30 g 0  . Multiple Vitamin (MULTIVITAMIN) capsule Take 1 capsule by mouth daily.    . Naphazoline-Pheniramine (OPCON-A) 0.027-0.315 % SOLN Place 1 drop into both eyes 3 (three) times daily as needed (for irritated/itchy eyes.).    Marland Kitchen norethindrone (MICRONOR,CAMILA,ERRIN) 0.35 MG tablet Take 1 tablet (0.35 mg total)  by mouth daily. 3 Package 3   No current facility-administered medications for this visit.      ALLERGIES: Pollen extract  Family History  Problem Relation Age of Onset  . Hypertension Mother   . Hyperlipidemia Mother   . Scoliosis Mother   . Lupus Maternal Grandmother   . Osteoporosis Maternal Grandmother   . Heart disease Maternal Grandfather   . Hypertension Maternal Grandfather   . Diabetes Maternal Grandfather   . Emphysema Maternal Grandfather   . Heart attack Maternal Grandfather        occurred < 48 years old  . Hyperlipidemia Maternal Grandfather   . COPD Maternal Grandfather   . Cancer Maternal Grandfather        skin cancer  . Osteoarthritis Paternal Grandmother   . Diabetes Paternal Grandfather   . Heart disease Paternal Grandfather   . Kidney failure Paternal Grandfather        2 transplants due to DM  . Hypertension Paternal Grandfather   .  Hyperlipidemia Paternal Grandfather   . Stroke Paternal Grandfather   . Parkinson's disease Paternal Grandfather     Social History   Socioeconomic History  . Marital status: Single    Spouse name: Not on file  . Number of children: Not on file  . Years of education: Not on file  . Highest education level: Not on file  Occupational History  . Not on file  Social Needs  . Financial resource strain: Not on file  . Food insecurity:    Worry: Not on file    Inability: Not on file  . Transportation needs:    Medical: Not on file    Non-medical: Not on file  Tobacco Use  . Smoking status: Never Smoker  . Smokeless tobacco: Never Used  Substance and Sexual Activity  . Alcohol use: No  . Drug use: No  . Sexual activity: Yes    Birth control/protection: Condom, Pill    Comment: female/female partner  Lifestyle  . Physical activity:    Days per week: Not on file    Minutes per session: Not on file  . Stress: Not on file  Relationships  . Social connections:    Talks on phone: Not on file    Gets together: Not on file    Attends religious service: Not on file    Active member of club or organization: Not on file    Attends meetings of clubs or organizations: Not on file    Relationship status: Not on file  . Intimate partner violence:    Fear of current or ex partner: Not on file    Emotionally abused: Not on file    Physically abused: Not on file    Forced sexual activity: Not on file  Other Topics Concern  . Not on file  Social History Narrative  . Not on file    Review of Systems  Constitutional: Negative.   HENT: Negative.   Eyes: Negative.   Respiratory: Negative.   Cardiovascular: Negative.   Gastrointestinal: Negative.   Genitourinary:       Vulvar lump   Musculoskeletal: Negative.   Skin: Negative.   Neurological: Negative.   Endo/Heme/Allergies: Negative.   Psychiatric/Behavioral: Negative.     PHYSICAL EXAMINATION:    BP 100/60 (BP Location:  Right Arm, Patient Position: Sitting, Cuff Size: Normal)   Pulse 64   Resp 14   Wt 126 lb (57.2 kg)   LMP 09/15/2017   BMI 20.97 kg/m  General appearance: alert, cooperative and appears stated age  Pelvic: External genitalia:  On the upper left labia majora is a pea sized, slightly tender lump, slight overlying erythema.               Urethra:  normal appearing urethra with no masses, tenderness or lesions              Bartholins and Skenes: normal                 Vagina: normal appearing vagina with normal color and discharge, no lesions. Able to insert a pederson speculum              Cervix: no lesions              Bimanual Exam:  Uterus:  normal size, contour, position, consistency, mobility, non-tender, anteverted              Adnexa: no mass, fullness, tenderness               Chaperone was present for exam.  ASSESSMENT Vulvar lump, suspect epidermoid cyst, possibly small abscess. Tender Contraception Dyspareunia, improved with vaginal dilators    PLAN Told her to try not to check the bump (that could be making it tender) Will do a 5 day course of bactrim, she can use hot soaks if needed I would not recommend removal at this time Discussed options for contraception, information given on the nexplanon and IUD's Still use condoms   An After Visit Summary was printed and given to the patient.  ~25 minutes face to face time of which over 50% was spent in counseling.

## 2017-10-22 ENCOUNTER — Telehealth: Payer: Self-pay | Admitting: Obstetrics and Gynecology

## 2017-10-22 NOTE — Telephone Encounter (Signed)
Patient called and states she is still continuing to have pain during intercourse and states her vagina tears. She is wanting to know if she needs to have another procedure to help with this. Please advise.

## 2017-10-22 NOTE — Telephone Encounter (Signed)
Spoke with patient. Patient states that she is continuing to use the vaginal dilators and is up to the largest size. When she has intercourse she is having pain in the lower portion of her vagina. Is using lubrication. Has noticed vaginal tearing with intercourse. Advised will need to return to the office for further evaluation. Patient is agreeable and will return call Thursday to schedule.  Routing to provider for final review. Patient agreeable to disposition. Will close encounter.

## 2017-11-08 ENCOUNTER — Encounter: Payer: Self-pay | Admitting: Family Medicine

## 2017-11-08 ENCOUNTER — Ambulatory Visit: Payer: Commercial Managed Care - PPO | Admitting: Family Medicine

## 2017-11-08 ENCOUNTER — Other Ambulatory Visit: Payer: Self-pay

## 2017-11-08 VITALS — BP 110/58 | HR 103 | Temp 98.5°F | Resp 16 | Ht 66.0 in | Wt 120.6 lb

## 2017-11-08 DIAGNOSIS — K120 Recurrent oral aphthae: Secondary | ICD-10-CM | POA: Diagnosis not present

## 2017-11-08 DIAGNOSIS — J029 Acute pharyngitis, unspecified: Secondary | ICD-10-CM

## 2017-11-08 LAB — POCT RAPID STREP A (OFFICE): Rapid Strep A Screen: NEGATIVE

## 2017-11-08 NOTE — Progress Notes (Signed)
5/31/201910:41 AM  Judy [name] [birth year], 22 y.o. female [id number]  Chief Complaint  Patient presents with  . Sore Throat    x 1 wk, on the right side    HPI:   Patient is a 22 y.o. female  who presents today for 1 week of sore throat on the right She has noticed a white lesion No fever, swollen glands or URI sx No SOB or drooling She wants to make sure it is nothing before she goes away on a 3 day camping trip   Fall Risk  11/08/2017 09/26/2017 08/21/2017 08/15/2017 05/31/2017  Falls in the past year? No No No No No     Depression screen [facility] 2/9 11/08/2017 09/26/2017 08/21/2017  Decreased Interest 0 0 0  Down, Depressed, Hopeless 0 0 0  PHQ - 2 Score 0 0 0    Allergies  Allergen Reactions  . Pollen Extract Other (See Comments)    Sneezing/itchy eyes/runny nose    Prior to Admission medications   Medication Sig Start Date End Date Taking? Authorizing Provider  albuterol (PROVENTIL HFA;VENTOLIN HFA) 108 (90 BASE) MCG/ACT inhaler Inhale 2 puffs into the lungs every 6 (six) hours as needed for wheezing (One for school and one for home). 03/01/14  Yes Perez-Fiery, Angelique Blonder, MD  cyclobenzaprine (FLEXERIL) 5 MG tablet Take 1 tablet (5 mg total) by mouth 3 (three) times daily as needed for muscle spasms. 09/26/17  Yes Myles Lipps, MD  famotidine (PEPCID AC) 10 MG chewable tablet Chew 10 mg by mouth daily as needed for heartburn.   Yes [provider]  fluticasone (FLONASE) 50 MCG/ACT nasal spray Place 1 spray into both nostrils 2 (two) times daily as needed for allergies or rhinitis.   Yes [provider]  ibuprofen (ADVIL,MOTRIN) 200 MG tablet 3 tablets orally every 6 hours over the next 48 hours around the clock, then 3 tablets every 6 hours as needed for pain 06/15/17  Yes Haygood, Maris Berger, MD  Multiple Vitamin (MULTIVITAMIN) capsule Take 1 capsule by mouth daily.   Yes [provider]  Naphazoline-Pheniramine (OPCON-A) 0.027-0.315 % SOLN Place 1 drop  into both eyes 3 (three) times daily as needed (for irritated/itchy eyes.).   Yes [provider]  cetirizine (ZYRTEC) 10 MG tablet Take 10 mg by mouth daily as needed (for allergies.).     [provider]    Past Medical History:  Diagnosis Date  . Acne    tx with spironolactone  . Allergy    Has allergies to dogs and cats.  . Anxiety    no meds  . Asthma    as a child, rarely uses inhaler  . Cat allergies 04/20/2013  . Depression    no meds  . GERD (gastroesophageal reflux disease)    occasional  . Migraine headache with aura    last one 04/2017, triggers lack of sleep-otc prn    Past Surgical History:  Procedure Laterality Date  . LESION REMOVAL N/A 05/28/2017   Procedure: PLASTICS REPAIR OF INTROIDUS;  Surgeon: Romualdo Bolk, MD;  Location: [facility];  Service: Gynecology;  Laterality: N/A;  . WISDOM TOOTH EXTRACTION      Social History   Tobacco Use  . Smoking status: Never Smoker  . Smokeless tobacco: Never Used  Substance Use Topics  . Alcohol use: No    Family History  Problem Relation Age of Onset  . Hypertension Mother   . Hyperlipidemia Mother   . Scoliosis Mother   .  Lupus Maternal Grandmother   . Osteoporosis Maternal Grandmother   . Heart disease Maternal Grandfather   . Hypertension Maternal Grandfather   . Diabetes Maternal Grandfather   . Emphysema Maternal Grandfather   . Heart attack Maternal Grandfather        occurred < 59 years old  . Hyperlipidemia Maternal Grandfather   . COPD Maternal Grandfather   . Cancer Maternal Grandfather        skin cancer  . Osteoarthritis Paternal Grandmother   . Diabetes Paternal Grandfather   . Heart disease Paternal Grandfather   . Kidney failure Paternal Grandfather        2 transplants due to DM  . Hypertension Paternal Grandfather   . Hyperlipidemia Paternal Grandfather   . Stroke Paternal Grandfather   . Parkinson's disease Paternal Grandfather     ROS Per  hpi  OBJECTIVE:  Blood pressure (!) 110/58, pulse (!) 103, temperature 98.5 F (36.9 C), temperature source Oral, resp. rate 16, height  (1.676 m), weight 120 lb 9.6 oz (54.7 kg), last menstrual period 11/06/2017, SpO2 100 %.  Physical Exam  Constitutional: She appears well-developed and well-nourished.  HENT:  Head: Normocephalic and atraumatic.  Right Ear: Hearing, tympanic membrane and ear canal normal.  Left Ear: Hearing, tympanic membrane and ear canal normal.  Mouth/Throat: Mucous membranes are normal. No oropharyngeal exudate or posterior oropharyngeal erythema. Tonsils are 1+ on the right. Tonsils are 1+ on the left.    Eyes: Pupils are equal, round, and reactive to light. EOM are normal.  Neck: Neck supple.  Lymphadenopathy:    She has no cervical adenopathy.  Nursing note and vitals reviewed.     Results for orders placed or performed in visit on 11/08/17 (from the past 24 hour(s))  POCT rapid strep A     Status: Normal   Collection Time: 11/08/17 10:56 AM  Result Value Ref Range   Rapid Strep A Screen Negative Negative      ASSESSMENT and PLAN  1. Canker sore Discussed supportive measures, patient educational handout given.   2. Sore throat - POCT rapid strep A - Culture, Group A Strep  Return if symptoms worsen or fail to improve.    Myles Lipps, MD Primary Care at [facility] [address] [place], [place] [address] Ph.  [phone] Fax [phone]

## 2017-11-08 NOTE — Patient Instructions (Addendum)
[facility] [address] [phone]   IF you received an x-ray today, you will receive an invoice from [facility]. Please contact [facility] at [phone] with questions or concerns regarding your invoice.   IF you received labwork today, you will receive an invoice from [facility]. Please contact [facility] at [phone] with questions or concerns regarding your invoice.   Our billing staff will not be able to assist you with questions regarding bills from these companies.  You will be contacted with the lab results as soon as they are available. The fastest way to get your results is to activate your My Chart account. Instructions are located on the last page of this paperwork. If you have not heard from [place] regarding the results in 2 weeks, please contact this office.     Canker Sores Canker sores are small, painful sores that develop inside your mouth. They may also be called aphthous ulcers. You can get canker sores on the inside of your lips or cheeks, on your tongue, or anywhere inside your mouth. You can have just one canker sore or several of them. Canker sores cannot be passed from one person to another (noncontagious). These sores are different than the sores that you may get on the outside of your lips (cold sores or fever blisters). Canker sores usually start as painful red bumps. Then they turn into small white, yellow, or gray ulcers that have red borders. The ulcers may be quite painful. The pain may be worse when you eat or drink. What are the causes? The cause of this condition is not known. What increases the risk? This condition is more likely to develop in:  Women.  People in their teens or 3s.  Women who are having their menstrual period.  People who are under a lot of emotional stress.  People who do not get enough iron or B vitamins.  People who have poor oral hygiene.  People who have an injury inside the mouth. This can  happen after having dental work or from chewing something hard.  What are the signs or symptoms? Along with the canker sore, symptoms may also include:  Fever.  Fatigue.  Swollen lymph nodes in your neck.  How is this diagnosed? This condition can be diagnosed based on your symptoms. Your health care provider will also examine your mouth. Your health care provider may also do tests if you get canker sores often or if they are very bad. Tests may include:  Blood tests to rule out other causes of canker sores.  Taking swabs from the sore to check for infection.  Taking a small piece of skin from the sore (biopsy) to test it for cancer.  How is this treated? Most canker sores clear up without treatment in about 10 days. Home care is usually the only treatment that you will need. Over-the-counter medicines can relieve discomfort.If you have severe canker sores, your health care provider may prescribe:  Numbing ointment to relieve pain.  Vitamins.  Steroid medicines. These may be given as: ? Oral pills. ? Mouth rinses. ? Gels.  Antibiotic mouth rinse.  Follow these instructions at home:  Apply, take, or use medicines only as directed by your health care provider. These include vitamins.  If you were prescribed an antibiotic mouth rinse, finish all of it even if you start to feel better.  Until the sores are healed: ? Do not drink coffee or citrus juices. ? Do not eat spicy or salty foods.  Use a mild, over-the-counter mouth rinse as directed by your health care provider.  Practice good oral hygiene. ? Floss your teeth every day. ? Brush your teeth with a soft brush twice each day. Contact a health care provider if:  Your symptoms do not get better after two weeks.  You also have a fever or swollen glands.  You get canker sores often.  You have a canker sore that is getting larger.  You cannot eat or drink due to your canker sores. This information is not  intended to replace advice given to you by your health care provider. Make sure you discuss any questions you have with your health care provider. Document Released: 09/22/2010 Document Revised: 11/03/2015 Document Reviewed: 04/28/2014 Elsevier Interactive Patient Education  [employer]2018 [employer]

## 2017-11-11 LAB — CULTURE, GROUP A STREP: Strep A Culture: NEGATIVE

## 2017-12-04 ENCOUNTER — Ambulatory Visit
Admission: RE | Admit: 2017-12-04 | Discharge: 2017-12-04 | Disposition: A | Discharge status: Home / Self Care | Payer: Commercial Managed Care - PPO | Source: Ambulatory Visit | Attending: Family Medicine | Admitting: Family Medicine

## 2017-12-04 ENCOUNTER — Other Ambulatory Visit: Payer: Self-pay | Admitting: Family Medicine

## 2017-12-04 DIAGNOSIS — G8929 Other chronic pain: Secondary | ICD-10-CM

## 2017-12-04 DIAGNOSIS — M4120 Other idiopathic scoliosis, site unspecified: Secondary | ICD-10-CM

## 2017-12-04 DIAGNOSIS — M546 Pain in thoracic spine: Principal | ICD-10-CM

## 2017-12-09 ENCOUNTER — Telehealth: Payer: Self-pay | Admitting: Obstetrics and Gynecology

## 2017-12-09 NOTE — Telephone Encounter (Signed)
Left message to call Sigurd Pugh at [phone].  

## 2017-12-09 NOTE — Telephone Encounter (Signed)
Patient stated she is having pain after sex, and would like to discuss the possibility of getting an IUD.

## 2017-12-09 NOTE — Telephone Encounter (Signed)
Spoke with patient. Patient states she has continued to use vaginal dilators, but still experiencing dyspareunia. No current tears or lesions, asking what can be done to prevent this? Patient also requesting to discuss IUD options. Denies any other GYN symptoms.  Recommended OV for further discussion with Dr. Oscar LaJertson. OV scheduled for 7/2 at 1:30pm with Dr. Oscar LaJertson.   Routing to provider for final review. Patient is agreeable to disposition. Will close encounter.

## 2017-12-10 ENCOUNTER — Ambulatory Visit: Payer: Commercial Managed Care - PPO | Admitting: Obstetrics and Gynecology

## 2017-12-10 ENCOUNTER — Other Ambulatory Visit: Payer: Self-pay

## 2017-12-10 ENCOUNTER — Encounter: Payer: Self-pay | Admitting: Obstetrics and Gynecology

## 2017-12-10 VITALS — BP 121/81 | HR 64 | Wt 118.6 lb

## 2017-12-10 DIAGNOSIS — N941 Unspecified dyspareunia: Secondary | ICD-10-CM | POA: Diagnosis not present

## 2017-12-10 DIAGNOSIS — Z3009 Encounter for other general counseling and advice on contraception: Secondary | ICD-10-CM

## 2017-12-10 MED ORDER — LIDOCAINE 5 % EX OINT: 1.0000 "application " | TOPICAL_OINTMENT | Freq: Four times a day (QID) | CUTANEOUS | 0 refills | Status: DC | PRN

## 2017-12-10 NOTE — Progress Notes (Deleted)
GYNECOLOGY  VISIT   HPI: 22 y.o.   Single  Caucasian  female   [id number] with Patient's last menstrual period was 12/05/2017 (exact date).   here for [facility] IUD insertion.    GYNECOLOGIC HISTORY: Patient's last menstrual period was 12/05/2017 (exact date). Contraception:Condoms        OB History    Gravida  0   Para  0   Term  0   Preterm  0   AB  0   Living  0     SAB  0   TAB  0   Ectopic  0   Multiple  0   Live Births  0              Patient Active Problem List   Diagnosis Date Noted  . 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 Costochondritis 02/04/2014  . Unspecified asthma(493.90) 04/20/2013  . Cat allergies 04/20/2013  . Seasonal allergies 04/20/2013    Past Medical History:  Diagnosis Date  . Acne    tx with spironolactone  . Allergy    Has allergies to dogs and cats.  . Anxiety    no meds  . Asthma    as a child, rarely uses inhaler  . Cat allergies 04/20/2013  . Depression    no meds  . GERD (gastroesophageal reflux disease)    occasional  . Migraine headache with aura    last one 04/2017, triggers lack of sleep-otc prn    Past Surgical History:  Procedure Laterality Date  . LESION REMOVAL N/A 05/28/2017   Procedure: PLASTICS REPAIR OF INTROIDUS;  Surgeon: Romualdo Bolk, MD;  Location: [facility];  Service: Gynecology;  Laterality: N/A;  . WISDOM TOOTH EXTRACTION      Current Outpatient Medications  Medication Sig Dispense Refill  . albuterol (PROVENTIL HFA;VENTOLIN HFA) 108 (90 BASE) MCG/ACT inhaler Inhale 2 puffs into the lungs every 6 (six) hours as needed for wheezing (One for school and one for home). 2 Inhaler 3  . cetirizine (ZYRTEC) 10 MG tablet Take 10 mg by mouth daily as needed (for allergies.).     Marland Kitchen cyclobenzaprine (FLEXERIL) 5 MG tablet Take 1 tablet (5 mg total) by mouth 3 (three) times daily as needed for muscle spasms. 30 tablet 1  . famotidine (PEPCID AC) 10 MG chewable tablet Chew 10 mg by mouth daily as  needed for heartburn.    . fluticasone (FLONASE) 50 MCG/ACT nasal spray Place 1 spray into both nostrils 2 (two) times daily as needed for allergies or rhinitis.    Marland Kitchen ibuprofen (ADVIL,MOTRIN) 200 MG tablet 3 tablets orally every 6 hours over the next 48 hours around the clock, then 3 tablets every 6 hours as needed for pain 30 tablet 0  . lidocaine (XYLOCAINE) 5 % ointment Apply 1 application topically 4 (four) times daily as needed. 30 g 0  . Multiple Vitamin (MULTIVITAMIN) capsule Take 1 capsule by mouth daily.    . Naphazoline-Pheniramine (OPCON-A) 0.027-0.315 % SOLN Place 1 drop into both eyes 3 (three) times daily as needed (for irritated/itchy eyes.).     No current facility-administered medications for this visit.      ALLERGIES: Pollen extract  Family History  Problem Relation Age of Onset  . Hypertension Mother   . Hyperlipidemia Mother   . Scoliosis Mother   . Lupus Maternal Grandmother   . Osteoporosis Maternal Grandmother   . Heart disease Maternal Grandfather   . Hypertension Maternal Grandfather   .  Diabetes Maternal Grandfather   . Emphysema Maternal Grandfather   . Heart attack Maternal Grandfather        occurred < 62 years old  . Hyperlipidemia Maternal Grandfather   . COPD Maternal Grandfather   . Cancer Maternal Grandfather        skin cancer  . Osteoarthritis Paternal Grandmother   . Diabetes Paternal Grandfather   . Heart disease Paternal Grandfather   . Kidney failure Paternal Grandfather        2 transplants due to DM  . Hypertension Paternal Grandfather   . Hyperlipidemia Paternal Grandfather   . Stroke Paternal Grandfather   . Parkinson's disease Paternal Grandfather     Social History   Socioeconomic History  . Marital status: Single    Spouse name: Not on file  . Number of children: Not on file  . Years of education: Not on file  . Highest education level: Not on file  Occupational History  . Not on file  Social Needs  . Financial  resource strain: Not on file  . Food insecurity:    Worry: Not on file    Inability: Not on file  . Transportation needs:    Medical: Not on file    Non-medical: Not on file  Tobacco Use  . Smoking status: Never Smoker  . Smokeless tobacco: Never Used  Substance and Sexual Activity  . Alcohol use: No  . Drug use: No  . Sexual activity: Not Currently    Birth control/protection: Condom    Comment: female/female partner  Lifestyle  . Physical activity:    Days per week: Not on file    Minutes per session: Not on file  . Stress: Not on file  Relationships  . Social connections:    Talks on phone: Not on file    Gets together: Not on file    Attends religious service: Not on file    Active member of club or organization: Not on file    Attends meetings of clubs or organizations: Not on file    Relationship status: Not on file  . Intimate partner violence:    Fear of current or ex partner: Not on file    Emotionally abused: Not on file    Physically abused: Not on file    Forced sexual activity: Not on file  Other Topics Concern  . Not on file  Social History Narrative  . Not on file    ROS  PHYSICAL EXAMINATION:    LMP 12/05/2017 (Exact Date)     General appearance: alert, cooperative and appears stated age Neck: no adenopathy, supple, symmetrical, trachea midline and thyroid {CHL AMB PHY EX THYROID NORM DEFAULT:[phone]::"normal to inspection and palpation"} Breasts: {Exam; breast:13139::"normal appearance, no masses or tenderness"} Abdomen: soft, non-tender; non distended, no masses,  no organomegaly  Pelvic: External genitalia:  no lesions              Urethra:  normal appearing urethra with no masses, tenderness or lesions              Bartholins and Skenes: normal                 Vagina: normal appearing vagina with normal color and discharge, no lesions              Cervix: {CHL AMB PHY EX CERVIX NORM DEFAULT:[phone]::"no lesions"}              Bimanual  Exam:  Uterus:  {  CHL AMB PHY EX UTERUS NORM DEFAULT:[phone]::"normal size, contour, position, consistency, mobility, non-tender"}              Adnexa: {CHL AMB PHY EX ADNEXA NO MASS [phone]::"no mass, fullness, tenderness"}              Rectovaginal: {yes no:[id number]}.  Confirms.              Anus:  normal sphincter tone, no lesions  Chaperone was present for exam.  ASSESSMENT     PLAN    An After Visit Summary was printed and given to the patient.  *** minutes face to face time of which over 50% was spent in counseling.

## 2017-12-10 NOTE — Progress Notes (Signed)
GYNECOLOGY  VISIT   HPI: 22 y.o.   Single  Caucasian  female   G0P0000 with Patient's last menstrual period was 12/05/2017 (exact date).   here for ongoing dyspareunia. No current tears or lesions. Patient also requesting to discuss IUD options. 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 She has had issues with pain and tearing of the skin since her surgery. She has used lubricants and vaginal dilators. Penetration is okay, doesn't hurt until after. Hasn't tried the lidocaine ointment she has. Always tears in the same area. She is controlling rate and depth of penetration. Using condoms. She has a new partner, hasn't been sexually active yet.  She is also interested in getting an IUD. She stopped taking the pill last month when she and her boyfriend broke up. She is interested in the [place] or mirena. Prior to the pill, cycles monthly x 4-6 days. Changed her tampon every 3 hours, cramps could get bad.              GYNECOLOGIC HISTORY: Patient's last menstrual period was 12/05/2017 (exact date). Contraception:Condoms        OB History    Gravida  0   Para  0   Term  0   Preterm  0   AB  0   Living  0     SAB  0   TAB  0   Ectopic  0   Multiple  0   Live Births  0              Patient Active Problem List   Diagnosis Date Noted  . 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 Costochondritis 02/04/2014  . Unspecified asthma(493.90) 04/20/2013  . Cat allergies 04/20/2013  . Seasonal allergies 04/20/2013    Past Medical History:  Diagnosis Date  . Acne    tx with spironolactone  . Allergy    Has allergies to dogs and cats.  . Anxiety    no meds  . Asthma    as a child, rarely uses inhaler  . Cat allergies 04/20/2013  . Depression    no meds  . GERD (gastroesophageal reflux disease)    occasional  . Migraine headache with aura    last one 04/2017, triggers lack of sleep-otc prn    Past Surgical History:  Procedure  Laterality Date  . LESION REMOVAL N/A 05/28/2017   Procedure: PLASTICS REPAIR OF INTROIDUS;  Surgeon: Romualdo Bolk, MD;  Location: [facility];  Service: Gynecology;  Laterality: N/A;  . WISDOM TOOTH EXTRACTION      Current Outpatient Medications  Medication Sig Dispense Refill  . albuterol (PROVENTIL HFA;VENTOLIN HFA) 108 (90 BASE) MCG/ACT inhaler Inhale 2 puffs into the lungs every 6 (six) hours as needed for wheezing (One for school and one for home). 2 Inhaler 3  . cetirizine (ZYRTEC) 10 MG tablet Take 10 mg by mouth daily as needed (for allergies.).     Marland Kitchen cyclobenzaprine (FLEXERIL) 5 MG tablet Take 1 tablet (5 mg total) by mouth 3 (three) times daily as needed for muscle spasms. 30 tablet 1  . famotidine (PEPCID AC) 10 MG chewable tablet Chew 10 mg by mouth daily as needed for heartburn.    . fluticasone (FLONASE) 50 MCG/ACT nasal spray Place 1 spray into both nostrils 2 (two) times daily as needed for allergies or rhinitis.    Marland Kitchen ibuprofen (ADVIL,MOTRIN) 200 MG tablet 3 tablets orally every 6 hours over  the next 48 hours around the clock, then 3 tablets every 6 hours as needed for pain 30 tablet 0  . Multiple Vitamin (MULTIVITAMIN) capsule Take 1 capsule by mouth daily.    . Naphazoline-Pheniramine (OPCON-A) 0.027-0.315 % SOLN Place 1 drop into both eyes 3 (three) times daily as needed (for irritated/itchy eyes.).     No current facility-administered medications for this visit.      ALLERGIES: Pollen extract  Family History  Problem Relation Age of Onset  . Hypertension Mother   . Hyperlipidemia Mother   . Scoliosis Mother   . Lupus Maternal Grandmother   . Osteoporosis Maternal Grandmother   . Heart disease Maternal Grandfather   . Hypertension Maternal Grandfather   . Diabetes Maternal Grandfather   . Emphysema Maternal Grandfather   . Heart attack Maternal Grandfather        occurred < 20 years old  . Hyperlipidemia Maternal Grandfather   . COPD Maternal Grandfather    . Cancer Maternal Grandfather        skin cancer  . Osteoarthritis Paternal Grandmother   . Diabetes Paternal Grandfather   . Heart disease Paternal Grandfather   . Kidney failure Paternal Grandfather        2 transplants due to DM  . Hypertension Paternal Grandfather   . Hyperlipidemia Paternal Grandfather   . Stroke Paternal Grandfather   . Parkinson's disease Paternal Grandfather     Social History   Socioeconomic History  . Marital status: Single    Spouse name: Not on file  . Number of children: Not on file  . Years of education: Not on file  . Highest education level: Not on file  Occupational History  . Not on file  Social Needs  . Financial resource strain: Not on file  . Food insecurity:    Worry: Not on file    Inability: Not on file  . Transportation needs:    Medical: Not on file    Non-medical: Not on file  Tobacco Use  . Smoking status: Never Smoker  . Smokeless tobacco: Never Used  Substance and Sexual Activity  . Alcohol use: No  . Drug use: No  . Sexual activity: Not Currently    Birth control/protection: Condom    Comment: female/female partner  Lifestyle  . Physical activity:    Days per week: Not on file    Minutes per session: Not on file  . Stress: Not on file  Relationships  . Social connections:    Talks on phone: Not on file    Gets together: Not on file    Attends religious service: Not on file    Active member of club or organization: Not on file    Attends meetings of clubs or organizations: Not on file    Relationship status: Not on file  . Intimate partner violence:    Fear of current or ex partner: Not on file    Emotionally abused: Not on file    Physically abused: Not on file    Forced sexual activity: Not on file  Other Topics Concern  . Not on file  Social History Narrative  . Not on file    Review of Systems  Constitutional: Negative.   HENT: Negative.   Eyes: Negative.   Respiratory: Negative.   Cardiovascular:  Negative.   Gastrointestinal: Negative.   Genitourinary:       Pain with intercourse  Musculoskeletal: Negative.   Skin: Negative.   Neurological: Negative.  Endo/Heme/Allergies: Negative.   Psychiatric/Behavioral: Negative.     PHYSICAL EXAMINATION:    BP 121/81 (BP Location: Right Arm, Patient Position: Sitting)   Pulse 64   Wt 118 lb 9.6 oz (53.8 kg)   LMP 12/05/2017 (Exact Date)   BMI 19.14 kg/m     General appearance: alert, cooperative and appears stated age  Pelvic: External genitalia:  no lesions or lacerations (the area she tears is on her perineal skin, not vaginal mucosa)              Urethra:  normal appearing urethra with no masses, tenderness or lesions              Bartholins and Skenes: normal                 Vagina: normal appearing vagina with normal color and discharge, no lesions. Just inside the introitus the vagina is tight, able to insert one finger comfortably, less comfortable with 2 fingers.               Cervix: no lesions               Chaperone was present for exam.  ASSESSMENT Contraception counseling Perineal tearing with intercourse, s/p repair of congenital abnormality of the introitus. Able to insert a regular speculum, the vagina is still tight with insertion of 2 fingers    PLAN Return for mirena vs kyleena IUD insertion, information given Recommended she continue with the vaginal dilators She should control the rate and depth of vaginal penetration and use lubrication with intercourse Lidocaine ointment for prn use Can use vaseline externally   An After Visit Summary was printed and given to the patient.  ~15 minutes face to face time of which over 50% was spent in counseling.

## 2017-12-11 ENCOUNTER — Ambulatory Visit: Payer: Commercial Managed Care - PPO | Admitting: Obstetrics and Gynecology

## 2017-12-11 ENCOUNTER — Telehealth: Payer: Self-pay | Admitting: Obstetrics and Gynecology

## 2017-12-11 MED ORDER — NORETHINDRONE 0.35 MG PO TABS
1.0000 | ORAL_TABLET | Freq: Every day | ORAL | 0 refills | Status: DC
Start: 2017-12-11 — End: 2018-01-23

## 2017-12-11 NOTE — Telephone Encounter (Signed)
Returned call to call. Patient requesting to reschedule IUD insertion. States she will get her work schedule on Thursday or Friday and would like to call then to schedule. RN advised office would be closed tomorrow for the holiday, but would be open on Friday. Patient agreeable. Patient also asking for refill of Norethindrone, states she has not taken in a month. RN advised would need to review with Dr. Oscar LaJertson and return call. Patient agreeable.  Routing to provider for review.

## 2017-12-11 NOTE — Telephone Encounter (Signed)
Patient returned call. Message given to patient as seen below from Dr. Oscar LaJertson and patient verbalized understanding of recommendations. Patient will call back later in the week to schedule IUD insertion once she has her work schedule.   Routing to provider for final review. Patient agreeable to disposition. Will close encounter.

## 2017-12-11 NOTE — Telephone Encounter (Signed)
1. Patient called and cancelled her appointment for this afternoon for IUD insertion with Dr. Oscar LaJertson. She just scheduled it late yesterday.  Patient stated her mom had concerns about the appointment today before the long holiday weekend because they will be traveling. Patient does want to have the IUD inserted at a later date.  2. Patient requested refills on Norethindrone to be sent to her pharmacy on file to last until IUD insertion.  Cc: Rosa and Dr. Oscar LaJertson

## 2017-12-11 NOTE — Telephone Encounter (Signed)
Message left to return call to Emily at [phone].    

## 2017-12-11 NOTE — Telephone Encounter (Signed)
One pack of micronor sent to the pharmacy. Just advise her that she needs back up contraception for one week after starting the micronor and should use condoms for STD protection.

## 2017-12-13 ENCOUNTER — Telehealth: Payer: Self-pay | Admitting: Obstetrics and Gynecology

## 2017-12-13 NOTE — Telephone Encounter (Signed)
Patient is ready to schedule her IUD insertion.  °

## 2017-12-13 NOTE — Telephone Encounter (Signed)
Return call to patient. Ready to schedule IUD insertion.  Restarted Micronor.  Not sexually active since LMP.of 12-05-17. Instructed to remain abstinent until procedure. IUD scheduled for 12-17-17 at 1515.  Advised to take Motrin 800 mg 1 hour prior with food.   Routing to provider for final review. Patient agreeable to disposition. Will close encounter.

## 2017-12-16 NOTE — Progress Notes (Signed)
GYNECOLOGY  VISIT   HPI: 22 y.o.   Single  Caucasian  female   G0P0000 with Patient's last menstrual period was 12/05/2017 (exact date).   [facility]    GYNECOLOGIC HISTORY: Patient's last menstrual period was 12/05/2017 (exact date). Contraception:Condoms, POP  Menopausal hormone therapy: None        OB History    Gravida  0   Para  0   Term  0   Preterm  0   AB  0   Living  0     SAB  0   TAB  0   Ectopic  0   Multiple  0   Live Births  0              Patient Active Problem List   Diagnosis Date Noted  . 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 Costochondritis 02/04/2014  . Unspecified asthma(493.90) 04/20/2013  . Cat allergies 04/20/2013  . Seasonal allergies 04/20/2013    Past Medical History:  Diagnosis Date  . Acne    tx with spironolactone  . Allergy    Has allergies to dogs and cats.  . Anxiety    no meds  . Asthma    as a child, rarely uses inhaler  . Cat allergies 04/20/2013  . Depression    no meds  . GERD (gastroesophageal reflux disease)    occasional  . Migraine headache with aura    last one 04/2017, triggers lack of sleep-otc prn    Past Surgical History:  Procedure Laterality Date  . LESION REMOVAL N/A 05/28/2017   Procedure: PLASTICS REPAIR OF INTROIDUS;  Surgeon: Romualdo BolkJertson, Jill Evelyn, MD;  Location: [facility];  Service: Gynecology;  Laterality: N/A;  . WISDOM TOOTH EXTRACTION      Current Outpatient Medications  Medication Sig Dispense Refill  . albuterol (PROVENTIL HFA;VENTOLIN HFA) 108 (90 BASE) MCG/ACT inhaler Inhale 2 puffs into the lungs every 6 (six) hours as needed for wheezing (One for school and one for home). 2 Inhaler 3  . cetirizine (ZYRTEC) 10 MG tablet Take 10 mg by mouth daily as needed (for allergies.).     Marland Kitchen. cyclobenzaprine (FLEXERIL) 5 MG tablet Take 1 tablet (5 mg total) by mouth 3 (three) times daily as needed for muscle spasms. 30 tablet 1  . famotidine (PEPCID AC) 10 MG chewable  tablet Chew 10 mg by mouth daily as needed for heartburn.    . fluticasone (FLONASE) 50 MCG/ACT nasal spray Place 1 spray into both nostrils 2 (two) times daily as needed for allergies or rhinitis.    Marland Kitchen. ibuprofen (ADVIL,MOTRIN) 200 MG tablet 3 tablets orally every 6 hours over the next 48 hours around the clock, then 3 tablets every 6 hours as needed for pain 30 tablet 0  . lidocaine (XYLOCAINE) 5 % ointment Apply 1 application topically 4 (four) times daily as needed. 30 g 0  . Multiple Vitamin (MULTIVITAMIN) capsule Take 1 capsule by mouth daily.    . Naphazoline-Pheniramine (OPCON-A) 0.027-0.315 % SOLN Place 1 drop into both eyes 3 (three) times daily as needed (for irritated/itchy eyes.).    Marland Kitchen. norethindrone (MICRONOR,CAMILA,ERRIN) 0.35 MG tablet Take 1 tablet (0.35 mg total) by mouth daily. 1 Package 0   No current facility-administered medications for this visit.      ALLERGIES: Pollen extract  Family History  Problem Relation Age of Onset  . Hypertension Mother   . Hyperlipidemia Mother   . Scoliosis Mother   .  Lupus Maternal Grandmother   . Osteoporosis Maternal Grandmother   . Heart disease Maternal Grandfather   . Hypertension Maternal Grandfather   . Diabetes Maternal Grandfather   . Emphysema Maternal Grandfather   . Heart attack Maternal Grandfather        occurred < 84 years old  . Hyperlipidemia Maternal Grandfather   . COPD Maternal Grandfather   . Cancer Maternal Grandfather        skin cancer  . Osteoarthritis Paternal Grandmother   . Diabetes Paternal Grandfather   . Heart disease Paternal Grandfather   . Kidney failure Paternal Grandfather        2 transplants due to DM  . Hypertension Paternal Grandfather   . Hyperlipidemia Paternal Grandfather   . Stroke Paternal Grandfather   . Parkinson's disease Paternal Grandfather     Social History   Socioeconomic History  . Marital status: Single    Spouse name: Not on file  . Number of children: Not on file   . Years of education: Not on file  . Highest education level: Not on file  Occupational History  . Not on file  Social Needs  . Financial resource strain: Not on file  . Food insecurity:    Worry: Not on file    Inability: Not on file  . Transportation needs:    Medical: Not on file    Non-medical: Not on file  Tobacco Use  . Smoking status: Never Smoker  . Smokeless tobacco: Never Used  Substance and Sexual Activity  . Alcohol use: No  . Drug use: No  . Sexual activity: Not Currently    Birth control/protection: Condom    Comment: female/female partner  Lifestyle  . Physical activity:    Days per week: Not on file    Minutes per session: Not on file  . Stress: Not on file  Relationships  . Social connections:    Talks on phone: Not on file    Gets together: Not on file    Attends religious service: Not on file    Active member of club or organization: Not on file    Attends meetings of clubs or organizations: Not on file    Relationship status: Not on file  . Intimate partner violence:    Fear of current or ex partner: Not on file    Emotionally abused: Not on file    Physically abused: Not on file    Forced sexual activity: Not on file  Other Topics Concern  . Not on file  Social History Narrative  . Not on file    Review of Systems  Constitutional: Negative.   HENT: Negative.   Eyes: Negative.   Respiratory: Negative.   Cardiovascular: Negative.   Gastrointestinal: Negative.   Genitourinary: Negative.   Musculoskeletal: Negative.   Skin: Negative.   Neurological: Negative.   Endo/Heme/Allergies: Negative.   Psychiatric/Behavioral: Negative.     PHYSICAL EXAMINATION:    LMP 12/05/2017 (Exact Date)     General appearance: alert, cooperative and appears stated age  Pelvic: External genitalia:  no lesions              Urethra:  normal appearing urethra with no masses, tenderness or lesions              Bartholins and Skenes: normal                  Vagina: normal appearing vagina with normal color and discharge, no lesions  Cervix: no lesions                The risks of the kyleena IUD were reviewed with the patient, including infection, abnormal bleeding and uterine perfortion. Consent was signed.  A speculum was placed in the vagina, the cervix was cleansed with betadine. A tenaculum was placed on the cervix, the uterus sounded to 7cm. The cervix was dilated to a 4 hagar dilator  The kyleena IUD was inserted without difficulty. The string were cut to 3-4 cm. The tenaculum was removed. Slight oozing from the tenaculum site was stopped with pressure.   The patient tolerated the procedure well.    Chaperone was present for exam.  ASSESSMENT Contraception    PLAN Kyleena IUD insertion Genprobe Use condoms for STD protection   An After Visit Summary was printed and given to the patient.

## 2017-12-17 ENCOUNTER — Encounter: Payer: Self-pay | Admitting: Obstetrics and Gynecology

## 2017-12-17 ENCOUNTER — Ambulatory Visit (INDEPENDENT_AMBULATORY_CARE_PROVIDER_SITE_OTHER): Payer: Commercial Managed Care - PPO | Admitting: Obstetrics and Gynecology

## 2017-12-17 ENCOUNTER — Other Ambulatory Visit: Payer: Self-pay

## 2017-12-17 VITALS — BP 111/72 | HR 66 | Wt 120.6 lb

## 2017-12-17 DIAGNOSIS — Z01812 Encounter for preprocedural laboratory examination: Secondary | ICD-10-CM | POA: Diagnosis not present

## 2017-12-17 DIAGNOSIS — Z30430 Encounter for insertion of intrauterine contraceptive device: Secondary | ICD-10-CM

## 2017-12-17 DIAGNOSIS — Z789 Other specified health status: Secondary | ICD-10-CM

## 2017-12-17 DIAGNOSIS — IMO0001 Reserved for inherently not codable concepts without codable children: Secondary | ICD-10-CM

## 2017-12-17 DIAGNOSIS — Z113 Encounter for screening for infections with a predominantly sexual mode of transmission: Secondary | ICD-10-CM

## 2017-12-17 LAB — POCT URINE PREGNANCY: PREG TEST UR: NEGATIVE

## 2017-12-17 NOTE — Patient Instructions (Signed)
IUD Post-procedure Instructions . Cramping is common.  You may take Ibuprofen, Aleve, or Tylenol for the cramping.  This should resolve within 24 hours.   . You may have a small amount of spotting.  You should wear a mini pad for the next few days. . You may have intercourse in 24 hours. . You need to call the office if you have any pelvic pain, fever, heavy bleeding, or foul smelling vaginal discharge. . Shower or bathe as normal . Use back up contraception for one week . Use condoms for STD protection  

## 2017-12-18 LAB — GC/CHLAMYDIA PROBE AMP
Chlamydia trachomatis, NAA: NEGATIVE
Neisseria gonorrhoeae by PCR: NEGATIVE

## 2017-12-20 ENCOUNTER — Telehealth: Payer: Self-pay | Admitting: Obstetrics and Gynecology

## 2017-12-20 NOTE — Telephone Encounter (Signed)
Left message to call Stpehen Petitjean at [phone].  

## 2017-12-20 NOTE — Telephone Encounter (Signed)
Patient is having cramps after having an IUD inserted. Stated that it has not been bad enough to have to take ibuprofen. They "go away on their own." Was expecting cramps for the first 24 hours, but isn't sure if it was normal to still be having them. Stated that she has been doing strenuous activity such as lifting weights and kickboxing.

## 2017-12-20 NOTE — Telephone Encounter (Signed)
Spoke with patient. Kyleena IUD placed 12/17/17. Patient reports intermittent pelvic cramping since insertion. Patient goes from "mild" to 8/10, currently "mild" after taking advil 15min ago. Pain is not positional. Denies bleeding, fever/chills, N/V, or vaginal d/c. Patient reports resuming weight lifting and kickboxing a couple of days after IUD placed.   Recommended patient continue to monitor, take motrin/advil prn. Reduce strenuous activity until pain resolves.  If symptoms do not resolve, will need to be seen in office. ER precautions provided for severe pain. Advised Dr. Oscar LaJertson will review, I will return call with any additional recommendations. Patient agreeable.   Routing to provider for final review. Patient is agreeable to disposition. Will close encounter.

## 2018-01-21 NOTE — Progress Notes (Signed)
GYNECOLOGY  VISIT   HPI: 22 y.o.   Single  Caucasian  female   [id number] with Patient's last menstrual period was 01/06/2018 (exact date).   here for IUD check.  She had a [place]Kyleena IUD inserted last month. She had spotting and cramps for a couple of weeks. Currently fine. No deep dyspareunia. The tearing of the perineum with intercourse is improving.    GYNECOLOGIC HISTORY: Patient's last menstrual period was 01/06/2018 (exact date). Contraception:Kyleena IUD inserted 12/17/2017 Menopausal hormone therapy: n/a        OB History    Gravida  0   Para  0   Term  0   Preterm  0   AB  0   Living  0     SAB  0   TAB  0   Ectopic  0   Multiple  0   Live Births  0              Patient Active Problem List   Diagnosis Date Noted  . 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 Costochondritis 02/04/2014  . Unspecified asthma(493.90) 04/20/2013  . Cat allergies 04/20/2013  . Seasonal allergies 04/20/2013    Past Medical History:  Diagnosis Date  . Acne    tx with spironolactone  . Allergy    Has allergies to dogs and cats.  . Anxiety    no meds  . Asthma    as a child, rarely uses inhaler  . Cat allergies 04/20/2013  . Depression    no meds  . GERD (gastroesophageal reflux disease)    occasional  . Migraine headache with aura    last one 04/2017, triggers lack of sleep-otc prn    Past Surgical History:  Procedure Laterality Date  . LESION REMOVAL N/A 05/28/2017   Procedure: PLASTICS REPAIR OF INTROIDUS;  Surgeon: Romualdo BolkJertson, Zeenat Jeanbaptiste Evelyn, MD;  Location: [facility];  Service: Gynecology;  Laterality: N/A;  . WISDOM TOOTH EXTRACTION      Current Outpatient Medications  Medication Sig Dispense Refill  . albuterol (PROVENTIL HFA;VENTOLIN HFA) 108 (90 BASE) MCG/ACT inhaler Inhale 2 puffs into the lungs every 6 (six) hours as needed for wheezing (One for school and one for home). 2 Inhaler 3  . cetirizine (ZYRTEC) 10 MG tablet Take 10 mg by mouth daily as needed (for  allergies.).     Marland Kitchen. cyclobenzaprine (FLEXERIL) 5 MG tablet Take 1 tablet (5 mg total) by mouth 3 (three) times daily as needed for muscle spasms. 30 tablet 1  . famotidine (PEPCID AC) 10 MG chewable tablet Chew 10 mg by mouth daily as needed for heartburn.    Marland Kitchen. ibuprofen (ADVIL,MOTRIN) 200 MG tablet 3 tablets orally every 6 hours over the next 48 hours around the clock, then 3 tablets every 6 hours as needed for pain 30 tablet 0  . Levonorgestrel (KYLEENA IU) by Intrauterine route.    . Multiple Vitamin (MULTIVITAMIN) capsule Take 1 capsule by mouth daily.    . Naphazoline-Pheniramine (OPCON-A) 0.027-0.315 % SOLN Place 1 drop into both eyes 3 (three) times daily as needed (for irritated/itchy eyes.).    Marland Kitchen. lidocaine (XYLOCAINE) 5 % ointment Apply 1 application topically 4 (four) times daily as needed. (Patient not taking: Reported on 01/23/2018) 30 g 0   No current facility-administered medications for this visit.      ALLERGIES: Pollen extract  Family History  Problem Relation Age of Onset  . Hypertension Mother   . Hyperlipidemia Mother   . Scoliosis  Mother   . Lupus Maternal Grandmother   . Osteoporosis Maternal Grandmother   . Heart disease Maternal Grandfather   . Hypertension Maternal Grandfather   . Diabetes Maternal Grandfather   . Emphysema Maternal Grandfather   . Heart attack Maternal Grandfather        occurred < 20 years old  . Hyperlipidemia Maternal Grandfather   . COPD Maternal Grandfather   . Cancer Maternal Grandfather        skin cancer  . Osteoarthritis Paternal Grandmother   . Diabetes Paternal Grandfather   . Heart disease Paternal Grandfather   . Kidney failure Paternal Grandfather        2 transplants due to DM  . Hypertension Paternal Grandfather   . Hyperlipidemia Paternal Grandfather   . Stroke Paternal Grandfather   . Parkinson's disease Paternal Grandfather     Social History   Socioeconomic History  . Marital status: Single    Spouse name: Not  on file  . Number of children: Not on file  . Years of education: Not on file  . Highest education level: Not on file  Occupational History  . Not on file  Social Needs  . Financial resource strain: Not on file  . Food insecurity:    Worry: Not on file    Inability: Not on file  . Transportation needs:    Medical: Not on file    Non-medical: Not on file  Tobacco Use  . Smoking status: Never Smoker  . Smokeless tobacco: Never Used  Substance and Sexual Activity  . Alcohol use: No  . Drug use: No  . Sexual activity: Not Currently    Birth control/protection: IUD    Comment: female/female partner  Lifestyle  . Physical activity:    Days per week: Not on file    Minutes per session: Not on file  . Stress: Not on file  Relationships  . Social connections:    Talks on phone: Not on file    Gets together: Not on file    Attends religious service: Not on file    Active member of club or organization: Not on file    Attends meetings of clubs or organizations: Not on file    Relationship status: Not on file  . Intimate partner violence:    Fear of current or ex partner: Not on file    Emotionally abused: Not on file    Physically abused: Not on file    Forced sexual activity: Not on file  Other Topics Concern  . Not on file  Social History Narrative  . Not on file    Review of Systems  Constitutional:       Craving sweets  Eyes: Negative.   Respiratory: Negative.   Cardiovascular: Negative.   Gastrointestinal: Negative.   Genitourinary:       Abnormal discharge & spotting in the past but none now.  Musculoskeletal: Negative.   Skin: Negative.   Neurological: Negative.   Endo/Heme/Allergies: Negative.   Psychiatric/Behavioral: Negative.     PHYSICAL EXAMINATION:    BP 92/64   Pulse 60   Resp 16   Ht 5\' 5"  (1.651 m)   Wt 123 lb (55.8 kg)   LMP 01/06/2018 (Exact Date)   BMI 20.47 kg/m     General appearance: alert, cooperative and appears stated  age   Pelvic: External genitalia:  no lesions              Urethra:  normal appearing  urethra with no masses, tenderness or lesions              Bartholins and Skenes: normal                 Vagina: normal appearing vagina with normal color and discharge, no lesions              Cervix: no lesions and iud string 2 cm              Bimanual Exam:  Uterus:  normal size, contour, position, consistency, mobility, non-tender              Adnexa: no mass, fullness, tenderness               Chaperone was present for exam.  ASSESSMENT IUD check, doing well    PLAN Return for annual exam in 6 months Call with any concerns   An After Visit Summary was printed and given to the patient.

## 2018-01-23 ENCOUNTER — Ambulatory Visit: Payer: Commercial Managed Care - PPO | Admitting: Obstetrics and Gynecology

## 2018-01-23 ENCOUNTER — Other Ambulatory Visit: Payer: Self-pay

## 2018-01-23 ENCOUNTER — Encounter: Payer: Self-pay | Admitting: Obstetrics and Gynecology

## 2018-01-23 VITALS — BP 92/64 | HR 60 | Resp 16 | Ht 65.0 in | Wt 123.0 lb

## 2018-01-23 DIAGNOSIS — Z30431 Encounter for routine checking of intrauterine contraceptive device: Secondary | ICD-10-CM | POA: Diagnosis not present

## 2018-08-08 ENCOUNTER — Ambulatory Visit: Payer: Commercial Managed Care - PPO | Admitting: Family Medicine

## 2018-08-08 ENCOUNTER — Other Ambulatory Visit: Payer: Self-pay

## 2018-08-08 ENCOUNTER — Encounter: Payer: Self-pay | Admitting: Family Medicine

## 2018-08-08 VITALS — BP 103/69 | HR 71 | Temp 97.5°F | Resp 18 | Ht 65.0 in | Wt 126.4 lb

## 2018-08-08 DIAGNOSIS — G43109 Migraine with aura, not intractable, without status migrainosus: Secondary | ICD-10-CM | POA: Diagnosis not present

## 2018-08-08 DIAGNOSIS — G8929 Other chronic pain: Secondary | ICD-10-CM | POA: Diagnosis not present

## 2018-08-08 DIAGNOSIS — M546 Pain in thoracic spine: Secondary | ICD-10-CM | POA: Diagnosis not present

## 2018-08-08 DIAGNOSIS — J4520 Mild intermittent asthma, uncomplicated: Secondary | ICD-10-CM | POA: Diagnosis not present

## 2018-08-08 MED ORDER — FLUTICASONE PROPIONATE 50 MCG/ACT NA SUSP: 1.0000 | Freq: Two times a day (BID) | NASAL | 6 refills | Status: DC

## 2018-08-08 MED ORDER — CYCLOBENZAPRINE HCL 10 MG PO TABS: 10.0000 mg | ORAL_TABLET | Freq: Three times a day (TID) | ORAL | 0 refills | Status: DC | PRN

## 2018-08-08 MED ORDER — ALBUTEROL SULFATE HFA 108 (90 BASE) MCG/ACT IN AERS: 2.0000 | INHALATION_SPRAY | Freq: Four times a day (QID) | RESPIRATORY_TRACT | 3 refills | Status: DC | PRN

## 2018-08-08 MED ORDER — RIZATRIPTAN BENZOATE 5 MG PO TABS: 5.0000 mg | ORAL_TABLET | ORAL | 3 refills | Status: DC | PRN

## 2018-08-08 NOTE — Progress Notes (Signed)
2/28/20203:44 PM  Judy [name] 1996/04/12, 23 y.o. female [id number]  Chief Complaint  Patient presents with  . Medication Refill    refills needed for inhalers and cyclobenzaprine and would like to go up to 10mg  and flonase   . Migraine    HPI:   Patient is a 23 y.o. female with past medical history significant for chronic back pain, seasonal allergies, intermittent asthma who presents today for routine followup  Continues to have back pain with spams, flexeril helps but requesting higher dose Uses albuterol very sporadically, however last week had to use about 3 times, but has not needed since Today was able to go for a long run without Allergies not well controlled, taking only zyrtec Achy headache, with sensitivity to light and sound, rarely gets auras, feels most triggers are muscle spasms Taking ibuprofen 2-3 times a week Has had migraines for several years Has never been treated for her migraines Has IUD She reports her mother does not do well on imitrex  Fall Risk  08/08/2018 11/08/2017 09/26/2017 08/21/2017 08/15/2017  Falls in the past year? 0 No No No No  Follow up Falls evaluation completed - - - -     Depression screen [facility] 2/9 08/08/2018 11/08/2017 09/26/2017  Decreased Interest 0 0 0  Down, Depressed, Hopeless 0 0 0  PHQ - 2 Score 0 0 0    Allergies  Allergen Reactions  . Pollen Extract Other (See Comments)    Sneezing/itchy eyes/runny nose    Prior to Admission medications   Medication Sig Start Date End Date Taking? Authorizing Provider  albuterol (PROVENTIL HFA;VENTOLIN HFA) 108 (90 BASE) MCG/ACT inhaler Inhale 2 puffs into the lungs every 6 (six) hours as needed for wheezing (One for school and one for home). 03/01/14  Yes Perez-Fiery, Angelique Blonder, MD  cetirizine (ZYRTEC) 10 MG tablet Take 10 mg by mouth daily as needed (for allergies.).    Yes [provider]  cyclobenzaprine (FLEXERIL) 5 MG tablet Take 1 tablet (5 mg total) by mouth 3 (three) times  daily as needed for muscle spasms. 09/26/17  Yes Myles Lipps, MD  famotidine (PEPCID AC) 10 MG chewable tablet Chew 10 mg by mouth daily as needed for heartburn.   Yes [provider]  ibuprofen (ADVIL,MOTRIN) 200 MG tablet 3 tablets orally every 6 hours over the next 48 hours around the clock, then 3 tablets every 6 hours as needed for pain 06/15/17  Yes Haygood, Maris Berger, MD  Naphazoline-Pheniramine (OPCON-A) 0.027-0.315 % SOLN Place 1 drop into both eyes 3 (three) times daily as needed (for irritated/itchy eyes.).   Yes [provider]  Levonorgestrel (KYLEENA IU) by Intrauterine route. 12/17/17   [provider]  Multiple Vitamin (MULTIVITAMIN) capsule Take 1 capsule by mouth daily.    [provider]    Past Medical History:  Diagnosis Date  . Acne    tx with spironolactone  . Allergy    Has allergies to dogs and cats.  . Anxiety    no meds  . Asthma    as a child, rarely uses inhaler  . Cat allergies 04/20/2013  . Depression    no meds  . GERD (gastroesophageal reflux disease)    occasional  . Migraine headache with aura    last one 04/2017, triggers lack of sleep-otc prn    Past Surgical History:  Procedure Laterality Date  . LESION REMOVAL N/A 05/28/2017   Procedure: PLASTICS REPAIR OF INTROIDUS;  Surgeon: Romualdo Bolk,  MD;  Location: [facility];  Service: Gynecology;  Laterality: N/A;  . WISDOM TOOTH EXTRACTION      Social History   Tobacco Use  . Smoking status: Never Smoker  . Smokeless tobacco: Never Used  Substance Use Topics  . Alcohol use: No    Family History  Problem Relation Age of Onset  . Hypertension Mother   . Hyperlipidemia Mother   . Scoliosis Mother   . Lupus Maternal Grandmother   . Osteoporosis Maternal Grandmother   . Heart disease Maternal Grandfather   . Hypertension Maternal Grandfather   . Diabetes Maternal Grandfather   . Emphysema Maternal Grandfather   . Heart attack Maternal Grandfather         occurred < 21 years old  . Hyperlipidemia Maternal Grandfather   . COPD Maternal Grandfather   . Cancer Maternal Grandfather        skin cancer  . Osteoarthritis Paternal Grandmother   . Diabetes Paternal Grandfather   . Heart disease Paternal Grandfather   . Kidney failure Paternal Grandfather        2 transplants due to DM  . Hypertension Paternal Grandfather   . Hyperlipidemia Paternal Grandfather   . Stroke Paternal Grandfather   . Parkinson's disease Paternal Grandfather     ROS Per hpi  OBJECTIVE:  Blood pressure 103/69, pulse 71, temperature (!) 97.5 F (36.4 C), temperature source Oral, resp. rate 18, height 5\' 5"  (1.651 m), weight 126 lb 6.4 oz (57.3 kg), last menstrual period 07/29/2018, SpO2 97 %. Body mass index is 21.03 kg/m.   Physical Exam Vitals signs and nursing note reviewed.  Constitutional:      Appearance: She is well-developed.  HENT:     Head: Normocephalic and atraumatic.     Right Ear: Hearing, tympanic membrane, ear canal and external ear normal.     Left Ear: Hearing, tympanic membrane, ear canal and external ear normal.  Eyes:     Conjunctiva/sclera: Conjunctivae normal.     Pupils: Pupils are equal, round, and reactive to light.  Neck:     Musculoskeletal: Neck supple.  Cardiovascular:     Rate and Rhythm: Normal rate and regular rhythm.     Heart sounds: Normal heart sounds. No murmur. No friction rub. No gallop.   Pulmonary:     Effort: Pulmonary effort is normal.     Breath sounds: Normal breath sounds. No wheezing or rales.  Lymphadenopathy:     Cervical: No cervical adenopathy.  Skin:    General: Skin is warm and dry.  Neurological:     Mental Status: She is alert and oriented to person, place, and time.     ASSESSMENT and PLAN  1. Chronic bilateral thoracic back pain Stable, increasing flexeril as requested  2. Migraine with aura and without status migrainosus, not intractable Uncontrolled. Discussed treatment  options. She would like to do trial of abortive therapy first and see if increasing flexeril helps to decrease frequency and intensity  3. Mild intermittent asthma without complication Controlled. Continue current regime.  - albuterol (PROVENTIL HFA;VENTOLIN HFA) 108 (90 Base) MCG/ACT inhaler; Inhale 2 puffs into the lungs every 6 (six) hours as needed for wheezing (One for school and one for home).  Other orders - cyclobenzaprine (FLEXERIL) 10 MG tablet; Take 1 tablet (10 mg total) by mouth 3 (three) times daily as needed for muscle spasms. - rizatriptan (MAXALT) 5 MG tablet; Take 1 tablet (5 mg total) by mouth as needed for migraine. May repeat  in 2 hours if needed - fluticasone (FLONASE) 50 MCG/ACT nasal spray; Place 1 spray into both nostrils 2 (two) times daily.  Return in about 6 weeks (around 09/19/2018).    Myles Lipps, MD Primary Care at [facility] [address] [place], [place] [address] Ph.  [phone] Fax [phone]

## 2018-08-08 NOTE — Patient Instructions (Signed)
° ° ° °  If you have lab work done today you will be contacted with your lab results within the next 2 weeks.  If you have not heard from us then please contact us. The fastest way to get your results is to register for My Chart. ° ° °IF you received an x-ray today, you will receive an invoice from [facility]. Please contact [facility] at [phone] with questions or concerns regarding your invoice.  ° °IF you received labwork today, you will receive an invoice from [facility]. Please contact [facility] at [phone] with questions or concerns regarding your invoice.  ° °Our billing staff will not be able to assist you with questions regarding bills from these companies. ° °You will be contacted with the lab results as soon as they are available. The fastest way to get your results is to activate your My Chart account. Instructions are located on the last page of this paperwork. If you have not heard from us regarding the results in 2 weeks, please contact this office. °  ° ° ° °

## 2018-08-27 ENCOUNTER — Telehealth: Payer: Self-pay | Admitting: Family Medicine

## 2018-08-27 NOTE — Telephone Encounter (Unsigned)
Copied from CRM [id number]. Topic: Quick Communication - Rx Refill/Question >> Aug 27, 2018  3:51 PM Zada Girt, Lumin L wrote: Medication: rizatriptan (MAXALT) 5 MG tablet   Has the patient contacted their pharmacy? Yes.   (Agent: If no, request that the patient contact the pharmacy for the refill.) (Agent: If yes, when and what did the pharmacy advise?)  Preferred Pharmacy (with phone number or street name): [id number] [place], [place] - [address] [facility] [address] [place] [address] Phone: [phone] Fax: [phone]  Agent: Please be advised that RX refills may take up to 3 business days. We ask that you follow-up with your pharmacy.

## 2018-08-28 NOTE — Telephone Encounter (Signed)
Informed patient there are 3 refills at [facility]

## 2018-09-02 ENCOUNTER — Other Ambulatory Visit: Payer: Self-pay | Admitting: Family Medicine

## 2018-09-02 NOTE — Telephone Encounter (Signed)
Please advise if you want pt to continue  

## 2018-09-03 NOTE — Telephone Encounter (Signed)
Patient is requesting a refill of the following medications: Requested Prescriptions   Pending Prescriptions Disp Refills  . cyclobenzaprine (FLEXERIL) 10 MG tablet [Pharmacy Med Name: CYCLOBENZAPRINE 10 MG TABLET] 90 tablet 0    Sig: TAKE 1 TABLET BY MOUTH THREE TIMES A DAY AS NEEDED FOR MUSCLE SPASMS    Date of patient request: 09/02/2018 Last office visit: 08/08/2018 Date of last refill: 08/08/2018 Last refill amount:90 Follow up time period per chart:

## 2018-09-10 ENCOUNTER — Telehealth: Payer: Self-pay | Admitting: Family Medicine

## 2018-09-10 NOTE — Telephone Encounter (Signed)
Would like to change flexeril to skelaxin and she does have apt 4/10 if you would like to discuss then

## 2018-09-10 NOTE — Telephone Encounter (Signed)
Copied from CRM [id number]. Topic: Quick Communication - Rx Refill/Question >> Sep 10, 2018  3:41 PM Richarda Blade wrote: Medication: beclomethasone (QVAR) 40 MCG/ACT inhaler [[id number]]   The patient is currently taking cyclobenzaprine (FLEXERIL) 10 MG tablet but reports that it makes her sleepy and would like to try Skelaxin to see how that works. Has the patient contacted their pharmacy? No. (Agent: If no, request that the patient contact the pharmacy for the refill.) The patient is reporting having to use her inhaler a lot more and she thinks she needs one with a steroid (the one that is included)    Preferred Pharmacy (with phone number or street name): [id number] - [place], [place] - [address] [phone] (Phone) [phone] (Fax)    Agent: Please be advised that RX refills may take up to 3 business days. We ask that you follow-up with your pharmacy.

## 2018-09-11 ENCOUNTER — Encounter: Payer: Self-pay | Admitting: Family Medicine

## 2018-09-11 MED ORDER — METAXALONE 400 MG PO TABS: 400.0000 mg | ORAL_TABLET | Freq: Three times a day (TID) | ORAL | 3 refills | Status: DC

## 2018-09-11 MED ORDER — BECLOMETHASONE DIPROP HFA 40 MCG/ACT IN AERB
1.0000 | INHALATION_SPRAY | Freq: Two times a day (BID) | RESPIRATORY_TRACT | 3 refills | Status: DC
Start: 2018-09-11 — End: 2018-09-11

## 2018-09-11 MED ORDER — BECLOMETHASONE DIPROP HFA 40 MCG/ACT IN AERB: 1.0000 | INHALATION_SPRAY | Freq: Two times a day (BID) | RESPIRATORY_TRACT | 3 refills | Status: DC

## 2018-09-12 ENCOUNTER — Telehealth: Payer: Self-pay | Admitting: Family Medicine

## 2018-09-12 NOTE — Telephone Encounter (Signed)
Copied from CRM [id number]. Topic: Quick Communication - Rx Refill/Question >> Sep 10, 2018  3:41 PM Judy [name] wrote: Medication: beclomethasone (QVAR) 40 MCG/ACT inhaler [[id number]]   The patient is currently taking cyclobenzaprine (FLEXERIL) 10 MG tablet but reports that it makes her sleepy and would like to try Skelaxin to see how that works. Has the patient contacted their pharmacy? No. (Agent: If no, request that the patient contact the pharmacy for the refill.) The patient is reporting having to use her inhaler [name] lot more and she thinks she needs one with [name] steroid (the one that is included)    Preferred Pharmacy (with phone number or street name): [id number] [place], [place] - [address] [phone] (Phone) [phone] (Fax)    Agent: Please be advised that RX refills may take up to 3 business days. We ask that you follow-up with your pharmacy. >> Sep 12, 2018 11:54 AM Judy [name] wrote: Patient stated that medication was sent to wrong location. [facility] [address] to [facility]  [id number], [place] - [address] [phone] (Phone) [phone] (Fax)

## 2018-09-12 NOTE — Telephone Encounter (Signed)
Copied from CRM [id number]. Topic: Quick Communication - Rx Refill/Question >> Sep 12, 2018 11:55 AM Dalphine Handing A wrote: Medication: beclomethasone (QVAR REDIHALER) 40 MCG/ACT inhaler (Pt stated that she is completely out of this medicine and the pharmacy is not allowing her to get the medicine until 09/29/2018.)  Has the patient contacted their pharmacy? Yes (Agent: If no, request that the patient contact the pharmacy for the refill.) (Agent: If yes, when and what did the pharmacy advise?)Contact PCP  Preferred Pharmacy (with phone number or street name): [id number] [place], [place] - [address] [phone] (Phone) [phone] (Fax)    Agent: Please be advised that RX refills may take up to 3 business days. We ask that you follow-up with your pharmacy.

## 2018-09-12 NOTE — Telephone Encounter (Signed)
Patient's concern/request was addressed yesterday

## 2018-09-19 ENCOUNTER — Telehealth: Payer: Commercial Managed Care - PPO | Admitting: Family Medicine

## 2018-09-22 ENCOUNTER — Telehealth: Payer: Self-pay | Admitting: Family Medicine

## 2018-09-22 NOTE — Telephone Encounter (Signed)
Called pt to set up an appt for patient due to asthma per CRM. The mailbox was full and couldn't leave a VM.

## 2018-09-25 ENCOUNTER — Other Ambulatory Visit: Payer: Self-pay

## 2018-09-25 ENCOUNTER — Telehealth (INDEPENDENT_AMBULATORY_CARE_PROVIDER_SITE_OTHER): Payer: Commercial Managed Care - PPO | Admitting: Family Medicine

## 2018-09-25 DIAGNOSIS — M4120 Other idiopathic scoliosis, site unspecified: Secondary | ICD-10-CM

## 2018-09-25 DIAGNOSIS — J4530 Mild persistent asthma, uncomplicated: Secondary | ICD-10-CM

## 2018-09-25 DIAGNOSIS — J302 Other seasonal allergic rhinitis: Secondary | ICD-10-CM

## 2018-09-25 DIAGNOSIS — G43109 Migraine with aura, not intractable, without status migrainosus: Secondary | ICD-10-CM | POA: Diagnosis not present

## 2018-09-25 MED ORDER — FLUTICASONE PROPIONATE 50 MCG/ACT NA SUSP: 1.0000 | Freq: Two times a day (BID) | NASAL | 6 refills | Status: DC

## 2018-09-25 MED ORDER — NEBULIZER/TUBING/MOUTHPIECE KIT: PACK | 5 refills | Status: DC

## 2018-09-25 MED ORDER — CETIRIZINE HCL 10 MG PO TABS: 10.0000 mg | ORAL_TABLET | Freq: Every day | ORAL | 2 refills | Status: AC | PRN

## 2018-09-25 MED ORDER — NAPROXEN 500 MG PO TBEC: 500.0000 mg | DELAYED_RELEASE_TABLET | Freq: Two times a day (BID) | ORAL | 0 refills | Status: DC

## 2018-09-25 MED ORDER — BECLOMETHASONE DIPROP HFA 80 MCG/ACT IN AERB: 1.0000 | INHALATION_SPRAY | Freq: Two times a day (BID) | RESPIRATORY_TRACT | 2 refills | Status: DC

## 2018-09-25 MED ORDER — RIZATRIPTAN BENZOATE 10 MG PO TABS: 10.0000 mg | ORAL_TABLET | ORAL | 2 refills | Status: DC | PRN

## 2018-09-25 MED ORDER — ALBUTEROL SULFATE (2.5 MG/3ML) 0.083% IN NEBU: 2.5000 mg | INHALATION_SOLUTION | Freq: Four times a day (QID) | RESPIRATORY_TRACT | 1 refills | Status: DC | PRN

## 2018-09-25 MED ORDER — ALBUTEROL SULFATE HFA 108 (90 BASE) MCG/ACT IN AERS: 2.0000 | INHALATION_SPRAY | Freq: Four times a day (QID) | RESPIRATORY_TRACT | 3 refills | Status: DC | PRN

## 2018-09-25 NOTE — Progress Notes (Signed)
Virtual Visit via telephone Note  I connected with patient on 09/25/18 at 241pm by telephone and verified that I am speaking with the correct person using two identifiers. Judy [name] is currently located at home and patient is currently with her during visit. The provider, Myles Lipps, MD is located in their office at time of visit.  I discussed the limitations, risks, security and privacy concerns of performing an evaluation and management service by telephone and the availability of in person appointments. I also discussed with the patient that there may be a patient responsible charge related to this service. The patient expressed understanding and agreed to proceed.    Telephone visit today for having worsening asthma  HPI  Last OV Feb 2020 H/o mild intermittent asthma and seasonal allergies Was doing well until about 2 weeks ago [place] the beach, has had significant issues when running at the beach Had to call EMS as she felt very SOB and not responding to albuterol, has not ran since then Chest tightness, coughing, SOB for following several days Started zyrtec and flonase daily - drainage has started to clear Started qvar 2 puffs BID before 5 days prior to asthma Last albuterol use 2 days ago Requesting for referral to allergy, asthma and rx for nebulizer machine  Having more tension and achiness in her neck Significant trigger for migraines maxalt not helping as much anymore Has known scoliosis  Fall Risk  09/25/2018 08/08/2018 11/08/2017 09/26/2017 08/21/2017  Falls in the past year? 0 0 No No No  Number falls in past yr: 0 - - - -  Injury with Fall? 0 - - - -  Follow up Falls evaluation completed Falls evaluation completed - - -     Depression screen [facility] 2/9 09/25/2018 08/08/2018 11/08/2017  Decreased Interest 0 0 0  Down, Depressed, Hopeless 0 0 0  PHQ - 2 Score 0 0 0    Allergies  Allergen Reactions  . Pollen Extract Other (See Comments)   Sneezing/itchy eyes/runny nose    Prior to Admission medications   Medication Sig Start Date End Date Taking? Authorizing Provider  albuterol (PROVENTIL HFA;VENTOLIN HFA) 108 (90 Base) MCG/ACT inhaler Inhale 2 puffs into the lungs every 6 (six) hours as needed for wheezing (One for school and one for home). 08/08/18  Yes Myles Lipps, MD  beclomethasone (QVAR REDIHALER) 40 MCG/ACT inhaler Inhale 1 puff into the lungs 2 (two) times daily. 09/11/18  Yes Myles Lipps, MD  cetirizine (ZYRTEC) 10 MG tablet Take 10 mg by mouth daily as needed (for allergies.).    Yes [provider]  famotidine (PEPCID AC) 10 MG chewable tablet Chew 10 mg by mouth daily as needed for heartburn.   Yes [provider]  fluticasone (FLONASE) 50 MCG/ACT nasal spray Place 1 spray into both nostrils 2 (two) times daily. 08/08/18  Yes Myles Lipps, MD  ibuprofen (ADVIL,MOTRIN) 200 MG tablet 3 tablets orally every 6 hours over the next 48 hours around the clock, then 3 tablets every 6 hours as needed for pain 06/15/17  Yes Haygood, Maris Berger, MD  metaxalone (SKELAXIN) 400 MG tablet Take 1 tablet (400 mg total) by mouth 3 (three) times daily. 09/11/18  Yes Myles Lipps, MD  Naphazoline-Pheniramine (OPCON-A) 0.027-0.315 % SOLN Place 1 drop into both eyes 3 (three) times daily as needed (for irritated/itchy eyes.).   Yes [provider]  rizatriptan (MAXALT) 5 MG tablet Take 1 tablet (5 mg  total) by mouth as needed for migraine. May repeat in 2 hours if needed 08/08/18  Yes Myles LippsSantiago, Trellis Guirguis M, MD  Levonorgestrel (KYLEENA IU) by Intrauterine route. 12/17/17   [provider]  Multiple Vitamin (MULTIVITAMIN) capsule Take 1 capsule by mouth daily.    [provider]    Past Medical History:  Diagnosis Date  . Acne    tx with spironolactone  . Allergy    Has allergies to dogs and cats.  . Anxiety    no meds  . Asthma    as a child, rarely uses inhaler  . Cat allergies 04/20/2013   . Depression    no meds  . GERD (gastroesophageal reflux disease)    occasional  . Migraine headache with aura    last one 04/2017, triggers lack of sleep-otc prn    Past Surgical History:  Procedure Laterality Date  . LESION REMOVAL N/A 05/28/2017   Procedure: PLASTICS REPAIR OF INTROIDUS;  Surgeon: Romualdo BolkJertson, Jill Evelyn, MD;  Location: [facility];  Service: Gynecology;  Laterality: N/A;  . WISDOM TOOTH EXTRACTION      Social History   Tobacco Use  . Smoking status: Never Smoker  . Smokeless tobacco: Never Used  Substance Use Topics  . Alcohol use: No    Family History  Problem Relation Age of Onset  . Hypertension Mother   . Hyperlipidemia Mother   . Scoliosis Mother   . Lupus Maternal Grandmother   . Osteoporosis Maternal Grandmother   . Heart disease Maternal Grandfather   . Hypertension Maternal Grandfather   . Diabetes Maternal Grandfather   . Emphysema Maternal Grandfather   . Heart attack Maternal Grandfather        occurred < 21 years old  . Hyperlipidemia Maternal Grandfather   . COPD Maternal Grandfather   . Cancer Maternal Grandfather        skin cancer  . Osteoarthritis Paternal Grandmother   . Diabetes Paternal Grandfather   . Heart disease Paternal Grandfather   . Kidney failure Paternal Grandfather        2 transplants due to DM  . Hypertension Paternal Grandfather   . Hyperlipidemia Paternal Grandfather   . Stroke Paternal Grandfather   . Parkinson's disease Paternal Grandfather     ROS  Objective  Vitals as reported by the patient:  There were no vitals filed for this visit.  ASSESSMENT and PLAN  1. Mild persistent asthma without complication Improving/stablizing. Cont with current regime. Discussed use of albuterol prior to exercise. Referral made as requested.  She will call with contact info for DME company near her so that I may fax rx for nebulizer.  - albuterol (VENTOLIN HFA) 108 (90 Base) MCG/ACT inhaler; Inhale 2 puffs into the  lungs every 6 (six) hours as needed for wheezing (One for school and one for home). - Ambulatory referral to Allergy  2. Seasonal allergies - Ambulatory referral to Allergy  3. Migraine with aura and without status migrainosus, not intractable Triggered by neck pain. Increasing maxalt for abortive therapy, adding naproxen.   4. Scoliosis (and kyphoscoliosis), idiopathic - Ambulatory referral to Spine Surgery  Other orders - fluticasone (FLONASE) 50 MCG/ACT nasal spray; Place 1 spray into both nostrils 2 (two) times daily. - beclomethasone (QVAR REDIHALER) 80 MCG/ACT inhaler; Inhale 1 puff into the lungs 2 (two) times daily. - cetirizine (ZYRTEC) 10 MG tablet; Take 1 tablet (10 mg total) by mouth daily as needed (for allergies.). - rizatriptan (MAXALT) 10 MG tablet; Take  1 tablet (10 mg total) by mouth as needed for migraine. May repeat 1/2 tablet ( ) in 2 hours if needed - naproxen (EC NAPROSYN) 500 MG EC tablet; Take 1 tablet (500 mg total) by mouth 2 (two) times daily with a meal.  FOLLOW-UP: 2-3 months   The above assessment and management plan was discussed with the patient. The patient verbalized understanding of and has agreed to the management plan. Patient is aware to call the clinic if symptoms persist or worsen. Patient is aware when to return to the clinic for a follow-up visit. Patient educated on when it is appropriate to go to the emergency department.    I provided 26 minutes of non-face-to-face time during this encounter.  Myles Lipps, MD Primary Care at [facility] [address] [place], [place] [address] Ph.  [phone] Fax [phone]

## 2018-09-25 NOTE — Progress Notes (Signed)
Chief Complaint: X 2 weeks  Asthma   Pt would like a Referral to an asthma specialist  and an MIR of  spine due to headache ect.Marland KitchenMarland Kitchen

## 2018-11-25 ENCOUNTER — Other Ambulatory Visit: Payer: Self-pay | Admitting: Family Medicine

## 2018-11-26 ENCOUNTER — Ambulatory Visit: Payer: Commercial Managed Care - PPO | Admitting: Allergy

## 2018-11-26 ENCOUNTER — Telehealth: Payer: Self-pay | Admitting: Family Medicine

## 2018-11-26 NOTE — Telephone Encounter (Signed)
Please let patient know that imagining for scoliosis tends to be very specific and I will defer to the specialist to order as they deem needed. thanks

## 2018-11-26 NOTE — Telephone Encounter (Signed)
Pt is requesting mri for her spine, did you want referral placed?

## 2018-11-26 NOTE — Telephone Encounter (Signed)
Pt is calling and has an appt with spine/scoliosis center on 12/19/2018. Pt would like to have mri of spine prior to seeing specialist. Pt said scoliosis runs in her family

## 2018-11-27 NOTE — Telephone Encounter (Signed)
Called and advised pt of providers recommendation, she verbalized understanding.

## 2018-12-03 ENCOUNTER — Ambulatory Visit: Payer: Commercial Managed Care - PPO | Admitting: Allergy

## 2018-12-11 ENCOUNTER — Encounter: Payer: Self-pay | Admitting: Allergy

## 2018-12-11 ENCOUNTER — Ambulatory Visit: Payer: Commercial Managed Care - PPO | Admitting: Allergy

## 2018-12-11 ENCOUNTER — Other Ambulatory Visit: Payer: Self-pay

## 2018-12-11 VITALS — HR 63 | Temp 98.9°F | Resp 16 | Ht 65.0 in | Wt 119.0 lb

## 2018-12-11 DIAGNOSIS — H1013 Acute atopic conjunctivitis, bilateral: Secondary | ICD-10-CM | POA: Diagnosis not present

## 2018-12-11 DIAGNOSIS — J4530 Mild persistent asthma, uncomplicated: Secondary | ICD-10-CM | POA: Insufficient documentation

## 2018-12-11 DIAGNOSIS — J3089 Other allergic rhinitis: Secondary | ICD-10-CM | POA: Diagnosis not present

## 2018-12-11 MED ORDER — QVAR REDIHALER 80 MCG/ACT IN AERB: 1.0000 | INHALATION_SPRAY | Freq: Two times a day (BID) | RESPIRATORY_TRACT | 5 refills | Status: DC

## 2018-12-11 MED ORDER — ALBUTEROL SULFATE HFA 108 (90 BASE) MCG/ACT IN AERS: 2.0000 | INHALATION_SPRAY | Freq: Four times a day (QID) | RESPIRATORY_TRACT | 1 refills | Status: DC | PRN

## 2018-12-11 MED ORDER — QVAR REDIHALER 80 MCG/ACT IN AERB: 1.0000 | INHALATION_SPRAY | Freq: Two times a day (BID) | RESPIRATORY_TRACT | 6 refills | Status: DC

## 2018-12-11 MED ORDER — FLUTICASONE PROPIONATE 50 MCG/ACT NA SUSP: 1.0000 | Freq: Two times a day (BID) | NASAL | 6 refills | Status: DC

## 2018-12-11 NOTE — Progress Notes (Signed)
New Patient Note  RE: Judy [name] MRN: [id number] DOB: 07/16/1995 Date of Office Visit: 12/11/2018  Referring provider: Rutherford Guys, MD Primary care provider: Rutherford Guys, MD  Chief Complaint: Allergic Rhinitis  and Asthma  History of Present Illness: I had the pleasure of seeing Judy [name] for initial evaluation at [facility] on 12/11/2018. She is a 23 y.o. female, who is referred here by Rutherford Guys, MD for the evaluation of asthma and allergies.  Asthma: She reports symptoms of chest tightness, shortness of breath, coughing for 10 years. Current medications include albuterol prn and Qvar 80 1 puff BID x 1.5 months which help. She ran out of her inhaler about 1 week ago. She reports not using aerochamber with asthma inhalers. She tried the following inhalers: none. Main asthma triggers are allergies, exercise. In the last month, frequency of asthma symptoms: 1x/week. Frequency of nocturnal symptoms: 0x/month. Frequency of SABA use: 1x/week. Interference with physical activity: yes. Sleep is undisturbed. In the last 12 months, emergency room visits/urgent care visits/doctor office visits or hospitalizations due to asthma: no. In the last 12 months, oral steroids courses: no. Lifetime history of hospitalization for asthma: no. Prior intubations: no. Asthma was diagnosed at age 84 by spirometry. History of pneumonia: no. She was evaluated by allergist in the past. Smoking exposure: no. Up to date with flu vaccine: yes. History of reflux: yes and takes Pepcid as needed.  Rhinitis: She reports symptoms of itchy eyes/nose, hives, itchy throat . Symptoms have been going on for 10 years. The symptoms are present  all year around with worsening in spring. Other triggers include exposure to dogs. Anosmia: no. Headache: no. She has used opcon A eye drops prn, zyrtec, Flonase 1 spray BID with some improvement in symptoms. Sinus infections: no. Previous work up  includes: skin testing was done over 4 years ago which showed multiple positives - cats, dogs, cockroaches per patient. Previous ENT evaluation: no. Last eye exam: within the past year.  Assessment and Plan: Judy [name] is a 23 y.o. female with: Mild persistent asthma without complication Diagnosed with asthma over 10 years ago however the last few months noticed increasing symptoms.  She was restarted on Qvar 80 1 puff twice a day with good benefit.  Using albuterol once a week.  Triggers include allergies and exertion.  Today's spirometry was normal. . Daily controller medication(s): continue Qvar 80 1 puffs twice a day and rinse mouth afterwards.  o May increase to 2 puffs twice a day if needed.  . Prior to physical activity: May use albuterol rescue inhaler 2 puffs 5 to 15 minutes prior to strenuous physical activities. Marland Kitchen Rescue medications: May use albuterol rescue inhaler 2 puffs or nebulizer every 4 to 6 hours as needed for shortness of breath, chest tightness, coughing, and wheezing. Monitor frequency of use.   Other allergic rhinitis Perennial rhinoconjunctivitis symptoms for the past 10 years with worsening in the spring.  Using over-the-counter Opcon-A, Zyrtec and Flonase with good benefit.  Today's skin testing showed: Positive to grass, weed, ragweed, tree, dust mites, cat, dog, mold  Start environmental control measures.  May use over the counter antihistamines such as Zyrtec (cetirizine), Claritin (loratadine), Allegra (fexofenadine), or Xyzal (levocetirizine) daily as needed.  May use Flonase 1 spray in each nostril 1-2 times a day as needed.  May use Opcon A eye drops as needed.   Let us know when ready to start injections.  Had a  detailed discussion with patient/family that clinical history is suggestive of allergic rhinitis, and may benefit from allergy immunotherapy (AIT). Discussed in detail regarding the dosing, schedule, side effects (mild to moderate local allergic  reaction and rarely systemic allergic reactions including anaphylaxis), and benefits (significant improvement in nasal symptoms, seasonal flares of asthma) of immunotherapy with the patient. There is significant time commitment involved with allergy shots, which includes weekly immunotherapy injections for first 9-12 months and then biweekly to monthly injections for 3-5 years. Consent form was signed.   Allergic conjunctivitis of both eyes  See assessment and plan as above for allergic rhinitis  Return in about 3 months (around 03/13/2019).  Meds ordered this encounter  Medications  . DISCONTD: beclomethasone (QVAR REDIHALER) 80 MCG/ACT inhaler    Sig: Inhale 1 puff into the lungs 2 (two) times daily.    Dispense:  10.6 g    Refill:  6  . fluticasone (FLONASE) 50 MCG/ACT nasal spray    Sig: Place 1 spray into both nostrils 2 (two) times daily.    Dispense:  16 g    Refill:  6  . beclomethasone (QVAR REDIHALER) 80 MCG/ACT inhaler    Sig: Inhale 1 puff into the lungs 2 (two) times daily.    Dispense:  10.6 g    Refill:  5  . albuterol (VENTOLIN HFA) 108 (90 Base) MCG/ACT inhaler    Sig: Inhale 2 puffs into the lungs every 6 (six) hours as needed for wheezing (One for school and one for home).    Dispense:  18 g    Refill:  1   Other allergy screening: Food allergy: kiwi causes some perioral pruritus Medication allergy: no Hymenoptera allergy: no Urticaria: no Eczema:no History of recurrent infections suggestive of immunodeficency: no  Diagnostics: Spirometry:  Tracings reviewed. Her effort: Good reproducible efforts. FVC: 4.30L FEV1: 3.54L, 104% predicted FEV1/FVC ratio: 82% Interpretation: Spirometry consistent with normal pattern.  Please see scanned spirometry results for details.  Skin Testing: Environmental allergy panel. Positive test to: grass, weed, ragweed, tree, dust mites, cat, dog, mold. Results discussed with patient/family. Airborne Adult Perc - 12/11/18  1536    Time Antigen Placed  1515    Allergen Manufacturer  Lavella Hammock    Location  Back    Number of Test  59    Panel 1  Select    1. Control-Buffer 50% Glycerol  Negative    2. Control-Histamine 1 mg/ml  2+    3. Albumin saline  Negative    4. [place]  Negative    5. [place]  Negative    6. Johnson  Negative    7. Aurora Blue  Negative    8. Meadow Fescue  Negative    9. Perennial Rye  Negative    10. Sweet Vernal  Negative    11. Timothy  Negative    12. Cocklebur  2+    13. Burweed Marshelder  Negative    14. Ragweed, short  Negative    15. Ragweed, Giant  Negative    16. Plantain,  English  Negative    17. Lamb's Quarters  2+    18. Sheep Sorrell  Negative    19. Rough Pigweed  2+    20. Marsh Elder, Rough  Negative    21. Mugwort, Common  Negative    22. Ash mix  Negative    23. Birch mix  Negative    24. Beech American  Negative    25. Box, Elder  Negative    26. Cedar, red  Negative    27. Cottonwood, Eastern  2+    28. Elm mix  2+    29. Hickory mix  2+    30. Maple mix  Negative    31. Oak, Russian [place] mix  Negative    32. Pecan Pollen  2+    33. Pine mix  2+    34. Sycamore Eastern  Negative    35. Scioto, Black Pollen  3+    36. Alternaria alternata  Negative    37. Cladosporium Herbarum  Negative    38. Aspergillus mix  Negative    39. Penicillium mix  Negative    40. Bipolaris sorokiniana (Helminthosporium)  Negative    41. Drechslera spicifera (Curvularia)  Negative    42. Mucor plumbeus  Negative    43. Fusarium moniliforme  Negative    44. Aureobasidium pullulans (pullulara)  Negative    45. Rhizopus oryzae  Negative    46. Botrytis cinera  Negative    47. Epicoccum nigrum  Negative    48. Phoma betae  Negative    49. Candida Albicans  Negative    50. Trichophyton mentagrophytes  Negative    51. Mite, D Farinae  5,000 AU/ml  3+    52. Mite, D Pteronyssinus  5,000 AU/ml  4+    53. Cat Hair 10,000 BAU/ml  4+    54.  Dog Epithelia  Negative    55.  Mixed Feathers  Negative    56. Horse Epithelia  2+    57. Cockroach, German  Negative    58. Mouse  Negative    59. Tobacco Leaf  Negative     Intradermal - 12/11/18 1537    Time Antigen Placed  1537    Allergen Manufacturer  Lavella Hammock    Location  Arm    Number of Test  10    Intradermal  Select    Control  Negative    Guatemala  Negative    Johnson  2+    7 Grass  Negative    Ragweed mix  3+    Mold 1  Negative    Mold 2  2+    Mold 3  Negative    Mold 4  2+    Dog  3+    Cockroach  Negative       Past Medical History: Patient Active Problem List   Diagnosis Date Noted  . Mild persistent asthma without complication 73/53/2992  . Other allergic rhinitis 12/11/2018  . Allergic conjunctivitis of both eyes 12/11/2018  . 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 Costochondritis 02/04/2014  . Unspecified asthma(493.90) 04/20/2013  . Cat allergies 04/20/2013  . Seasonal allergies 04/20/2013   Past Medical History:  Diagnosis Date  . Acne    tx with spironolactone  . Allergy    Has allergies to dogs and cats.  . Anxiety    no meds  . Asthma    as a child, rarely uses inhaler  . Cat allergies 04/20/2013  . Depression    no meds  . GERD (gastroesophageal reflux disease)    occasional  . Migraine headache with aura    last one 04/2017, triggers lack of sleep-otc prn   Past Surgical History: Past Surgical History:  Procedure Laterality Date  . LESION REMOVAL N/A 05/28/2017   Procedure: PLASTICS REPAIR OF INTROIDUS;  Surgeon: Salvadore Dom, MD;  Location: [facility];  Service: Gynecology;  Laterality: N/A;  .  WISDOM TOOTH EXTRACTION     Medication List:  Current Outpatient Medications  Medication Sig Dispense Refill  . albuterol (PROVENTIL) (2.5 MG/3ML) 0.083% nebulizer solution Take 3 mLs (2.5 mg total) by nebulization every 6 (six) hours as needed for wheezing or shortness of breath. 150 mL 1  . albuterol (VENTOLIN HFA) 108 (90 Base) MCG/ACT inhaler Inhale 2  puffs into the lungs every 6 (six) hours as needed for wheezing (One for school and one for home). 18 g 1  . beclomethasone (QVAR REDIHALER) 80 MCG/ACT inhaler Inhale 1 puff into the lungs 2 (two) times daily. 10.6 g 5  . cetirizine (ZYRTEC) 10 MG tablet Take 1 tablet (10 mg total) by mouth daily as needed (for allergies.). 30 tablet 2  . famotidine (PEPCID AC) 10 MG chewable tablet Chew 10 mg by mouth daily as needed for heartburn.    . fluticasone (FLONASE) 50 MCG/ACT nasal spray Place 1 spray into both nostrils 2 (two) times daily. 16 g 6  . Levonorgestrel (KYLEENA IU) by Intrauterine route.    . metaxalone (SKELAXIN) 400 MG tablet Take 1 tablet (400 mg total) by mouth 3 (three) times daily. 90 tablet 3  . Multiple Vitamin (MULTIVITAMIN) capsule Take 1 capsule by mouth daily.    . Naphazoline-Pheniramine (OPCON-A) 0.027-0.315 % SOLN Place 1 drop into both eyes 3 (three) times daily as needed (for irritated/itchy eyes.).    Marland Kitchen Respiratory Therapy Supplies (NEBULIZER/TUBING/MOUTHPIECE) KIT Use as needed with albuterol solution for asthma symptoms, cough, wheezing, SOB, chest tightness. 1 each 5  . rizatriptan (MAXALT) 10 MG tablet Take 1 tablet (10 mg total) by mouth as needed for migraine. May repeat 1/2 tablet ('5mg'$ ) in 2 hours if needed 10 tablet 2   No current facility-administered medications for this visit.    Allergies: Allergies  Allergen Reactions  . Pollen Extract Other (See Comments)    Sneezing/itchy eyes/runny nose   Social History: Social History   Socioeconomic History  . Marital status: Single    Spouse name: Not on file  . Number of children: Not on file  . Years of education: Not on file  . Highest education level: Not on file  Occupational History  . Not on file  Social Needs  . Financial resource strain: Not on file  . Food insecurity    Worry: Not on file    Inability: Not on file  . Transportation needs    Medical: Not on file    Non-medical: Not on file   Tobacco Use  . Smoking status: Never Smoker  . Smokeless tobacco: Never Used  Substance and Sexual Activity  . Alcohol use: No  . Drug use: No  . Sexual activity: Not Currently    Birth control/protection: I.U.D.    Comment: female/female partner  Lifestyle  . Physical activity    Days per week: Not on file    Minutes per session: Not on file  . Stress: Not on file  Relationships  . Social Herbalist on phone: Not on file    Gets together: Not on file    Attends religious service: Not on file    Active member of club or organization: Not on file    Attends meetings of clubs or organizations: Not on file    Relationship status: Not on file  Other Topics Concern  . Not on file  Social History Narrative  . Not on file   Lives in a house which is 21 years  old. 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 Hypertension Mother   . Hyperlipidemia Mother   . Scoliosis Mother   . Lupus Maternal Grandmother   . Osteoporosis Maternal Grandmother   . Heart disease Maternal Grandfather   . Hypertension Maternal Grandfather   . Diabetes Maternal Grandfather   . Emphysema Maternal Grandfather   . Heart attack Maternal Grandfather        occurred < 29 years old  . Hyperlipidemia Maternal Grandfather   . COPD Maternal Grandfather   . Cancer Maternal Grandfather        skin cancer  . Osteoarthritis Paternal Grandmother   . Diabetes Paternal Grandfather   . Heart disease Paternal Grandfather   . Kidney failure Paternal Grandfather        2 transplants due to DM  . Hypertension Paternal Grandfather   . Hyperlipidemia Paternal Grandfather   . Stroke Paternal Grandfather   . Parkinson's disease Paternal Grandfather    Problem                                Relation Asthma                                   No  Eczema                                Sister  Food allergy                          No  Allergic rhino conjunctivitis     Father   Review of Systems  Constitutional: Negative for appetite change, chills, fever and unexpected weight change.  HENT: Negative for congestion and rhinorrhea.   Eyes: Positive for itching.  Respiratory: Positive for chest tightness. Negative for cough, shortness of breath and wheezing.   Cardiovascular: Negative for chest pain.  Gastrointestinal: Negative for abdominal pain.  Genitourinary: Negative for difficulty urinating.  Skin: Negative for rash.  Allergic/Immunologic: Positive for environmental allergies. Negative for food allergies.  Neurological: Negative for headaches.   Objective: Pulse 63   Temp 98.9 F (37.2 C) (Temporal)   Resp 16   Ht '5\' 5"'$  (1.651 m)   Wt 119 lb (54 kg)   SpO2 98%   BMI 19.80 kg/m  Body mass index is 19.8 kg/m. Physical Exam  Constitutional: She is oriented to person, place, and time. She appears well-developed and well-nourished.  HENT:  Head: Normocephalic and atraumatic.  Right Ear: External ear normal.  Left Ear: External ear normal.  Nose: Nose normal.  Mouth/Throat: Oropharynx is clear and moist.  Eyes: Conjunctivae and EOM are normal.  Neck: Neck supple.  Cardiovascular: Normal rate, regular rhythm and normal heart sounds. Exam reveals no gallop and no friction rub.  No murmur heard. Pulmonary/Chest: Effort normal and breath sounds normal. She has no wheezes. She has no rales.  Abdominal: Soft.  Neurological: She is alert and oriented to person, place, and time.  Skin: Skin is warm. No rash noted.  Psychiatric: She has  a normal mood and affect. Her behavior is normal.  Nursing note and vitals reviewed.  The plan was reviewed with the patient/family, and all questions/concerned were addressed.  It was my pleasure to see Germani today  and participate in her care. Please feel free to contact me with any questions or concerns.  Sincerely,  Rexene Alberts, DO Allergy & Immunology  [facility] office: [phone] [facility] office: [phone]

## 2018-12-11 NOTE — Assessment & Plan Note (Signed)
Perennial rhinoconjunctivitis symptoms for the past 10 years with worsening in the spring.  Using over-the-counter Opcon-A, Zyrtec and Flonase with good benefit.  Today's skin testing showed: Positive to grass, weed, ragweed, tree, dust mites, cat, dog, mold  Start environmental control measures.  May use over the counter antihistamines such as Zyrtec (cetirizine), Claritin (loratadine), Allegra (fexofenadine), or Xyzal (levocetirizine) daily as needed.  May use Flonase 1 spray in each nostril 1-2 times a day as needed.  May use Opcon A eye drops as needed.   Let us know when ready to start injections.  Had a detailed discussion with patient/family that clinical history is suggestive of allergic rhinitis, and may benefit from allergy immunotherapy (AIT). Discussed in detail regarding the dosing, schedule, side effects (mild to moderate local allergic reaction and rarely systemic allergic reactions including anaphylaxis), and benefits (significant improvement in nasal symptoms, seasonal flares of asthma) of immunotherapy with the patient. There is significant time commitment involved with allergy shots, which includes weekly immunotherapy injections for first 9-12 months and then biweekly to monthly injections for 3-5 years. Consent form was signed.

## 2018-12-11 NOTE — Assessment & Plan Note (Signed)
Diagnosed with asthma over 10 years ago however the last few months noticed increasing symptoms.  She was restarted on Qvar 80 1 puff twice a day with good benefit.  Using albuterol once a week.  Triggers include allergies and exertion.  Today's spirometry was normal. . Daily controller medication(s): continue Qvar 80 1 puffs twice a day and rinse mouth afterwards.  o May increase to 2 puffs twice a day if needed.  . Prior to physical activity: May use albuterol rescue inhaler 2 puffs 5 to 15 minutes prior to strenuous physical activities. Marland Kitchen Rescue medications: May use albuterol rescue inhaler 2 puffs or nebulizer every 4 to 6 hours as needed for shortness of breath, chest tightness, coughing, and wheezing. Monitor frequency of use.

## 2018-12-11 NOTE — Assessment & Plan Note (Signed)
   See assessment and plan as above for allergic rhinitis.  

## 2018-12-11 NOTE — Patient Instructions (Addendum)
Today's skin testing showed: Positive to grass, weed, ragweed, tree, dust mites, cat, dog, mold   Start environmental control measures as below.   May use over the counter antihistamines such as Zyrtec (cetirizine), Claritin (loratadine), Allegra (fexofenadine), or Xyzal (levocetirizine) daily as needed.  May use Flonase 1 spray in each nostril 1-2 times a day as needed.  May use Opcon A eye drops as needed.   Let us know when ready to start injections.  Had a detailed discussion with patient/family that clinical history is suggestive of allergic rhinitis, and may benefit from allergy immunotherapy (AIT). Discussed in detail regarding the dosing, schedule, side effects (mild to moderate local allergic reaction and rarely systemic allergic reactions including anaphylaxis), and benefits (significant improvement in nasal symptoms, seasonal flares of asthma) of immunotherapy with the patient. There is significant time commitment involved with allergy shots, which includes weekly immunotherapy injections for first 9-12 months and then biweekly to monthly injections for 3-5 years.   Asthma:  Today's spirometry was normal. . Daily controller medication(s): continue Qvar 80 1 puffs twice a day and rinse mouth afterwards.  o May increase to 2 puffs twice a day if needed.  . Prior to physical activity: May use albuterol rescue inhaler 2 puffs 5 to 15 minutes prior to strenuous physical activities. Marland Kitchen. Rescue medications: May use albuterol rescue inhaler 2 puffs or nebulizer every 4 to 6 hours as needed for shortness of breath, chest tightness, coughing, and wheezing. Monitor frequency of use.  . Asthma control goals:  o Full participation in all desired activities (may need albuterol before activity) o Albuterol use two times or less a week on average (not counting use with activity) o Cough interfering with sleep two times or less a month o Oral steroids no more than once a year o No  hospitalizations  Follow up in 3 months  Reducing Pollen Exposure . Pollen seasons: trees (spring), grass (summer) and ragweed/weeds (fall). Marland Kitchen. Keep windows closed in your home and car to lower pollen exposure.  Lilian Kapur. Install air conditioning in the bedroom and throughout the house if possible.  . Avoid going out in dry windy days - especially early morning. . Pollen counts are highest between 5 - 10 AM and on dry, hot and windy days.  . Save outside activities for late afternoon or after a heavy rain, when pollen levels are lower.  . Avoid mowing of grass if you have grass pollen allergy. Marland Kitchen. Be aware that pollen can also be transported indoors on people and pets.  . Dry your clothes in an automatic dryer rather than hanging them outside where they might collect pollen.  . Rinse hair and eyes before bedtime. Control of House Dust Mite Allergen . Dust mite allergens are a common trigger of allergy and asthma symptoms. While they can be found throughout the house, these microscopic creatures thrive in warm, humid environments such as bedding, upholstered furniture and carpeting. . Because so much time is spent in the bedroom, it is essential to reduce mite levels there.  . Encase pillows, mattresses, and box springs in special allergen-proof fabric covers or airtight, zippered plastic covers.  . Bedding should be washed weekly in hot water (130 F) and dried in a hot dryer. Allergen-proof covers are available for comforters and pillows that can't be regularly washed.  Reyes Ivan. Wash the allergy-proof covers every few months. Minimize clutter in the bedroom. Keep pets out of the bedroom.  Marland Kitchen. Keep humidity less than 50% by using  a dehumidifier or air conditioning. You can buy a humidity measuring device called a hygrometer to monitor this.  . If possible, replace carpets with hardwood, linoleum, or washable area rugs. If that's not possible, vacuum frequently with a vacuum that has a HEPA filter. . Remove all  upholstered furniture and non-washable window drapes from the bedroom. . Remove all non-washable stuffed toys from the bedroom.  Wash stuffed toys weekly. Pet Allergen Avoidance: . Contrary to popular opinion, there are no "hypoallergenic" breeds of dogs or cats. That is because people are not allergic to an animal's hair, but to an allergen found in the animal's saliva, dander (dead skin flakes) or urine. Pet allergy symptoms typically occur within minutes. For some people, symptoms can build up and become most severe 8 to 12 hours after contact with the animal. People with severe allergies can experience reactions in public places if dander has been transported on the pet owners' clothing. Marland Kitchen Keeping an animal outdoors is only a partial solution, since homes with pets in the yard still have higher concentrations of animal allergens. . Before getting a pet, ask your allergist to determine if you are allergic to animals. If your pet is already considered part of your family, try to minimize contact and keep the pet out of the bedroom and other rooms where you spend a great deal of time. . As with dust mites, vacuum carpets often or replace carpet with a hardwood floor, tile or linoleum. . High-efficiency particulate air (HEPA) cleaners can reduce allergen levels over time. . While dander and saliva are the source of cat and dog allergens, urine is the source of allergens from rabbits, hamsters, mice and [place] pigs; so ask a non-allergic family member to clean the animal's cage. . If you have a pet allergy, talk to your allergist about the potential for allergy immunotherapy (allergy shots). This strategy can often provide long-term relief. Mold Control . Mold and fungi can grow on a variety of surfaces provided certain temperature and moisture conditions exist.  . Outdoor molds grow on plants, decaying vegetation and soil. The major outdoor mold, Alternaria and Cladosporium, are found in very high  numbers during hot and dry conditions. Generally, a late summer - fall peak is seen for common outdoor fungal spores. Rain will temporarily lower outdoor mold spore count, but counts rise rapidly when the rainy period ends. . The most important indoor molds are Aspergillus and Penicillium. Dark, humid and poorly ventilated basements are ideal sites for mold growth. The next most common sites of mold growth are the bathroom and the kitchen. Outdoor (Seasonal) Mold Control . Use air conditioning and keep windows closed. . Avoid exposure to decaying vegetation. Marland Kitchen Avoid leaf raking. . Avoid grain handling. . Consider wearing a face mask if working in moldy areas.  Indoor (Perennial) Mold Control  . Maintain humidity below 50%. . Get rid of mold growth on hard surfaces with water, detergent and, if necessary, 5% bleach (do not mix with other cleaners). Then dry the area completely. If mold covers an area more than 10 square feet, consider hiring an indoor environmental professional. . For clothing, washing with soap and water is best. If moldy items cannot be cleaned and dried, throw them away. . Remove sources e.g. contaminated carpets. . Repair and seal leaking roofs or pipes. Using dehumidifiers in damp basements may be helpful, but empty the water and clean units regularly to prevent mildew from forming. All rooms, especially basements, bathrooms and kitchens,  require ventilation and cleaning to deter mold and mildew growth. Avoid carpeting on concrete or damp floors, and storing items in damp areas.

## 2018-12-22 ENCOUNTER — Telehealth: Payer: Self-pay | Admitting: Family Medicine

## 2018-12-22 NOTE — Telephone Encounter (Signed)
Pt has an appointment with PCP to discuss medication refill.

## 2018-12-22 NOTE — Telephone Encounter (Signed)
REFILL rizatriptan (MAXALT) 10 MG tablet  PHARMACY [id number] - [place], [place] [phone] (Phone) [phone] (Fax)

## 2018-12-22 NOTE — Telephone Encounter (Signed)
Patient would like to get labs done when she comes into the office 7/16. Patient is experiencing feeling fatigue , and sleepy. Patient did state she switch her diet to a more vegetarian diet Call back # [phone]

## 2018-12-25 ENCOUNTER — Ambulatory Visit: Payer: Commercial Managed Care - PPO | Admitting: Family Medicine

## 2018-12-25 ENCOUNTER — Encounter: Payer: Self-pay | Admitting: Family Medicine

## 2018-12-25 ENCOUNTER — Other Ambulatory Visit: Payer: Self-pay

## 2018-12-25 VITALS — BP 112/70 | HR 69 | Temp 98.9°F | Ht 65.0 in | Wt 122.8 lb

## 2018-12-25 DIAGNOSIS — H6991 Unspecified Eustachian tube disorder, right ear: Secondary | ICD-10-CM

## 2018-12-25 DIAGNOSIS — J4530 Mild persistent asthma, uncomplicated: Secondary | ICD-10-CM | POA: Diagnosis not present

## 2018-12-25 DIAGNOSIS — R5383 Other fatigue: Secondary | ICD-10-CM

## 2018-12-25 DIAGNOSIS — Z789 Other specified health status: Secondary | ICD-10-CM | POA: Diagnosis not present

## 2018-12-25 DIAGNOSIS — J3089 Other allergic rhinitis: Secondary | ICD-10-CM

## 2018-12-25 MED ORDER — RIZATRIPTAN BENZOATE 5 MG PO TABS: 7.5000 mg | ORAL_TABLET | ORAL | 2 refills | Status: DC | PRN

## 2018-12-25 NOTE — Progress Notes (Signed)
7/16/202011:08 AM  Judy [name] [birth year], 23 y.o., female [id number]  Chief Complaint  Patient presents with  . Asthma    doing alot better, feeling fatigue change eating habbits. Wants nurse visit  for labs  . Ear Problem    having fullness for a couple of wks, finding it hard to ear in the eve time    HPI:   Patient is a 23 y.o. female with past medical history significant for neck pain, asthma,  who presents today for followup  Last OV April 2020 Saw allergy and asthma - no changes in meds Saw scoliosis spine - no scoliosis, neck pain mostly from straight neck, prescribed prednisone, advised strengthening exercise  She is overall doing well Has been fatigued lately vegeterian diet  Having right ear feels clogged at times Cant hear from it, not painful No fever or chills Doing flonase Thinking about doing immunotherapy  Migraines responding well to maxalt 7.5mg  once per migraine Does not need refills at this time  Has stopped running due to asthma Doing interval inside for cardio, doing well Hardly ever uses albuterol Using qvar as prescribed No cough, wheezing or SOB  [facility] 2[facility] 12/25/2018 09/25/2018 08/08/2018  Decreased Interest 0 0 0  Down, Depressed, Hopeless 0 0 0  PHQ - 2 Score 0 0 0    Fall Risk  12/25/2018 09/25/2018 08/08/2018 11/08/2017 09/26/2017  Falls in the past year? 0 0 0 No No  Number falls in past yr: 0 0 - - -  Injury with Fall? 0 0 - - -  Follow up - Falls evaluation completed Falls evaluation completed - -     Allergies  Allergen Reactions  . Pollen Extract Other (See Comments)    Sneezing/itchy eyes/runny nose    Prior to Admission medications   Medication Sig Start Date End Date Taking? Authorizing Provider  albuterol (VENTOLIN HFA) 108 (90 Base) MCG/ACT inhaler Inhale 2 puffs into the lungs every 6 (six) hours as needed for wheezing (One for school and one for home). 12/11/18  Yes Ellamae SiaKim, Yoon M, DO  beclomethasone (QVAR  REDIHALER) 80 MCG/ACT inhaler Inhale 1 puff into the lungs 2 (two) times daily. 12/11/18  Yes Ellamae SiaKim, Yoon M, DO  cetirizine (ZYRTEC) 10 MG tablet Take 1 tablet (10 mg total) by mouth daily as needed (for allergies.). 09/25/18  Yes Myles LippsSantiago, Amneet Cendejas M, MD  famotidine (PEPCID AC) 10 MG chewable tablet Chew 10 mg by mouth daily as needed for heartburn.   Yes [provider]  fluticasone (FLONASE) 50 MCG/ACT nasal spray Place 1 spray into both nostrils 2 (two) times daily. 12/11/18  Yes Ellamae SiaKim, Yoon M, DO  Levonorgestrel (KYLEENA IU) by Intrauterine route. 12/17/17  Yes [provider]  metaxalone (SKELAXIN) 400 MG tablet Take 1 tablet (400 mg total) by mouth 3 (three) times daily. 09/11/18  Yes Myles LippsSantiago, Permelia Bamba M, MD  Multiple Vitamin (MULTIVITAMIN) capsule Take 1 capsule by mouth daily.   Yes [provider]  Naphazoline-Pheniramine (OPCON-A) 0.027-0.315 % SOLN Place 1 drop into both eyes 3 (three) times daily as needed (for irritated/itchy eyes.).   Yes [provider]  rizatriptan (MAXALT) 10 MG tablet Take 1 tablet (10 mg total) by mouth as needed for migraine. May repeat 1/2 tablet (5mg ) in 2 hours if needed 09/25/18  Yes Myles LippsSantiago, Alvin Diffee M, MD  predniSONE (STERAPRED UNI-PAK 21 TAB) 5 MG (21) TBPK tablet TAKE AS DIRECTED ON THE PACK FOR 6 DAYS 12/19/18   [provider]  Past Medical History:  Diagnosis Date  . Acne    tx with spironolactone  . Allergy    Has allergies to dogs and cats.  . Anxiety    no meds  . Asthma    as a child, rarely uses inhaler  . Cat allergies 04/20/2013  . Depression    no meds  . GERD (gastroesophageal reflux disease)    occasional  . Migraine headache with aura    last one 04/2017, triggers lack of sleep-otc prn    Past Surgical History:  Procedure Laterality Date  . LESION REMOVAL N/A 05/28/2017   Procedure: PLASTICS REPAIR OF INTROIDUS;  Surgeon: Salvadore Dom, MD;  Location: [facility];  Service: Gynecology;  Laterality:  N/A;  . WISDOM TOOTH EXTRACTION      Social History   Tobacco Use  . Smoking status: Never Smoker  . Smokeless tobacco: Never Used  Substance Use Topics  . Alcohol use: No    Family History  Problem Relation Age of Onset  . Hypertension Mother   . Hyperlipidemia Mother   . Scoliosis Mother   . Lupus Maternal Grandmother   . Osteoporosis Maternal Grandmother   . Heart disease Maternal Grandfather   . Hypertension Maternal Grandfather   . Diabetes Maternal Grandfather   . Emphysema Maternal Grandfather   . Heart attack Maternal Grandfather        occurred < 19 years old  . Hyperlipidemia Maternal Grandfather   . COPD Maternal Grandfather   . Cancer Maternal Grandfather        skin cancer  . Osteoarthritis Paternal Grandmother   . Diabetes Paternal Grandfather   . Heart disease Paternal Grandfather   . Kidney failure Paternal Grandfather        2 transplants due to DM  . Hypertension Paternal Grandfather   . Hyperlipidemia Paternal Grandfather   . Stroke Paternal Grandfather   . Parkinson's disease Paternal Grandfather     ROS Per hpi  OBJECTIVE:  WEXHB'Z JIRCVE   12/25/18 1034  BP: 112/70  Pulse: 69  Temp: 98.9 F (37.2 C)  TempSrc: Oral  SpO2: 96%  Weight: 122 lb 12.8 oz (55.7 kg)  Height: 5\' 5"  (1.651 m)   Body mass index is 20.43 kg/m.   Physical Exam Vitals signs and nursing note reviewed.  Constitutional:      Appearance: She is well-developed.  HENT:     Head: Normocephalic and atraumatic.     Right Ear: Hearing, tympanic membrane, ear canal and external ear normal.     Left Ear: Hearing, tympanic membrane, ear canal and external ear normal.  Eyes:     Conjunctiva/sclera: Conjunctivae normal.     Pupils: Pupils are equal, round, and reactive to light.  Neck:     Musculoskeletal: Neck supple.  Cardiovascular:     Rate and Rhythm: Normal rate and regular rhythm.     Heart sounds: Normal heart sounds. No murmur. No friction rub. No gallop.    Pulmonary:     Effort: Pulmonary effort is normal.     Breath sounds: Normal breath sounds. No wheezing or rales.  Lymphadenopathy:     Cervical: No cervical adenopathy.  Skin:    General: Skin is warm and dry.  Neurological:     Mental Status: She is alert and oriented to person, place, and time.     ASSESSMENT and PLAN  1. Fatigue, unspecified type - CBC; Future - Vitamin B12; Future - Iron, TIBC and Ferritin Panel;  Future  2. Vegetarian diet Discussed starting Prenatal MVI - CBC; Future - Vitamin B12; Future - Iron, TIBC and Ferritin Panel; Future  3. Eustachian tube disorder, right 4. Mild persistent asthma without complication 5. Other allergic rhinitis Overall doing well. Planning on starting allergy injections. Continue current regime for now.   Other orders - predniSONE (STERAPRED UNI-PAK 21 TAB) 5 MG (21) TBPK tablet; TAKE AS DIRECTED ON THE PACK FOR 6 DAYS - rizatriptan (MAXALT) 5 MG tablet; Take 1.5 tablets (7.5 mg total) by mouth as needed for migraine. May repeat 1/2 tablet (5mg ) in 2 hours if needed  Return in about 6 months (around 06/27/2019).    Myles LippsIrma M Santiago, MD [facility] [address] [place], [place] [address] Ph.  [phone] Fax [phone]

## 2018-12-25 NOTE — Patient Instructions (Signed)
° ° ° °  If you have lab work done today you will be contacted with your lab results within the next 2 weeks.  If you have not heard from us then please contact us. The fastest way to get your results is to register for My Chart. ° ° °IF you received an x-ray today, you will receive an invoice from [facility]. Please contact [facility] at [phone] with questions or concerns regarding your invoice.  ° °IF you received labwork today, you will receive an invoice from [facility]. Please contact [facility] at [phone] with questions or concerns regarding your invoice.  ° °Our billing staff will not be able to assist you with questions regarding bills from these companies. ° °You will be contacted with the lab results as soon as they are available. The fastest way to get your results is to activate your My Chart account. Instructions are located on the last page of this paperwork. If you have not heard from us regarding the results in 2 weeks, please contact this office. °  ° ° ° °

## 2019-01-07 ENCOUNTER — Ambulatory Visit (INDEPENDENT_AMBULATORY_CARE_PROVIDER_SITE_OTHER): Payer: Commercial Managed Care - PPO | Admitting: Family Medicine

## 2019-01-07 DIAGNOSIS — Z789 Other specified health status: Secondary | ICD-10-CM

## 2019-01-07 DIAGNOSIS — R5383 Other fatigue: Secondary | ICD-10-CM

## 2019-01-08 LAB — CBC
Hematocrit: 41.4 % (ref 34.0–46.6)
Hemoglobin: 14 g/dL (ref 11.1–15.9)
MCH: 32 pg (ref 26.6–33.0)
MCHC: 33.8 g/dL (ref 31.5–35.7)
MCV: 95 fL (ref 79–97)
Platelets: 282 10*3/uL (ref 150–450)
RBC: 4.37 x10E6/uL (ref 3.77–5.28)
RDW: 11.2 % — ABNORMAL LOW (ref 11.7–15.4)
WBC: 5.8 10*3/uL (ref 3.4–10.8)

## 2019-01-08 LAB — IRON,TIBC AND FERRITIN PANEL
Ferritin: 34 ng/mL (ref 15–150)
Iron Saturation: 50 % (ref 15–55)
Iron: 151 ug/dL (ref 27–159)
Total Iron Binding Capacity: 304 ug/dL (ref 250–450)
UIBC: 153 ug/dL (ref 131–425)

## 2019-01-08 LAB — VITAMIN B12: Vitamin B-12: 199 pg/mL — ABNORMAL LOW (ref 232–1245)

## 2019-01-13 ENCOUNTER — Telehealth: Payer: Self-pay | Admitting: Family Medicine

## 2019-01-13 NOTE — Telephone Encounter (Signed)
Copied from [place] [id number]. Topic: General - Inquiry >> Jan 13, 2019 11:42 AM Judy [name], NT wrote: Reason for CRM: Patient called in stating she is wanting her lab results as well as any advice from PCP. No note stating triage can handle. Please advise and call back [phone].

## 2019-01-13 NOTE — Telephone Encounter (Signed)
Attempted to call pt no answer and unable to leave message since mailbox is full.  Plan: Relay the lab results from the provider.   "Please let patient that while she does not have anemia and her iron levels are normal, her vitamin b12 is low.  I advise she start an over the counter b12 supplement of 2000 mcg daily for 2 weeks and then 1065mcg daily long term.  Thanks"

## 2019-01-14 ENCOUNTER — Telehealth: Payer: Self-pay | Admitting: Family Medicine

## 2019-01-14 NOTE — Progress Notes (Signed)
Pt understands that she is to take supplements of b12, 2wks 2000 mcg after which 1000 mcg long term.

## 2019-01-14 NOTE — Telephone Encounter (Signed)
Copied from [place] [phone]. Topic: General - Inquiry >> Jan 13, 2019 11:42 AM Richardo Priest, NT wrote: Reason for CRM: Patient called in stating she is wanting her lab results as well as any advice from PCP. No note stating triage can handle. Please advise and call back [phone]. >> Jan 14, 2019  9:37 AM Yvette Rack wrote: Pt called in for lab results. Pt requests call back.

## 2019-01-14 NOTE — Progress Notes (Signed)
Pt understands that she is to take supplements of b12.

## 2019-01-14 NOTE — Telephone Encounter (Signed)
Please mail her a letter. thanks

## 2019-01-20 ENCOUNTER — Telehealth: Payer: Self-pay | Admitting: Family Medicine

## 2019-01-20 NOTE — Telephone Encounter (Signed)
Pt saw results on MyChart and wanted to know if a B12 shot was an option rather than taking pills.

## 2019-01-20 NOTE — Telephone Encounter (Signed)
Pt called requesting lab results. Currently setting up MyChart and would like results uploaded to that and a CB

## 2019-01-21 NOTE — Telephone Encounter (Signed)
As she is not deficient, insurance will not pay. Also we reserve injections for people whom do not absorb b12, ie  have pernicious anemia, which is not her case. thanks

## 2019-01-21 NOTE — Telephone Encounter (Signed)
Wants to know if a B12 shot was an option rather than taking pills.

## 2019-01-22 NOTE — Telephone Encounter (Signed)
Left detailed message for pt 

## 2019-01-23 ENCOUNTER — Other Ambulatory Visit: Payer: Self-pay | Admitting: Family Medicine

## 2019-02-12 ENCOUNTER — Other Ambulatory Visit: Payer: Self-pay | Admitting: Obstetrics and Gynecology

## 2019-02-12 ENCOUNTER — Other Ambulatory Visit: Payer: Self-pay

## 2019-02-12 ENCOUNTER — Telehealth: Payer: Self-pay | Admitting: Obstetrics and Gynecology

## 2019-02-12 DIAGNOSIS — R102 Pelvic and perineal pain: Secondary | ICD-10-CM

## 2019-02-12 DIAGNOSIS — Z30431 Encounter for routine checking of intrauterine contraceptive device: Secondary | ICD-10-CM

## 2019-02-12 NOTE — Telephone Encounter (Signed)
Patient is wondering if her has "fell out of place"?

## 2019-02-12 NOTE — Telephone Encounter (Signed)
Patient would like to cancel her pus that was scheduled today. She states that "the pain is not that bad right now". She has an aex scheduled next week and would like to "just self evaluate and follow up at her appointment next week". I informed her a nurse will need to call her back. (I did not cancel the PUS appointment today).

## 2019-02-12 NOTE — Telephone Encounter (Signed)
Spoke with patient. Patient is requesting to have her IUD checked. Kyleena IUD placed 12/17/17. Reports pain during and after intercourse. Pelvic cramping and aching 5/10 currently. LMP 02/04/19, menses lasted 2 days longer than previous cycles and was a little heavier. Denies fever/chills, N/V. Recommended OV with PUS for further evaluation, scheduled for today at 3pm, consult at 3:30pm with Dr. Talbert Nan. Advised patient I will review with Dr. Talbert Nan and return call if any additional recommendations. Patient request to review benefits of PUS prior to appt, order placed. Patient agreeable.   Dr. Talbert Nan -ok to proceed as scheduled?

## 2019-02-12 NOTE — Telephone Encounter (Signed)
Spoke with patient. Patient wants to cancel PUS and consult. Patient states she has experienced this same pain in the past, just did taekwondo with no problem. Patient states she has a AEX scheduled for 02/20/19, will f/u at that time. Offered OV today with Dr. Talbert Nan, patient declined. PUS and consult cancelled. Advised to return call to office if symptoms do not resolve or new symptoms develop, ER precautions for new or worsening symptoms.   Routing to provider for final review. Patient is agreeable to disposition. Will close encounter.

## 2019-02-12 NOTE — Telephone Encounter (Signed)
Left message to call Loretto Belinsky, RN at [facility] [phone].   

## 2019-02-18 ENCOUNTER — Other Ambulatory Visit: Payer: Self-pay

## 2019-02-18 ENCOUNTER — Telehealth: Payer: Self-pay | Admitting: Obstetrics and Gynecology

## 2019-02-18 NOTE — Telephone Encounter (Signed)
Call placed to review benefit and schedule recommended ultrasound. Left voicemail message requesting a return call °

## 2019-02-19 NOTE — Telephone Encounter (Signed)
Spoke with patient. Patient states pain has improved, not completely resolved. Reports intermittent mild menses like cramps. Patient wants to discuss having Kyleena IUD removed to see if cramping resolves. Denies fever/chills, N/V, vaginal d/c or odor. See 02/12/19 telephone encounter. Patient declines to schedule PUS at this time. Patient is scheduled for AEX on 02/20/19 with Dr. Talbert Nan, will further discuss options at that time.    Routing to provider for final review. Patient is agreeable to disposition. Will close encounter.  Cc: Lerry Liner

## 2019-02-19 NOTE — Progress Notes (Addendum)
23 y.o. G0P0000 Single White or Caucasian Hispanic or Latino female here for annual exam.  She has a [place] IUD, inserted in 7/19. Cramps are worse with the [facility], she can have some cramping 2-3 x a month randomly. The cramps can last for a day, up to a 5/10 in severity.  No medication. With her cycle her cramps are 8/10 in severity.  Period Cycle (Days): 28 Period Duration (Days): 5 Period Pattern: Regular Menstrual Flow: Light, Moderate Menstrual Control: Thin pad, Tampon Menstrual Control Change Freq (Hours): 4-6 Dysmenorrhea: (!) Severe Dysmenorrhea Symptoms: Cramping(Emotional)  Patient's last menstrual period was 02/01/2019.     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 She was having trouble with pain with intercourse (and tearing). She has used vaginal dilators and has lidocaine ointment.  She is completely better.   She is sexually active, same partner x 1 year, not using condoms, no pain!       Sexually active: Yes.    The current method of family planning is [place].    Exercising: Yes.    weight lifting, yoga, and interval training Smoker:  no  Health Maintenance: Pap:  Never due to age History of abnormal Pap:  no TDaP:  12/30/2008 Gardasil: no   reports that she has never smoked. She has never used smokeless tobacco. She reports that she does not drink alcohol or use drugs. She has 1.5 years left at [employer], majoring in kinesiology . Living at home.   Past Medical History:  Diagnosis Date  . Acne    tx with spironolactone  . Allergy    Has allergies to dogs and cats.  . Anxiety    no meds  . Asthma    as a child, rarely uses inhaler  . Cat allergies 04/20/2013  . Depression    no meds  . GERD (gastroesophageal reflux disease)    occasional  . Migraine headache with aura    last one 04/2017, triggers lack of sleep-otc prn    Past Surgical History:  Procedure Laterality Date  . LESION REMOVAL N/A 05/28/2017   Procedure: PLASTICS  REPAIR OF INTROIDUS;  Surgeon: Salvadore Dom, MD;  Location: [facility];  Service: Gynecology;  Laterality: N/A;  . WISDOM TOOTH EXTRACTION      Current Outpatient Medications  Medication Sig Dispense Refill  . albuterol (VENTOLIN HFA) 108 (90 Base) MCG/ACT inhaler Inhale 2 puffs into the lungs every 6 (six) hours as needed for wheezing (One for school and one for home). 18 g 1  . cetirizine (ZYRTEC) 10 MG tablet Take 1 tablet (10 mg total) by mouth daily as needed (for allergies.). 30 tablet 2  . famotidine (PEPCID AC) 10 MG chewable tablet Chew 10 mg by mouth daily as needed for heartburn.    . fluticasone (FLONASE) 50 MCG/ACT nasal spray Place 1 spray into both nostrils 2 (two) times daily. 16 g 6  . Levonorgestrel (KYLEENA IU) by Intrauterine route.    . metaxalone (SKELAXIN) 400 MG tablet Take 1 tablet (400 mg total) by mouth 3 (three) times daily. 90 tablet 3  . Naphazoline-Pheniramine (OPCON-A) 0.027-0.315 % SOLN Place 1 drop into both eyes 3 (three) times daily as needed (for irritated/itchy eyes.).    Marland Kitchen rizatriptan (MAXALT) 5 MG tablet Take 1.5 tablets (7.5 mg total) by mouth as needed for migraine. May repeat 1/2 tablet (5mg ) in 2 hours if needed 20 tablet 2   No current facility-administered medications for this visit.  Family History  Problem Relation Age of Onset  . Hypertension Mother   . Hyperlipidemia Mother   . Scoliosis Mother   . Lupus Maternal Grandmother   . Osteoporosis Maternal Grandmother   . Heart disease Maternal Grandfather   . Hypertension Maternal Grandfather   . Diabetes Maternal Grandfather   . Emphysema Maternal Grandfather   . Heart attack Maternal Grandfather        occurred < 3 years old  . Hyperlipidemia Maternal Grandfather   . COPD Maternal Grandfather   . Cancer Maternal Grandfather        skin cancer  . Osteoarthritis Paternal Grandmother   . Diabetes Paternal Grandfather   . Heart disease Paternal Grandfather   . Kidney failure  Paternal Grandfather        2 transplants due to DM  . Hypertension Paternal Grandfather   . Hyperlipidemia Paternal Grandfather   . Stroke Paternal Grandfather   . Parkinson's disease Paternal Grandfather     Review of Systems  Constitutional: Negative.   HENT: Negative.   Eyes: Negative.   Respiratory: Negative.   Cardiovascular: Negative.   Gastrointestinal: Negative.   Endocrine: Negative.   Genitourinary: Negative.   Musculoskeletal: Negative.   Skin: Negative.   Allergic/Immunologic: Negative.   Neurological: Negative.   Hematological: Negative.   Psychiatric/Behavioral: Negative.     Exam:   BP 108/64 (BP Location: Right Arm, Patient Position: Sitting, Cuff Size: Normal)   Pulse 76   Temp (!) 97.5 F (36.4 C) (Temporal)   Resp 12   Ht 5' 5.75" (1.67 m)   Wt 122 lb (55.3 kg)   LMP 02/01/2019   BMI 19.84 kg/m   Weight change: @WEIGHTCHANGE @ Height:   Height: 5' 5.75" (167 cm)  Ht Readings from Last 3 Encounters:  02/20/19 5' 5.75" (1.67 m)  12/25/18 5\' 5"  (1.651 m)  12/11/18 5\' 5"  (1.651 m)    General appearance: alert, cooperative and appears stated age Head: Normocephalic, without obvious abnormality, atraumatic Neck: no adenopathy, supple, symmetrical, trachea midline and thyroid normal to inspection and palpation Lungs: clear to auscultation bilaterally Cardiovascular: regular rate and rhythm Breasts: normal appearance, no masses or tenderness Abdomen: soft, non-tender; non distended,  no masses,  no organomegaly Extremities: extremities normal, atraumatic, no cyanosis or edema Skin: Skin color, texture, turgor normal. No rashes or lesions Lymph nodes: Cervical, supraclavicular, and axillary nodes normal. No abnormal inguinal nodes palpated Neurologic: Grossly normal   Pelvic: External genitalia:  no lesions              Urethra:  normal appearing urethra with no masses, tenderness or lesions              Bartholins and Skenes: normal                  Vagina: normal appearing vagina with normal color and discharge, no lesions              Cervix: no lesions and IUD string 3 cm               Bimanual Exam:  Uterus:  normal size, contour, position, consistency, mobility, non-tender              Adnexa: no mass, fullness, tenderness               Rectovaginal: Confirms               Anus:  normal sphincter tone, no lesions  Chaperone was  present for exam.  A:  Well Woman with normal exam  Kyleena IUD   Worsening cramps with the IUD  P:   Pap with GC/CT/trich   STD testing  Screening labs  Desires removal of kyleena and insertion of nexplanon, risks and side effects reviewed  Condoms discussed  Discussed breast self exam  Discussed calcium and vit D intake    Addendum: a slight increase in watery, white vaginal d/c was noted. Patient denied symptoms.

## 2019-02-19 NOTE — Telephone Encounter (Signed)
Spoke with patient in regards to benefit for recommended ultrasound. Patients states she is not currently having pain and is feeling better. Patient is asking is ultrasound is still needed.   Forwarding to Triage Nurse

## 2019-02-20 ENCOUNTER — Telehealth: Payer: Self-pay | Admitting: Obstetrics and Gynecology

## 2019-02-20 ENCOUNTER — Encounter: Payer: Self-pay | Admitting: Obstetrics and Gynecology

## 2019-02-20 ENCOUNTER — Other Ambulatory Visit (HOSPITAL_COMMUNITY)
Admission: RE | Admit: 2019-02-20 | Discharge: 2019-02-20 | Disposition: A | Discharge status: Home / Self Care | Payer: Commercial Managed Care - PPO | Source: Ambulatory Visit | Attending: Obstetrics and Gynecology | Admitting: Obstetrics and Gynecology

## 2019-02-20 ENCOUNTER — Other Ambulatory Visit: Payer: Self-pay

## 2019-02-20 ENCOUNTER — Ambulatory Visit: Payer: Commercial Managed Care - PPO | Admitting: Obstetrics and Gynecology

## 2019-02-20 VITALS — BP 108/64 | HR 76 | Temp 97.5°F | Resp 12 | Ht 65.75 in | Wt 122.0 lb

## 2019-02-20 DIAGNOSIS — Z113 Encounter for screening for infections with a predominantly sexual mode of transmission: Secondary | ICD-10-CM | POA: Diagnosis not present

## 2019-02-20 DIAGNOSIS — Z124 Encounter for screening for malignant neoplasm of cervix: Secondary | ICD-10-CM | POA: Insufficient documentation

## 2019-02-20 DIAGNOSIS — Z0000 Encounter for general adult medical examination without abnormal findings: Secondary | ICD-10-CM | POA: Diagnosis not present

## 2019-02-20 DIAGNOSIS — Z01419 Encounter for gynecological examination (general) (routine) without abnormal findings: Secondary | ICD-10-CM

## 2019-02-20 DIAGNOSIS — Z3009 Encounter for other general counseling and advice on contraception: Secondary | ICD-10-CM

## 2019-02-20 NOTE — Patient Instructions (Signed)
EXERCISE AND DIET:  We recommended that you start or continue a regular exercise program for good health. Regular exercise means any activity that makes your heart beat faster and makes you sweat.  We recommend exercising at least 30 minutes per day at least 3 days a week, preferably 4 or 5.  We also recommend a diet low in fat and sugar.  Inactivity, poor dietary choices and obesity can cause diabetes, heart attack, stroke, and kidney damage, among others.    ALCOHOL AND SMOKING:  Women should limit their alcohol intake to no more than 7 drinks/beers/glasses of wine (combined, not each!) per week. Moderation of alcohol intake to this level decreases your risk of breast cancer and liver damage. And of course, no recreational drugs are part of a healthy lifestyle.  And absolutely no smoking or even second hand smoke. Most people know smoking can cause heart and lung diseases, but did you know it also contributes to weakening of your bones? Aging of your skin?  Yellowing of your teeth and nails?  CALCIUM AND VITAMIN D:  Adequate intake of calcium and Vitamin D are recommended.  The recommendations for exact amounts of these supplements seem to change often, but generally speaking 1,000 mg of calcium (between diet and supplement) and 800 units of Vitamin D per day seems prudent. Certain women may benefit from higher intake of Vitamin D.  If you are among these women, your doctor will have told you during your visit.    PAP SMEARS:  Pap smears, to check for cervical cancer or precancers,  have traditionally been done yearly, although recent scientific advances have shown that most women can have pap smears less often.  However, every woman still should have a physical exam from her gynecologist every year. It will include a breast check, inspection of the vulva and vagina to check for abnormal growths or skin changes, a visual exam of the cervix, and then an exam to evaluate the size and shape of the uterus and  ovaries.  And after 21 years of age, a rectal exam is indicated to check for rectal cancers. We will also provide age appropriate advice regarding health maintenance, like when you should have certain vaccines, screening for sexually transmitted diseases, bone density testing, colonoscopy, mammograms, etc.   MAMMOGRAMS:  All women over 40 years old should have a yearly mammogram. Many facilities now offer a "3D" mammogram, which may cost around $50 extra out of pocket. If possible,  we recommend you accept the option to have the 3D mammogram performed.  It both reduces the number of women who will be called back for extra views which then turn out to be normal, and it is better than the routine mammogram at detecting truly abnormal areas.    COLON CANCER SCREENING: Now recommend starting at age 45. At this time colonoscopy is not covered for routine screening until 50. There are take home tests that can be done between 45-49.   COLONOSCOPY:  Colonoscopy to screen for colon cancer is recommended for all women at age 50.  We know, you hate the idea of the prep.  We agree, BUT, having colon cancer and not knowing it is worse!!  Colon cancer so often starts as a polyp that can be seen and removed at colonscopy, which can quite literally save your life!  And if your first colonoscopy is normal and you have no family history of colon cancer, most women don't have to have it again for   10 years.  Once every ten years, you can do something that may end up saving your life, right?  We will be happy to help you get it scheduled when you are ready.  Be sure to check your insurance coverage so you understand how much it will cost.  It may be covered as a preventative service at no cost, but you should check your particular policy.      Breast Self-Awareness Breast self-awareness means being familiar with how your breasts look and feel. It involves checking your breasts regularly and reporting any changes to your  health care provider. Practicing breast self-awareness is important. A change in your breasts can be a sign of a serious medical problem. Being familiar with how your breasts look and feel allows you to find any problems early, when treatment is more likely to be successful. All women should practice breast self-awareness, including women who have had breast implants. How to do a breast self-exam One way to learn what is normal for your breasts and whether your breasts are changing is to do a breast self-exam. To do a breast self-exam: Look for Changes  1. Remove all the clothing above your waist. 2. Stand in front of a mirror in a room with good lighting. 3. Put your hands on your hips. 4. Push your hands firmly downward. 5. Compare your breasts in the mirror. Look for differences between them (asymmetry), such as: ? Differences in shape. ? Differences in size. ? Puckers, dips, and bumps in one breast and not the other. 6. Look at each breast for changes in your skin, such as: ? Redness. ? Scaly areas. 7. Look for changes in your nipples, such as: ? Discharge. ? Bleeding. ? Dimpling. ? Redness. ? A change in position. Feel for Changes Carefully feel your breasts for lumps and changes. It is best to do this while lying on your back on the floor and again while sitting or standing in the shower or tub with soapy water on your skin. Feel each breast in the following way:  Place the arm on the side of the breast you are examining above your head.  Feel your breast with the other hand.  Start in the nipple area and make  inch (2 cm) overlapping circles to feel your breast. Use the pads of your three middle fingers to do this. Apply light pressure, then medium pressure, then firm pressure. The light pressure will allow you to feel the tissue closest to the skin. The medium pressure will allow you to feel the tissue that is a little deeper. The firm pressure will allow you to feel the tissue  close to the ribs.  Continue the overlapping circles, moving downward over the breast until you feel your ribs below your breast.  Move one finger-width toward the center of the body. Continue to use the  inch (2 cm) overlapping circles to feel your breast as you move slowly up toward your collarbone.  Continue the up and down exam using all three pressures until you reach your armpit.  Write Down What You Find  Write down what is normal for each breast and any changes that you find. Keep a written record with breast changes or normal findings for each breast. By writing this information down, you do not need to depend only on memory for size, tenderness, or location. Write down where you are in your menstrual cycle, if you are still menstruating. If you are having trouble noticing differences   in your breasts, do not get discouraged. With time you will become more familiar with the variations in your breasts and more comfortable with the exam. How often should I examine my breasts? Examine your breasts every month. If you are breastfeeding, the best time to examine your breasts is after a feeding or after using a breast pump. If you menstruate, the best time to examine your breasts is 5-7 days after your period is over. During your period, your breasts are lumpier, and it may be more difficult to notice changes. When should I see my health care provider? See your health care provider if you notice:  A change in shape or size of your breasts or nipples.  A change in the skin of your breast or nipples, such as a reddened or scaly area.  Unusual discharge from your nipples.  A lump or thick area that was not there before.  Pain in your breasts.  Anything that concerns you.  

## 2019-02-20 NOTE — Telephone Encounter (Signed)
Call placed to convey benefits for iud removal and Nexplanon.

## 2019-02-21 LAB — LIPID PANEL
Chol/HDL Ratio: 2.8 ratio (ref 0.0–4.4)
Cholesterol, Total: 118 mg/dL (ref 100–199)
HDL: 42 mg/dL (ref >39)
LDL Chol Calc (NIH): 57 mg/dL (ref 0–99)
Triglycerides: 99 mg/dL (ref 0–149)
VLDL Cholesterol Cal: 19 mg/dL (ref 5–40)

## 2019-02-21 LAB — COMPREHENSIVE METABOLIC PANEL
ALT: 10 IU/L (ref 0–32)
AST: 18 IU/L (ref 0–40)
Albumin/Globulin Ratio: 2.6 — ABNORMAL HIGH (ref 1.2–2.2)
Albumin: 4.9 g/dL (ref 3.9–5.0)
Alkaline Phosphatase: 57 IU/L (ref 39–117)
BUN/Creatinine Ratio: 12 (ref 9–23)
BUN: 10 mg/dL (ref 6–20)
Bilirubin Total: 0.4 mg/dL (ref 0.0–1.2)
CO2: 22 mmol/L (ref 20–29)
Calcium: 9.4 mg/dL (ref 8.7–10.2)
Chloride: 103 mmol/L (ref 96–106)
Creatinine, Ser: 0.84 mg/dL (ref 0.57–1.00)
GFR calc Af Amer: 115 mL/min/{1.73_m2} (ref >59)
GFR calc non Af Amer: 100 mL/min/{1.73_m2} (ref >59)
Globulin, Total: 1.9 g/dL (ref 1.5–4.5)
Glucose: 79 mg/dL (ref 65–99)
Potassium: 4.5 mmol/L (ref 3.5–5.2)
Sodium: 137 mmol/L (ref 134–144)
Total Protein: 6.8 g/dL (ref 6.0–8.5)

## 2019-02-21 LAB — RPR: RPR Ser Ql: NONREACTIVE

## 2019-02-21 LAB — CBC
Hematocrit: 43.6 % (ref 34.0–46.6)
Hemoglobin: 14.7 g/dL (ref 11.1–15.9)
MCH: 32.8 pg (ref 26.6–33.0)
MCHC: 33.7 g/dL (ref 31.5–35.7)
MCV: 97 fL (ref 79–97)
Platelets: 282 10*3/uL (ref 150–450)
RBC: 4.48 x10E6/uL (ref 3.77–5.28)
RDW: 11.3 % — ABNORMAL LOW (ref 11.7–15.4)
WBC: 6.1 10*3/uL (ref 3.4–10.8)

## 2019-02-21 LAB — HIV ANTIBODY (ROUTINE TESTING W REFLEX): HIV Screen 4th Generation wRfx: NONREACTIVE

## 2019-02-24 LAB — CYTOLOGY - PAP
Chlamydia: NEGATIVE
Diagnosis: NEGATIVE
Neisseria Gonorrhea: NEGATIVE
Trichomonas: NEGATIVE

## 2019-02-27 ENCOUNTER — Other Ambulatory Visit: Payer: Self-pay | Admitting: Family Medicine

## 2019-03-16 ENCOUNTER — Ambulatory Visit: Payer: Commercial Managed Care - PPO | Admitting: Allergy

## 2019-04-16 ENCOUNTER — Encounter: Payer: Self-pay | Admitting: Family Medicine

## 2019-04-16 ENCOUNTER — Other Ambulatory Visit: Payer: Self-pay

## 2019-04-16 ENCOUNTER — Telehealth (INDEPENDENT_AMBULATORY_CARE_PROVIDER_SITE_OTHER): Payer: Commercial Managed Care - PPO | Admitting: Family Medicine

## 2019-04-16 VITALS — Ht 65.0 in | Wt 125.0 lb

## 2019-04-16 DIAGNOSIS — M546 Pain in thoracic spine: Secondary | ICD-10-CM | POA: Diagnosis not present

## 2019-04-16 DIAGNOSIS — M542 Cervicalgia: Secondary | ICD-10-CM | POA: Diagnosis not present

## 2019-04-16 DIAGNOSIS — G8929 Other chronic pain: Secondary | ICD-10-CM

## 2019-04-16 DIAGNOSIS — G43109 Migraine with aura, not intractable, without status migrainosus: Secondary | ICD-10-CM

## 2019-04-16 DIAGNOSIS — M40202 Unspecified kyphosis, cervical region: Secondary | ICD-10-CM

## 2019-04-16 MED ORDER — TOPIRAMATE 25 MG PO TABS: 25.0000 mg | ORAL_TABLET | Freq: Every day | ORAL | 3 refills | Status: DC

## 2019-04-16 NOTE — Progress Notes (Signed)
Virtual Visit Note  I connected with patient on 04/16/19 at 445pm by doxyme video and verified that I am speaking with the correct person using two identifiers. Judy [name] is currently located at home and mother and patient is currently with them during visit. The provider, Myles Lipps, MD is located in their office at time of visit.  I discussed the limitations, risks, security and privacy concerns of performing an evaluation and management service by telephone and the availability of in person appointments. I also discussed with the patient that there may be a patient responsible charge related to this service. The patient expressed understanding and agreed to proceed.   CC: neck/upper back pain/ headaches  HPI ? Neck/upper back pain - She has long standing issues with neck and thoracic back pain. She gets pain along her neck, midline and both sides, it gets stiff and sore. She gets pain between her shoulder blades, feels at time numbness and tingling down her arms. Denies weakness. She manages with heat, ice, stretching, strengthening exercises, yoga, NSAIDs and muscles relaxers. For past several months she has been having pain daily. Spending lots of energy in coping/managing the pain. Triggers her migraines. Starting to interfer with her daily functions, work and overall quality of life. 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 Headaches usually start in the occipital region of head, achy, radiate forward and become pounding, associated with light and sound sensitivity. No dizziness, tinnitus associated with headaches. Manages with lying in dark room, nsaids, tries to minimize use of  maxalt. Other triggers wearing ponytails and hats. Starting to interfer with her daily functions, work and overall quality of life. Has never had brain imagining, has never taken prophylactic meds, has  Never seen specialist. Has IUD in place. Requesting MRI brain and referral to specialist. Wondering if having mixed headache types.  Allergies  Allergen Reactions  . Pollen Extract Other (See Comments)    Sneezing/itchy eyes/runny nose    Prior to Admission medications   Medication Sig Start Date End Date Taking? Authorizing Provider  albuterol (VENTOLIN HFA) 108 (90 Base) MCG/ACT inhaler Inhale 2 puffs into the lungs every 6 (six) hours as needed for wheezing (One for school and one for home). 12/11/18  Yes Ellamae Sia, DO  cetirizine (ZYRTEC) 10 MG tablet Take 1 tablet (10 mg total) by mouth daily as needed (for allergies.). 09/25/18  Yes Myles Lipps, MD  famotidine (PEPCID AC) 10 MG chewable tablet Chew 10 mg by mouth daily as needed for heartburn.   Yes [provider]  fluticasone (FLONASE) 50 MCG/ACT nasal spray Place 1 spray into both nostrils 2 (two) times daily. 12/11/18  Yes Ellamae Sia, DO  Levonorgestrel (KYLEENA IU) by Intrauterine route. 12/17/17  Yes [provider]  metaxalone (SKELAXIN) 400 MG tablet Take 1 tablet (400 mg total) by mouth 3 (three) times daily. 09/11/18  Yes Myles Lipps, MD  Naphazoline-Pheniramine (OPCON-A) 0.027-0.315 % SOLN Place 1 drop into both eyes 3 (three) times daily as needed (for irritated/itchy eyes.).   Yes [provider]  QVAR REDIHALER 80 MCG/ACT inhaler 1 puff 2 (two) times daily. 04/07/19  Yes [provider]  rizatriptan (MAXALT) 5 MG tablet TAKE 1 TABLET BY MOUTH AS NEEDED FOR MIGRAINE. MAY REPEAT IN 2 HOURS IF NEEDED 02/27/19  Yes Rutherford Guys, MD    Past Medical History:  Diagnosis Date  . Acne    tx with spironolactone  . Allergy    Has allergies to dogs and cats.  . Anxiety    no meds  .  Asthma    as a child, rarely uses inhaler  . Cat allergies 04/20/2013  . Depression    no meds  . GERD (gastroesophageal reflux disease)    occasional  . Migraine headache with aura    last one 04/2017, triggers lack of sleep-otc prn    Past Surgical History:  Procedure Laterality Date  . LESION REMOVAL N/A 05/28/2017   Procedure: PLASTICS REPAIR OF INTROIDUS;  Surgeon: Salvadore Dom, MD;  Location: [facility];  Service: Gynecology;  Laterality: N/A;  . WISDOM TOOTH EXTRACTION      Social History   Tobacco Use  . Smoking status: Never Smoker  . Smokeless tobacco: Never Used  Substance Use Topics  . Alcohol use: No    Family History  Problem Relation Age of Onset  . Hypertension Mother   . Hyperlipidemia Mother   . Scoliosis Mother   . Lupus Maternal Grandmother   . Osteoporosis Maternal Grandmother   . Heart disease Maternal Grandfather   . Hypertension Maternal Grandfather   . Diabetes Maternal Grandfather   . Emphysema Maternal Grandfather   . Heart attack Maternal Grandfather        occurred < 9 years old  . Hyperlipidemia Maternal Grandfather   . COPD Maternal Grandfather   . Cancer Maternal Grandfather        skin cancer  . Osteoarthritis Paternal Grandmother   . Diabetes Paternal Grandfather   . Heart disease Paternal Grandfather   . Kidney failure Paternal Grandfather        2 transplants due to DM  . Hypertension Paternal Grandfather   . Hyperlipidemia Paternal Grandfather   . Stroke Paternal Grandfather   . Parkinson's disease Paternal Grandfather     ROS Per hpi  Objective  Vitals as reported by the patient: none Gen: AAOx3, NAD Resp: breathing comfortably, speaking in full sentences   ASSESSMENT and PLAN  1. Migraine with aura and without status migrainosus, not intractable Worsening, occipital in origin, no associated basilar migraines sx, mri brain to r/o chiari. Starting topamax 25mg  at bedtime, discussed titration to 50mg  if  needed. Reviewed r/se/b. Referring to specialist as requested.   2. Neck pain, chronic 3. Kyphosis of cervical region, unspecified kyphosis type Worsening despite adherence to conservative treatment. MRI cervical spine given worsening pain and paresthesias. Referring to PMR for further eval and treat    Other orders Orders Placed This Encounter  Procedures  . MR BRAIN W CONTRAST    Standing Status:   Future    Standing Expiration Date:   06/15/2020    Order Specific Question:   ** REASON FOR EXAM (FREE TEXT)    Answer:   migraines with aura, worsening, occipital origin, chiari malformation?    Order Specific Question:   If indicated for the ordered procedure, I authorize the administration of contrast media per Radiology protocol    Answer:   Yes    Order Specific Question:   What is the patient's sedation requirement?    Answer:   No Sedation    Order Specific Question:  Does the patient have a pacemaker or implanted devices?    Answer:   No    Order Specific Question:   Radiology Contrast Protocol - do NOT remove file path    Answer:   \\[email or web address]    Order Specific Question:   Preferred imaging location?    Answer:   [facility] (table limit-550lbs)  . MR Cervical Spine Wo Contrast    Standing Status:   Future    Standing Expiration Date:   06/15/2020    Order Specific Question:   ** REASON FOR EXAM (FREE TEXT)    Answer:   patient with cervical kyphosis, blateral paresthesias, worsening despite conservative measures    Order Specific Question:   What is the patient's sedation requirement?    Answer:   No Sedation    Order Specific Question:   Does the patient have a pacemaker or implanted devices?    Answer:   No    Order Specific Question:   Preferred imaging location?    Answer:   [facility] (table limit-550lbs)    Order Specific Question:   Radiology Contrast Protocol - do NOT remove file path    Answer:    \\[email or web address]  . Basic Metabolic Panel    Standing Status:   Future    Standing Expiration Date:   04/15/2020  . AMB referral to headache clinic    Referral Priority:   Routine    Referral Type:   Consultation    Number of Visits Requested:   1  . Ambulatory referral to Physical Medicine Rehab    Referral Priority:   Routine    Referral Type:   Rehabilitation    Referral Reason:   Specialty Services Required    Referred to Provider:   Ranelle OysterSwartz, Zachary T, MD    Requested Specialty:   Physical Medicine and Rehabilitation    Number of Visits Requested:   1    FOLLOW-UP: 3 months   The above assessment and management plan was discussed with the patient. The patient verbalized understanding of and has agreed to the management plan. Patient is aware to call the clinic if symptoms persist or worsen. Patient is aware when to return to the clinic for a follow-up visit. Patient educated on when it is appropriate to go to the emergency department.    I provided 25 minutes of non-face-to-face time during this encounter.  Myles LippsIrma M Santiago, MD [facility] [address] [place], [place] [address] Ph.  [phone] Fax [phone]

## 2019-04-20 ENCOUNTER — Telehealth: Payer: Self-pay | Admitting: Family Medicine

## 2019-04-20 NOTE — Telephone Encounter (Signed)
Pt was having an increased heart rate and lightheaded. Pt is wanting to know if that is a side effect of the medication Rizatriptan that Dr. Pamella Pert prescribed on 02/27/2019. And she wants to discuss the medication with a clinical staff member. Please advise.

## 2019-04-21 ENCOUNTER — Telehealth: Payer: Commercial Managed Care - PPO | Admitting: Family Medicine

## 2019-04-22 NOTE — Telephone Encounter (Signed)
Attempted to call pt back. No answer so I left a message to call back.  

## 2019-04-30 NOTE — Telephone Encounter (Signed)
Provider has given advice thru my-chart to pt about concerns on 04/16/2019.

## 2019-05-04 ENCOUNTER — Telehealth: Payer: Self-pay | Admitting: Obstetrics and Gynecology

## 2019-05-04 DIAGNOSIS — Z30432 Encounter for removal of intrauterine contraceptive device: Secondary | ICD-10-CM

## 2019-05-04 NOTE — Telephone Encounter (Signed)
Call to patient. Patient states she is having "second thoughts" and would like to proceed with having IUD removed. Patient states she is thinking about getting the nexplanon, but would like to discuss with Dr. Talbert Nan. IUD removal scheduled for 05-12-2019 at 1600. Patient agreeable to date and time of appointment. Future order placed for IUD removal. Patient advised would be contacted by business office to review benefits for procedure.   Routing to provider and will close encounter.

## 2019-05-04 NOTE — Telephone Encounter (Signed)
Patient would like to schedule IUD removal and discuss other options.

## 2019-05-05 ENCOUNTER — Telehealth: Payer: Self-pay | Admitting: Family Medicine

## 2019-05-05 NOTE — Telephone Encounter (Signed)
SENT REFERRAL FAX TO FOR THIS PT TO [place] 11.24.2020 FR

## 2019-05-06 NOTE — Addendum Note (Signed)
Addended by: Garnet Sierras on: 05/06/2019 01:43 PM   Modules accepted: Orders

## 2019-05-12 ENCOUNTER — Ambulatory Visit (INDEPENDENT_AMBULATORY_CARE_PROVIDER_SITE_OTHER): Payer: Commercial Managed Care - PPO | Admitting: Obstetrics and Gynecology

## 2019-05-12 ENCOUNTER — Other Ambulatory Visit: Payer: Self-pay

## 2019-05-12 ENCOUNTER — Encounter: Payer: Self-pay | Admitting: Obstetrics and Gynecology

## 2019-05-12 ENCOUNTER — Ambulatory Visit: Payer: Commercial Managed Care - PPO

## 2019-05-12 VITALS — BP 118/76 | HR 72 | Temp 97.1°F | Wt 121.8 lb

## 2019-05-12 DIAGNOSIS — Z30432 Encounter for removal of intrauterine contraceptive device: Secondary | ICD-10-CM

## 2019-05-12 DIAGNOSIS — G43109 Migraine with aura, not intractable, without status migrainosus: Secondary | ICD-10-CM

## 2019-05-12 DIAGNOSIS — Z3009 Encounter for other general counseling and advice on contraception: Secondary | ICD-10-CM

## 2019-05-12 NOTE — Progress Notes (Signed)
VIALS EXP 05-12-20 

## 2019-05-12 NOTE — Progress Notes (Signed)
GYNECOLOGY  VISIT   HPI: 23 y.o.   Single White or Caucasian Hispanic or Latino  female   G0P0000 with Patient's last menstrual period was 04/21/2019 (exact date).   here for IUD removal and would like to discuss alternative birth control options.  She has had worsening cramps with the kyleena.  She has a h/o migraine with aura, not a candidate for OCP's.   GYNECOLOGIC HISTORY: Patient's last menstrual period was 04/21/2019 (exact date). Contraception: IUD Menopausal hormone therapy: None        OB History    Gravida  0   Para  0   Term  0   Preterm  0   AB  0   Living  0     SAB  0   TAB  0   Ectopic  0   Multiple  0   Live Births  0              Patient Active Problem List   Diagnosis Date Noted  . Mild persistent asthma without complication 12/11/2018  . Other allergic rhinitis 12/11/2018  . Allergic conjunctivitis of both eyes 12/11/2018  . 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 Costochondritis 02/04/2014  . Unspecified asthma(493.90) 04/20/2013  . Cat allergies 04/20/2013  . Seasonal allergies 04/20/2013    Past Medical History:  Diagnosis Date  . Acne    tx with spironolactone  . Allergy    Has allergies to dogs and cats.  . Anxiety    no meds  . Asthma    as a child, rarely uses inhaler  . Cat allergies 04/20/2013  . Depression    no meds  . GERD (gastroesophageal reflux disease)    occasional  . Migraine headache with aura    last one 04/2017, triggers lack of sleep-otc prn    Past Surgical History:  Procedure Laterality Date  . LESION REMOVAL N/A 05/28/2017   Procedure: PLASTICS REPAIR OF INTROIDUS;  Surgeon: Romualdo BolkJertson, Kienna Moncada Evelyn, MD;  Location: [facility];  Service: Gynecology;  Laterality: N/A;  . WISDOM TOOTH EXTRACTION      Current Outpatient Medications  Medication Sig Dispense Refill  . albuterol (VENTOLIN HFA) 108 (90 Base) MCG/ACT inhaler Inhale 2 puffs into the lungs every 6 (six) hours as needed for wheezing (One for  school and one for home). 18 g 1  . cetirizine (ZYRTEC) 10 MG tablet Take 1 tablet (10 mg total) by mouth daily as needed (for allergies.). 30 tablet 2  . famotidine (PEPCID AC) 10 MG chewable tablet Chew 10 mg by mouth daily as needed for heartburn.    . fluticasone (FLONASE) 50 MCG/ACT nasal spray Place 1 spray into both nostrils 2 (two) times daily. 16 g 6  . Levonorgestrel (KYLEENA IU) by Intrauterine route.    . metaxalone (SKELAXIN) 400 MG tablet Take 1 tablet (400 mg total) by mouth 3 (three) times daily. 90 tablet 3  . Naphazoline-Pheniramine (OPCON-A) 0.027-0.315 % SOLN Place 1 drop into both eyes 3 (three) times daily as needed (for irritated/itchy eyes.).    Marland Kitchen. QVAR REDIHALER 80 MCG/ACT inhaler 1 puff 2 (two) times daily.    . rizatriptan (MAXALT) 5 MG tablet TAKE 1 TABLET BY MOUTH AS NEEDED FOR MIGRAINE. MAY REPEAT IN 2 HOURS IF NEEDED (Patient not taking: Reported on 05/12/2019) 30 tablet 1  . topiramate (TOPAMAX) 25 MG tablet Take 1-2 tablets (25-50 mg total) by mouth at bedtime. (Patient not taking: Reported on 05/12/2019) 60 tablet 3  No current facility-administered medications for this visit.      ALLERGIES: Pollen extract  Family History  Problem Relation Age of Onset  . Hypertension Mother   . Hyperlipidemia Mother   . Scoliosis Mother   . Lupus Maternal Grandmother   . Osteoporosis Maternal Grandmother   . Heart disease Maternal Grandfather   . Hypertension Maternal Grandfather   . Diabetes Maternal Grandfather   . Emphysema Maternal Grandfather   . Heart attack Maternal Grandfather        occurred < 21 years old  . Hyperlipidemia Maternal Grandfather   . COPD Maternal Grandfather   . Cancer Maternal Grandfather        skin cancer  . Osteoarthritis Paternal Grandmother   . Diabetes Paternal Grandfather   . Heart disease Paternal Grandfather   . Kidney failure Paternal Grandfather        2 transplants due to DM  . Hypertension Paternal Grandfather   .  Hyperlipidemia Paternal Grandfather   . Stroke Paternal Grandfather   . Parkinson's disease Paternal Grandfather     Social History   Socioeconomic History  . Marital status: Single    Spouse name: Not on file  . Number of children: Not on file  . Years of education: Not on file  . Highest education level: Not on file  Occupational History  . Not on file  Social Needs  . Financial resource strain: Not on file  . Food insecurity    Worry: Not on file    Inability: Not on file  . Transportation needs    Medical: Not on file    Non-medical: Not on file  Tobacco Use  . Smoking status: Never Smoker  . Smokeless tobacco: Never Used  Substance and Sexual Activity  . Alcohol use: No  . Drug use: No  . Sexual activity: Yes    Birth control/protection: I.U.D.    Comment: female partner  Lifestyle  . Physical activity    Days per week: Not on file    Minutes per session: Not on file  . Stress: Not on file  Relationships  . Social Herbalist on phone: Not on file    Gets together: Not on file    Attends religious service: Not on file    Active member of club or organization: Not on file    Attends meetings of clubs or organizations: Not on file    Relationship status: Not on file  . Intimate partner violence    Fear of current or ex partner: Not on file    Emotionally abused: Not on file    Physically abused: Not on file    Forced sexual activity: Not on file  Other Topics Concern  . Not on file  Social History Narrative  . Not on file    Review of Systems  Constitutional: Negative.   HENT: Negative.   Eyes: Negative.   Respiratory: Negative.   Cardiovascular: Negative.   Gastrointestinal: Negative.   Genitourinary:       Urinary incontinence  Musculoskeletal: Negative.   Skin: Negative.   Neurological: Negative.   Endo/Heme/Allergies: Negative.   Psychiatric/Behavioral: Negative.     PHYSICAL EXAMINATION:    BP 118/76 (BP Location: Right Arm,  Patient Position: Sitting, Cuff Size: Normal)   Pulse 72   Temp (!) 97.1 F (36.2 C) (Skin)   Wt 121 lb 12.8 oz (55.2 kg)   LMP 04/21/2019 (Exact Date)   BMI 20.27 kg/m  General appearance: alert, cooperative and appears stated age  Pelvic: External genitalia:  no lesions              Urethra:  normal appearing urethra with no masses, tenderness or lesions              Bartholins and Skenes: normal                 Vagina: normal appearing vagina with normal color and discharge, no lesions              Cervix:no lesions, IUD string 2-3 cm. IUD removed with ringed forceps  Chaperone was present for exam.  ASSESSMENT Contraception counseling, discussed options of the mini-pill, depo-provera, nexplanon and barrier methods. She doesn't like the IUD, can't take OCP's    PLAN IUD removed today (she requested) Will return for nexplanon insertion within the next week, she will abstain from intercourse until then   An After Visit Summary was printed and given to the patient.  Over 15 minutes face to face time of which over 50% was spent in counseling.

## 2019-05-13 DIAGNOSIS — J301 Allergic rhinitis due to pollen: Secondary | ICD-10-CM

## 2019-05-13 LAB — BASIC METABOLIC PANEL
BUN/Creatinine Ratio: 12 (ref 9–23)
BUN: 9 mg/dL (ref 6–20)
CO2: 21 mmol/L (ref 20–29)
Calcium: 9.8 mg/dL (ref 8.7–10.2)
Chloride: 104 mmol/L (ref 96–106)
Creatinine, Ser: 0.75 mg/dL (ref 0.57–1.00)
GFR calc Af Amer: 132 mL/min/{1.73_m2} (ref >59)
GFR calc non Af Amer: 114 mL/min/{1.73_m2} (ref >59)
Glucose: 101 mg/dL — ABNORMAL HIGH (ref 65–99)
Potassium: 4.1 mmol/L (ref 3.5–5.2)
Sodium: 139 mmol/L (ref 134–144)

## 2019-05-14 DIAGNOSIS — J3089 Other allergic rhinitis: Secondary | ICD-10-CM

## 2019-05-18 ENCOUNTER — Ambulatory Visit
Admission: RE | Admit: 2019-05-18 | Discharge: 2019-05-18 | Disposition: A | Discharge status: Home / Self Care | Payer: Commercial Managed Care - PPO | Source: Ambulatory Visit | Attending: Family Medicine | Admitting: Family Medicine

## 2019-05-18 ENCOUNTER — Other Ambulatory Visit: Payer: Self-pay

## 2019-05-18 DIAGNOSIS — G8929 Other chronic pain: Secondary | ICD-10-CM

## 2019-05-18 DIAGNOSIS — M40202 Unspecified kyphosis, cervical region: Secondary | ICD-10-CM

## 2019-05-18 DIAGNOSIS — G43109 Migraine with aura, not intractable, without status migrainosus: Secondary | ICD-10-CM

## 2019-05-18 DIAGNOSIS — M542 Cervicalgia: Secondary | ICD-10-CM

## 2019-05-18 MED ORDER — GADOBENATE DIMEGLUMINE 529 MG/ML IV SOLN
11.0000 mL | Freq: Once | INTRAVENOUS | Status: AC | PRN
  Administered 2019-05-18: 11 mL via INTRAVENOUS

## 2019-05-19 NOTE — Progress Notes (Signed)
GYNECOLOGY  VISIT   HPI: 23 y.o.   Single White or Caucasian Hispanic or Latino  female   G0P0000 with Patient's last menstrual period was 04/21/2019 (exact date).   here for Nexplanon insertion LMP 05/13/19 not sexually active since then.   GYNECOLOGIC HISTORY: Patient's last menstrual period was 04/21/2019 (exact date). Contraception:Abstinence after IUD removal Menopausal hormone therapy: None        OB History    Gravida  0   Para  0   Term  0   Preterm  0   AB  0   Living  0     SAB  0   TAB  0   Ectopic  0   Multiple  0   Live Births  0              Patient Active Problem List   Diagnosis Date Noted  . Mild persistent asthma without complication 12/11/2018  . Other allergic rhinitis 12/11/2018  . Allergic conjunctivitis of both eyes 12/11/2018  . 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 Costochondritis 02/04/2014  . Unspecified asthma(493.90) 04/20/2013  . Cat allergies 04/20/2013  . Seasonal allergies 04/20/2013    Past Medical History:  Diagnosis Date  . Acne    tx with spironolactone  . Allergy    Has allergies to dogs and cats.  . Anxiety    no meds  . Asthma    as a child, rarely uses inhaler  . Cat allergies 04/20/2013  . Depression    no meds  . GERD (gastroesophageal reflux disease)    occasional  . Migraine headache with aura    last one 04/2017, triggers lack of sleep-otc prn    Past Surgical History:  Procedure Laterality Date  . LESION REMOVAL N/A 05/28/2017   Procedure: PLASTICS REPAIR OF INTROIDUS;  Surgeon: Romualdo BolkJertson,  Evelyn, MD;  Location: [facility];  Service: Gynecology;  Laterality: N/A;  . WISDOM TOOTH EXTRACTION      Current Outpatient Medications  Medication Sig Dispense Refill  . albuterol (VENTOLIN HFA) 108 (90 Base) MCG/ACT inhaler Inhale 2 puffs into the lungs every 6 (six) hours as needed for wheezing (One for school and one for home). 18 g 1  . cetirizine (ZYRTEC) 10 MG tablet Take 1 tablet (10 mg total)  by mouth daily as needed (for allergies.). 30 tablet 2  . famotidine (PEPCID AC) 10 MG chewable tablet Chew 10 mg by mouth daily as needed for heartburn.    . fluticasone (FLONASE) 50 MCG/ACT nasal spray Place 1 spray into both nostrils 2 (two) times daily. 16 g 6  . metaxalone (SKELAXIN) 400 MG tablet Take 1 tablet (400 mg total) by mouth 3 (three) times daily. 90 tablet 3  . Naphazoline-Pheniramine (OPCON-A) 0.027-0.315 % SOLN Place 1 drop into both eyes 3 (three) times daily as needed (for irritated/itchy eyes.).    Marland Kitchen. QVAR REDIHALER 80 MCG/ACT inhaler 1 puff 2 (two) times daily.    . rizatriptan (MAXALT) 5 MG tablet TAKE 1 TABLET BY MOUTH AS NEEDED FOR MIGRAINE. MAY REPEAT IN 2 HOURS IF NEEDED (Patient not taking: Reported on 05/21/2019) 30 tablet 1  . topiramate (TOPAMAX) 25 MG tablet Take 1-2 tablets (25-50 mg total) by mouth at bedtime. (Patient not taking: Reported on 05/21/2019) 60 tablet 3   No current facility-administered medications for this visit.     ALLERGIES: Pollen extract  Family History  Problem Relation Age of Onset  . Hypertension Mother   . Hyperlipidemia  Mother   . Scoliosis Mother   . Lupus Maternal Grandmother   . Osteoporosis Maternal Grandmother   . Heart disease Maternal Grandfather   . Hypertension Maternal Grandfather   . Diabetes Maternal Grandfather   . Emphysema Maternal Grandfather   . Heart attack Maternal Grandfather        occurred < 68 years old  . Hyperlipidemia Maternal Grandfather   . COPD Maternal Grandfather   . Cancer Maternal Grandfather        skin cancer  . Osteoarthritis Paternal Grandmother   . Diabetes Paternal Grandfather   . Heart disease Paternal Grandfather   . Kidney failure Paternal Grandfather        2 transplants due to DM  . Hypertension Paternal Grandfather   . Hyperlipidemia Paternal Grandfather   . Stroke Paternal Grandfather   . Parkinson's disease Paternal Grandfather     Social History   Socioeconomic History   . Marital status: Single    Spouse name: Not on file  . Number of children: Not on file  . Years of education: Not on file  . Highest education level: Not on file  Occupational History  . Not on file  Tobacco Use  . Smoking status: Never Smoker  . Smokeless tobacco: Never Used  Substance and Sexual Activity  . Alcohol use: No  . Drug use: No  . Sexual activity: Not Currently    Birth control/protection: Abstinence    Comment: female partner  Other Topics Concern  . Not on file  Social History Narrative  . Not on file   Social Determinants of Health   Financial Resource Strain:   . Difficulty of Paying Living Expenses: Not on file  Food Insecurity:   . Worried About Programme researcher, broadcasting/film/video in the Last Year: Not on file  . Ran Out of Food in the Last Year: Not on file  Transportation Needs:   . Lack of Transportation (Medical): Not on file  . Lack of Transportation (Non-Medical): Not on file  Physical Activity:   . Days of Exercise per Week: Not on file  . Minutes of Exercise per Session: Not on file  Stress:   . Feeling of Stress : Not on file  Social Connections:   . Frequency of Communication with Friends and Family: Not on file  . Frequency of Social Gatherings with Friends and Family: Not on file  . Attends Religious Services: Not on file  . Active Member of Clubs or Organizations: Not on file  . Attends Banker Meetings: Not on file  . Marital Status: Not on file  Intimate Partner Violence:   . Fear of Current or Ex-Partner: Not on file  . Emotionally Abused: Not on file  . Physically Abused: Not on file  . Sexually Abused: Not on file    Review of Systems  Constitutional: Negative.   HENT: Negative.   Eyes: Negative.   Respiratory: Negative.   Cardiovascular: Negative.   Gastrointestinal: Negative.   Genitourinary: Negative.   Musculoskeletal: Negative.   Skin: Negative.   Neurological: Negative.   Endo/Heme/Allergies: Negative.    Psychiatric/Behavioral: Negative.     PHYSICAL EXAMINATION:    BP 126/72 (BP Location: Right Arm, Patient Position: Sitting, Cuff Size: Normal)   Pulse 72   Temp (!) 97.2 F (36.2 C) (Skin)   Wt 121 lb 9.6 oz (55.2 kg)   LMP 04/21/2019 (Exact Date)   BMI 20.24 kg/m     General appearance: alert, cooperative  and appears stated age  Risks of nexplanon removal and reinsertion were reviewed with the patient and a consent was signed.  The patient was placed in the supine position with her left arm bent at the elbow. Her arm was marked with a sharpie 8 and 10 cm proximal to the medial epicondyl. The area was cleansed with betadine and injected with 1% lidocaine along the path where the nexplanon would be placed. The nexplanon device was inserted in the usual fashion without difficulty. Slight oozing from the insertion site was stopped with pressure. The device was palpated in place.  The patients arm was cleansed of betadine and a steri strip was placed over the incision. A gauze was wrapped around her arm.  She tolerated the procedure well  Instructions for care were discussed.     ASSESSMENT Contraception management    PLAN Nexplanon insertion Use back up contraception for one week   An After Visit Summary was printed and given to the patient.

## 2019-05-20 ENCOUNTER — Telehealth: Payer: Self-pay | Admitting: Obstetrics and Gynecology

## 2019-05-20 ENCOUNTER — Other Ambulatory Visit: Payer: Self-pay

## 2019-05-20 NOTE — Telephone Encounter (Signed)
Patient is calling with questions regarding Nexplanon insertion. Patient stated that she lifts heavy boxes at work and is wanting to make sure there are no restrictions regarding Nexplanon insertion.

## 2019-05-20 NOTE — Telephone Encounter (Signed)
Left message for pt to call back to Keandria Berrocal, RN triage. 

## 2019-05-20 NOTE — Telephone Encounter (Signed)
Pt returned call. Pt wanting to know lift restrictions for Nexplanon insertion that is scheduled 12/10 at 3pm with Dr Talbert Nan. Informed pt of restrictions of no heavy lifting for 24 hours after insertion. Pt states working for [employer] and might be difficult. Will discuss more with Dr Talbert Nan at [facility]. Pt agreeable.   Will route to Dr Talbert Nan for review and will close encounter.

## 2019-05-21 ENCOUNTER — Encounter: Payer: Self-pay | Admitting: Obstetrics and Gynecology

## 2019-05-21 ENCOUNTER — Ambulatory Visit: Payer: Commercial Managed Care - PPO | Admitting: Obstetrics and Gynecology

## 2019-05-21 DIAGNOSIS — Z3009 Encounter for other general counseling and advice on contraception: Secondary | ICD-10-CM

## 2019-05-21 NOTE — Patient Instructions (Signed)
Nexplanon Instructions After Insertion  Keep bandage clean and dry for 24 hours  May use ice/Tylenol/Ibuprofen for soreness or pain  If you develop fever, drainage or increased warmth from incision site-contact office immediately   

## 2019-05-26 ENCOUNTER — Ambulatory Visit (INDEPENDENT_AMBULATORY_CARE_PROVIDER_SITE_OTHER): Payer: Commercial Managed Care - PPO

## 2019-05-26 ENCOUNTER — Other Ambulatory Visit: Payer: Self-pay

## 2019-05-26 DIAGNOSIS — J3089 Other allergic rhinitis: Secondary | ICD-10-CM

## 2019-05-26 MED ORDER — EPINEPHRINE 0.3 MG/0.3ML IJ SOAJ: 0.3000 mg | Freq: Once | INTRAMUSCULAR | 1 refills | Status: DC | PRN

## 2019-05-26 NOTE — Progress Notes (Signed)
Immunotherapy   Patient Details  Name: Judy [name] MRN: [id number] Date of Birth: December 23, 1995  05/26/2019  Judy [name] started injections for  M-[id number] & G-[id number] Following schedule: B Frequency:1 time per week Epi-Pen:Prescription for Epi-Pen given Consent signed and patient instructions given. Patient waited 30 minutes post injection in office. No local or systemic reactions noted upon departing office.     Lonn Georgia I Klani Caridi 05/26/2019, 4:10 PM

## 2019-05-29 ENCOUNTER — Other Ambulatory Visit: Payer: Self-pay | Admitting: Family Medicine

## 2019-06-02 ENCOUNTER — Ambulatory Visit (INDEPENDENT_AMBULATORY_CARE_PROVIDER_SITE_OTHER): Payer: Commercial Managed Care - PPO

## 2019-06-02 DIAGNOSIS — J3089 Other allergic rhinitis: Secondary | ICD-10-CM | POA: Diagnosis not present

## 2019-06-07 ENCOUNTER — Other Ambulatory Visit: Payer: Self-pay

## 2019-06-07 ENCOUNTER — Emergency Department (HOSPITAL_COMMUNITY)
Admission: EM | Admit: 2019-06-07 | Discharge: 2019-06-07 | Disposition: A | Discharge status: Home / Self Care | Payer: Commercial Managed Care - PPO | Attending: Emergency Medicine | Admitting: Emergency Medicine

## 2019-06-07 ENCOUNTER — Encounter (HOSPITAL_COMMUNITY): Payer: Self-pay

## 2019-06-07 DIAGNOSIS — E119 Type 2 diabetes mellitus without complications: Secondary | ICD-10-CM | POA: Insufficient documentation

## 2019-06-07 DIAGNOSIS — Z8673 Personal history of transient ischemic attack (TIA), and cerebral infarction without residual deficits: Secondary | ICD-10-CM | POA: Diagnosis not present

## 2019-06-07 DIAGNOSIS — J45909 Unspecified asthma, uncomplicated: Secondary | ICD-10-CM | POA: Diagnosis present

## 2019-06-07 DIAGNOSIS — I10 Essential (primary) hypertension: Secondary | ICD-10-CM | POA: Insufficient documentation

## 2019-06-07 DIAGNOSIS — G43809 Other migraine, not intractable, without status migrainosus: Secondary | ICD-10-CM | POA: Insufficient documentation

## 2019-06-07 LAB — I-STAT BETA HCG BLOOD, ED (MC, WL, AP ONLY): I-stat hCG, quantitative: <5 m[IU]/mL (ref <5)

## 2019-06-07 MED ORDER — METOCLOPRAMIDE HCL 5 MG/ML IJ SOLN: 5.0000 mg | Freq: Once | INTRAMUSCULAR | Status: DC

## 2019-06-07 MED ORDER — IBUPROFEN 200 MG PO TABS
600.0000 mg | ORAL_TABLET | Freq: Once | ORAL | Status: AC
  Administered 2019-06-07: 600 mg via ORAL
  Filled 2019-06-07: qty 3

## 2019-06-07 MED ORDER — DIPHENHYDRAMINE HCL 50 MG/ML IJ SOLN
25.0000 mg | Freq: Once | INTRAMUSCULAR | Status: AC
  Administered 2019-06-07: 25 mg via INTRAVENOUS
  Filled 2019-06-07: qty 1

## 2019-06-07 MED ORDER — METOCLOPRAMIDE HCL 5 MG/ML IJ SOLN
10.0000 mg | Freq: Once | INTRAMUSCULAR | Status: AC
  Administered 2019-06-07: 10 mg via INTRAVENOUS
  Filled 2019-06-07: qty 2

## 2019-06-07 MED ORDER — ACETAMINOPHEN 325 MG PO TABS
650.0000 mg | ORAL_TABLET | Freq: Once | ORAL | Status: AC
  Administered 2019-06-07: 650 mg via ORAL
  Filled 2019-06-07: qty 2

## 2019-06-07 MED ORDER — DIPHENHYDRAMINE HCL 50 MG/ML IJ SOLN: 12.5000 mg | Freq: Once | INTRAMUSCULAR | Status: DC

## 2019-06-07 MED ORDER — SODIUM CHLORIDE 0.9 % IV BOLUS
1000.0000 mL | Freq: Once | INTRAVENOUS | Status: AC
  Administered 2019-06-07: 1000 mL via INTRAVENOUS

## 2019-06-07 NOTE — ED Provider Notes (Addendum)
[facility]-EMERGENCY DEPT Provider Note   CSN: [id number] Arrival date & time: 06/07/19  2017     History Chief Complaint  Patient presents with  . Migraine    Judy [name] is a 23 y.o. female with a past medical history of migraines, asthma presenting to the ED with a chief complaint of migraine.  States that 6 hours ago she was driving home from the beach when she started having gradual onset of migraine.  States that usually her triggers are bright light in certain positionings.  She feels that this is what triggered her symptoms today.  She reports associated photophobia and nausea which is typical of her migraines.  She took 5 mg of baclofen as prescribed by her PCP with no improvement in her symptoms.  States that she was recently started on baclofen and is unsure if this medication will help with her migraines.  Denies any vision changes, vomiting, numbness in arms or legs, head injuries or falls, neck stiffness, sick contacts with similar symptoms or possibility of pregnancy.  HPI     Past Medical History:  Diagnosis Date  . Acne    tx with spironolactone  . Allergy    Has allergies to dogs and cats.  . Anxiety    no meds  . Asthma    as a child, rarely uses inhaler  . Cat allergies 04/20/2013  . Depression    no meds  . GERD (gastroesophageal reflux disease)    occasional  . Migraine headache with aura    last one 04/2017, triggers lack of sleep-otc prn    Patient Active Problem List   Diagnosis Date Noted  . Mild persistent asthma without complication 12/11/2018  . Other allergic rhinitis 12/11/2018  . Allergic conjunctivitis of both eyes 12/11/2018  . 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 Costochondritis 02/04/2014  . Unspecified asthma(493.90) 04/20/2013  . Cat allergies 04/20/2013  . Seasonal allergies 04/20/2013    Past Surgical History:  Procedure Laterality Date  . LESION REMOVAL N/A 05/28/2017   Procedure: PLASTICS REPAIR  OF INTROIDUS;  Surgeon: Romualdo BolkJertson, Jill Evelyn, MD;  Location: [facility];  Service: Gynecology;  Laterality: N/A;  . WISDOM TOOTH EXTRACTION       OB History    Gravida  0   Para  0   Term  0   Preterm  0   AB  0   Living  0     SAB  0   TAB  0   Ectopic  0   Multiple  0   Live Births  0           Family History  Problem Relation Age of Onset  . Hypertension Mother   . Hyperlipidemia Mother   . Scoliosis Mother   . Lupus Maternal Grandmother   . Osteoporosis Maternal Grandmother   . Heart disease Maternal Grandfather   . Hypertension Maternal Grandfather   . Diabetes Maternal Grandfather   . Emphysema Maternal Grandfather   . Heart attack Maternal Grandfather        occurred < 21 years old  . Hyperlipidemia Maternal Grandfather   . COPD Maternal Grandfather   . Cancer Maternal Grandfather        skin cancer  . Osteoarthritis Paternal Grandmother   . Diabetes Paternal Grandfather   . Heart disease Paternal Grandfather   . Kidney failure Paternal Grandfather        2 transplants due to DM  . Hypertension Paternal  Grandfather   . Hyperlipidemia Paternal Grandfather   . Stroke Paternal Grandfather   . Parkinson's disease Paternal Grandfather     Social History   Tobacco Use  . Smoking status: Never Smoker  . Smokeless tobacco: Never Used  Substance Use Topics  . Alcohol use: No  . Drug use: No    Home Medications Prior to Admission medications   Medication Sig Start Date End Date Taking? Authorizing Provider  albuterol (VENTOLIN HFA) 108 (90 Base) MCG/ACT inhaler Inhale 2 puffs into the lungs every 6 (six) hours as needed for wheezing (One for school and one for home). 12/11/18   Ellamae Sia, DO  cetirizine (ZYRTEC) 10 MG tablet Take 1 tablet (10 mg total) by mouth daily as needed (for allergies.). 09/25/18   Myles Lipps, MD  EPINEPHrine 0.3 mg/0.3 mL IJ SOAJ injection Inject 0.3 mLs (0.3 mg total) into the muscle once as needed for anaphylaxis.  05/26/19   Kozlow, Alvira Philips, MD  famotidine (PEPCID AC) 10 MG chewable tablet Chew 10 mg by mouth daily as needed for heartburn.    [provider]  fluticasone (FLONASE) 50 MCG/ACT nasal spray Place 1 spray into both nostrils 2 (two) times daily. 12/11/18   Ellamae Sia, DO  metaxalone (SKELAXIN) 400 MG tablet Take 1 tablet (400 mg total) by mouth 3 (three) times daily. 09/11/18   Myles Lipps, MD  Naphazoline-Pheniramine (OPCON-A) 0.027-0.315 % SOLN Place 1 drop into both eyes 3 (three) times daily as needed (for irritated/itchy eyes.).    [provider]  QVAR REDIHALER 80 MCG/ACT inhaler 1 puff 2 (two) times daily. 04/07/19   [provider]  rizatriptan (MAXALT) 5 MG tablet TAKE 1 TABLET BY MOUTH AS NEEDED FOR MIGRAINE. MAY REPEAT IN 2 HOURS IF NEEDED 05/29/19   Myles Lipps, MD  topiramate (TOPAMAX) 25 MG tablet Take 1-2 tablets (25-50 mg total) by mouth at bedtime. Patient not taking: Reported on 05/21/2019 04/16/19   Myles Lipps, MD    Allergies    Pollen extract  Review of Systems   Review of Systems  Constitutional: Negative for appetite change, chills and fever.  HENT: Negative for ear pain, rhinorrhea, sneezing and sore throat.   Eyes: Negative for photophobia and visual disturbance.  Respiratory: Negative for cough, chest tightness, shortness of breath and wheezing.   Cardiovascular: Negative for chest pain and palpitations.  Gastrointestinal: Negative for abdominal pain, blood in stool, constipation, diarrhea, nausea and vomiting.  Genitourinary: Negative for dysuria, hematuria and urgency.  Musculoskeletal: Negative for myalgias.  Skin: Negative for rash.  Neurological: Positive for headaches. Negative for dizziness, weakness and light-headedness.    Physical Exam Updated Vital Signs BP 126/73 (BP Location: Left Arm)   Pulse 77   Temp 99 F (37.2 C) (Oral)   Resp 13   LMP 05/13/2019 (Exact Date)   SpO2 100%   Physical Exam Vitals  and nursing note reviewed.  Constitutional:      General: She is not in acute distress.    Appearance: She is well-developed.  HENT:     Head: Normocephalic and atraumatic.     Nose: Nose normal.  Eyes:     General: No scleral icterus.       Right eye: No discharge.        Left eye: No discharge.     Conjunctiva/sclera: Conjunctivae normal.     Pupils: Pupils are equal, round, and reactive to light.  Neck:  Comments: No meningismus. Cardiovascular:     Rate and Rhythm: Normal rate and regular rhythm.     Heart sounds: Normal heart sounds. No murmur. No friction rub. No gallop.   Pulmonary:     Effort: Pulmonary effort is normal. No respiratory distress.     Breath sounds: Normal breath sounds.  Abdominal:     General: Bowel sounds are normal. There is no distension.     Palpations: Abdomen is soft.     Tenderness: There is no abdominal tenderness. There is no guarding.  Musculoskeletal:        General: Normal range of motion.     Cervical back: Normal range of motion and neck supple.  Skin:    General: Skin is warm and dry.     Findings: No rash.  Neurological:     General: No focal deficit present.     Mental Status: She is alert and oriented to person, place, and time.     Cranial Nerves: No cranial nerve deficit.     Sensory: No sensory deficit.     Motor: No weakness or abnormal muscle tone.     Coordination: Coordination normal.     Comments: Pupils reactive. No facial asymmetry noted. Cranial nerves appear grossly intact. Sensation intact to light touch on face, BUE and BLE. Strength 5/5 in BUE and BLE.     ED Results / Procedures / Treatments   Labs (all labs ordered are listed, but only abnormal results are displayed) Labs Reviewed  I-STAT BETA HCG BLOOD, ED ([facility], [facility], [facility] ONLY)    EKG None  Radiology No results found.  Procedures Procedures (including critical care time)  Medications Ordered in ED Medications  sodium chloride 0.9 % bolus 1,000 mL  (0 mLs Intravenous Stopped 06/07/19 2304)  metoCLOPramide (REGLAN) injection 10 mg (10 mg Intravenous Given 06/07/19 2212)  diphenhydrAMINE (BENADRYL) injection 25 mg (25 mg Intravenous Given 06/07/19 2212)    ED Course  I have reviewed the triage vital signs and the nursing notes.  Pertinent labs & imaging results that were available during my care of the patient were reviewed by me and considered in my medical decision making (see chart for details).    MDM Rules/Calculators/A&P                      23 year old female with a past medical history of migraines presenting to the ED with a chief complaint of migraine.  Symptoms began 6 hours ago and not improved with baclofen.  States that this feels typical of her usual migraines with associated photophobia and nausea.  On exam she is overall well-appearing.  No deficits neurological exam noted.  No meningeal signs.  She is afebrile here.  Other vital signs within normal limits.  hCG is negative.  She was given IV fluids, migraine cocktail with significant improvement of her symptoms.  She is requesting discharge home. There are no headache characteristics that are lateralizing or concerning for increased ICP, infectious or vascular cause of her symptoms.  We will have her continue her home medications and return for worsening symptoms.  11:32 PM After discharge patient states that her headache has significantly improved but not resolved.  Told the patient that these medications will take time to gradually work but that it is a good sign that she is responding to the medications.  I will give her a dose of Tylenol and ibuprofen prior to discharge and patient is agreeable to this.  Patient is hemodynamically stable, in NAD, and able to ambulate in the ED. Evaluation does not show pathology that would require ongoing emergent intervention or inpatient treatment. I explained the diagnosis to the patient. Pain has been managed and has no complaints  prior to discharge. Patient is comfortable with above plan and is stable for discharge at this time. All questions were answered prior to disposition. Strict return precautions for returning to the ED were discussed. Encouraged follow up with PCP.   An After Visit Summary was printed and given to the patient.   Portions of this note were generated with Scientist, clinical (histocompatibility and immunogenetics). Dictation errors may occur despite best attempts at proofreading.  Final Clinical Impression(s) / ED Diagnoses Final diagnoses:  Other migraine without status migrainosus, not intractable    Rx / DC Orders ED Discharge Orders    None       Dietrich Pates, PA-C 06/07/19 2333    Pricilla Loveless, MD 06/11/19 [phone]

## 2019-06-07 NOTE — ED Triage Notes (Signed)
Pt coming in c/o migraine that started at 3pm. Took medication she was prescribed but not helping.

## 2019-06-07 NOTE — Discharge Instructions (Addendum)
Continue your home medications as previously prescribed. Return to the ED if you start to have worsening headache, numbness in arms or legs, vision changes or neck stiffness.

## 2019-06-10 ENCOUNTER — Telehealth: Payer: Self-pay

## 2019-06-10 ENCOUNTER — Encounter: Payer: Self-pay | Admitting: Family Medicine

## 2019-06-10 DIAGNOSIS — G8929 Other chronic pain: Secondary | ICD-10-CM

## 2019-06-10 DIAGNOSIS — M542 Cervicalgia: Secondary | ICD-10-CM

## 2019-06-10 DIAGNOSIS — M40202 Unspecified kyphosis, cervical region: Secondary | ICD-10-CM

## 2019-06-10 NOTE — Telephone Encounter (Signed)
Please advise 

## 2019-06-10 NOTE — Telephone Encounter (Signed)
Pt. Called requesting referral to physical therapy to work with her spine and neck from Dr. Pamella Pert.

## 2019-06-18 ENCOUNTER — Ambulatory Visit (INDEPENDENT_AMBULATORY_CARE_PROVIDER_SITE_OTHER): Payer: Commercial Managed Care - PPO

## 2019-06-18 DIAGNOSIS — J3089 Other allergic rhinitis: Secondary | ICD-10-CM

## 2019-06-26 ENCOUNTER — Ambulatory Visit (INDEPENDENT_AMBULATORY_CARE_PROVIDER_SITE_OTHER): Payer: Commercial Managed Care - PPO | Admitting: *Deleted

## 2019-06-26 DIAGNOSIS — J309 Allergic rhinitis, unspecified: Secondary | ICD-10-CM

## 2019-06-29 ENCOUNTER — Ambulatory Visit: Payer: Commercial Managed Care - PPO | Admitting: Family Medicine

## 2019-07-02 ENCOUNTER — Ambulatory Visit (INDEPENDENT_AMBULATORY_CARE_PROVIDER_SITE_OTHER): Payer: Commercial Managed Care - PPO

## 2019-07-02 DIAGNOSIS — J309 Allergic rhinitis, unspecified: Secondary | ICD-10-CM

## 2019-07-09 ENCOUNTER — Ambulatory Visit (INDEPENDENT_AMBULATORY_CARE_PROVIDER_SITE_OTHER): Payer: Commercial Managed Care - PPO

## 2019-07-09 DIAGNOSIS — J309 Allergic rhinitis, unspecified: Secondary | ICD-10-CM

## 2019-07-14 ENCOUNTER — Ambulatory Visit (INDEPENDENT_AMBULATORY_CARE_PROVIDER_SITE_OTHER): Payer: Commercial Managed Care - PPO

## 2019-07-14 DIAGNOSIS — J309 Allergic rhinitis, unspecified: Secondary | ICD-10-CM | POA: Diagnosis not present

## 2019-07-21 ENCOUNTER — Ambulatory Visit (INDEPENDENT_AMBULATORY_CARE_PROVIDER_SITE_OTHER): Payer: Commercial Managed Care - PPO

## 2019-07-21 DIAGNOSIS — J309 Allergic rhinitis, unspecified: Secondary | ICD-10-CM

## 2019-07-28 ENCOUNTER — Ambulatory Visit (INDEPENDENT_AMBULATORY_CARE_PROVIDER_SITE_OTHER): Payer: Commercial Managed Care - PPO

## 2019-07-28 DIAGNOSIS — J309 Allergic rhinitis, unspecified: Secondary | ICD-10-CM

## 2019-08-06 ENCOUNTER — Ambulatory Visit (INDEPENDENT_AMBULATORY_CARE_PROVIDER_SITE_OTHER): Payer: Commercial Managed Care - PPO

## 2019-08-06 DIAGNOSIS — J309 Allergic rhinitis, unspecified: Secondary | ICD-10-CM

## 2019-08-21 ENCOUNTER — Ambulatory Visit (INDEPENDENT_AMBULATORY_CARE_PROVIDER_SITE_OTHER): Payer: Commercial Managed Care - PPO

## 2019-08-21 DIAGNOSIS — J309 Allergic rhinitis, unspecified: Secondary | ICD-10-CM

## 2019-08-24 ENCOUNTER — Ambulatory Visit (INDEPENDENT_AMBULATORY_CARE_PROVIDER_SITE_OTHER): Payer: Commercial Managed Care - PPO

## 2019-08-24 DIAGNOSIS — J309 Allergic rhinitis, unspecified: Secondary | ICD-10-CM

## 2019-10-02 ENCOUNTER — Ambulatory Visit (INDEPENDENT_AMBULATORY_CARE_PROVIDER_SITE_OTHER): Payer: Commercial Managed Care - PPO

## 2019-10-02 ENCOUNTER — Telehealth: Payer: Self-pay

## 2019-10-02 DIAGNOSIS — J309 Allergic rhinitis, unspecified: Secondary | ICD-10-CM

## 2019-10-02 NOTE — Telephone Encounter (Signed)
Patient called and was wanting to come in for her allergy injections. Patient stated that she was getting injections for migraines and she also received her Covid vaccine. Patient last received an injection on 08/24/2019 and received 0.2 of her gold vials. Where would you like me to start her back at. Please advise.

## 2019-10-02 NOTE — Telephone Encounter (Signed)
If patient comes in on 4/23, give her blue 0.5 If she comes in the week of 4/26, give her blue 0.4

## 2019-10-02 NOTE — Telephone Encounter (Signed)
Unfortunately per Ashleigh patients blue vials were tossed out since patient reached 0.2 of her gold vials. I have spoke with Dr. Selena Batten letting her know that we could mixed down to her blue vial however she would have to start the whole vial over. I informed Dr. Selena Batten that we could give her 0.025 ml of her gold and Dr. Selena Batten agreed. Patient is coming in today.

## 2019-10-09 ENCOUNTER — Ambulatory Visit (INDEPENDENT_AMBULATORY_CARE_PROVIDER_SITE_OTHER): Payer: Commercial Managed Care - PPO

## 2019-10-09 DIAGNOSIS — J309 Allergic rhinitis, unspecified: Secondary | ICD-10-CM

## 2019-11-05 ENCOUNTER — Ambulatory Visit (INDEPENDENT_AMBULATORY_CARE_PROVIDER_SITE_OTHER): Payer: Commercial Managed Care - PPO

## 2019-11-05 DIAGNOSIS — J309 Allergic rhinitis, unspecified: Secondary | ICD-10-CM

## 2019-11-12 ENCOUNTER — Ambulatory Visit (INDEPENDENT_AMBULATORY_CARE_PROVIDER_SITE_OTHER): Payer: Commercial Managed Care - PPO

## 2019-11-12 DIAGNOSIS — J309 Allergic rhinitis, unspecified: Secondary | ICD-10-CM | POA: Diagnosis not present

## 2019-12-10 ENCOUNTER — Ambulatory Visit (INDEPENDENT_AMBULATORY_CARE_PROVIDER_SITE_OTHER): Payer: Commercial Managed Care - PPO

## 2019-12-10 DIAGNOSIS — J309 Allergic rhinitis, unspecified: Secondary | ICD-10-CM

## 2019-12-10 DIAGNOSIS — K5900 Constipation, unspecified: Secondary | ICD-10-CM | POA: Insufficient documentation

## 2019-12-17 ENCOUNTER — Ambulatory Visit (INDEPENDENT_AMBULATORY_CARE_PROVIDER_SITE_OTHER): Payer: Commercial Managed Care - PPO

## 2019-12-17 DIAGNOSIS — J309 Allergic rhinitis, unspecified: Secondary | ICD-10-CM

## 2019-12-23 ENCOUNTER — Ambulatory Visit (INDEPENDENT_AMBULATORY_CARE_PROVIDER_SITE_OTHER): Payer: Commercial Managed Care - PPO

## 2019-12-23 DIAGNOSIS — J309 Allergic rhinitis, unspecified: Secondary | ICD-10-CM | POA: Diagnosis not present

## 2019-12-31 ENCOUNTER — Ambulatory Visit (INDEPENDENT_AMBULATORY_CARE_PROVIDER_SITE_OTHER): Payer: Commercial Managed Care - PPO

## 2019-12-31 DIAGNOSIS — J309 Allergic rhinitis, unspecified: Secondary | ICD-10-CM

## 2020-01-14 ENCOUNTER — Ambulatory Visit (INDEPENDENT_AMBULATORY_CARE_PROVIDER_SITE_OTHER): Payer: Commercial Managed Care - PPO

## 2020-01-14 DIAGNOSIS — J309 Allergic rhinitis, unspecified: Secondary | ICD-10-CM

## 2020-01-27 ENCOUNTER — Ambulatory Visit (INDEPENDENT_AMBULATORY_CARE_PROVIDER_SITE_OTHER): Payer: Commercial Managed Care - PPO

## 2020-01-27 DIAGNOSIS — J309 Allergic rhinitis, unspecified: Secondary | ICD-10-CM

## 2020-01-28 IMAGING — MR MR HEAD WO/W CM
11 of 12 series · 42 of 48 positions shown · IV contrast (multihance)
Comparison: None.

CLINICAL DATA: Chronic headache, occipital origin

EXAM:
MRI HEAD WITHOUT AND WITH CONTRAST
TECHNIQUE: Multiplanar, multiecho pulse sequences of the brain and surrounding
structures were obtained without and with intravenous contrast.
CONTRAST:  11mL MULTIHANCE GADOBENATE DIMEGLUMINE 529 MG/ML IV SOLN

[Series 2: T1 · sagittal · 5.0mm · 0.45mm/px · 2 of 23 slices shown]
[im 1/23]
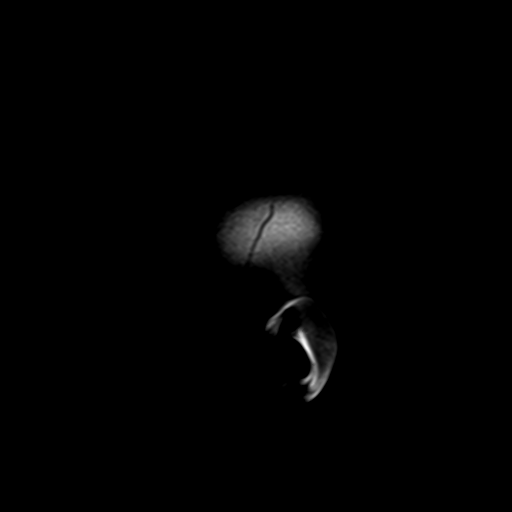
[im 23/23]
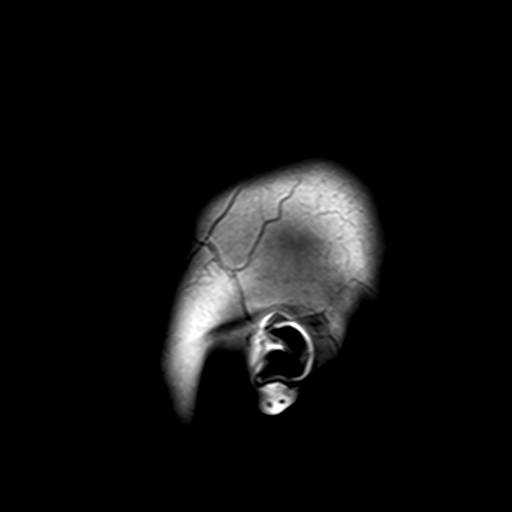

[Series 3: DWI · axial · 3.0mm · 1.80mm/px · z∈[-47,+99]mm · 6 of 100 slices shown (1 of 4)]
[im 1/100]
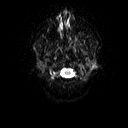
[im 20/100]
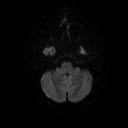
[im 40/100]
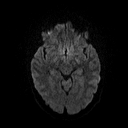
[im 60/100]
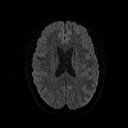
[im 80/100]
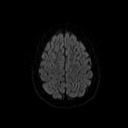
[im 100/100]
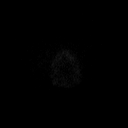

[Series 4: DWI · axial · 3.0mm · 1.80mm/px · z∈[-47,+99]mm · 3 of 49 slices shown (2 of 4)]
[im 1/49]
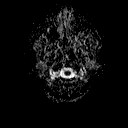
[im 25/49]
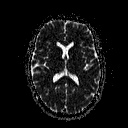
[im 49/49]
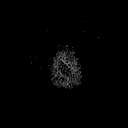

[Series 6: swi_images · axial · 2.0mm · 0.90mm/px · z∈[-52,+106]mm · 5 of 80 slices shown]
[im 1/80]
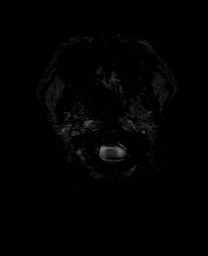
[im 20/80]
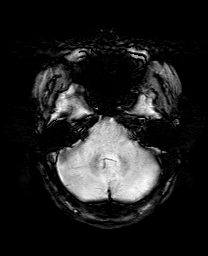
[im 40/80]
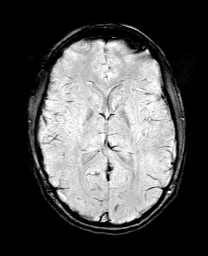
[im 60/80]
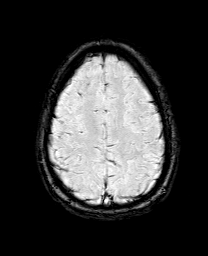
[im 80/80]
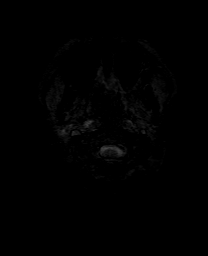

[Series 7: DWI · coronal · 5.0mm · 1.80mm/px · 5 of 72 slices shown (3 of 4)]
[im 1/72]
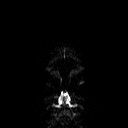
[im 18/72]
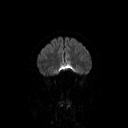
[im 36/72]
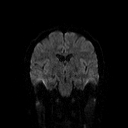
[im 54/72]
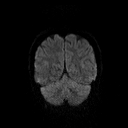
[im 72/72]
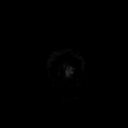

[Series 8: DWI · coronal · 5.0mm · 1.80mm/px · 2 of 36 slices shown (4 of 4)]
[im 1/36]
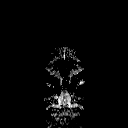
[im 36/36]
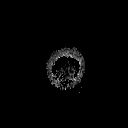

[Series 9: T2 · axial · 5.0mm · 0.60mm/px · 1 of 22 slices shown (1 of 2)]
[im 1/22]
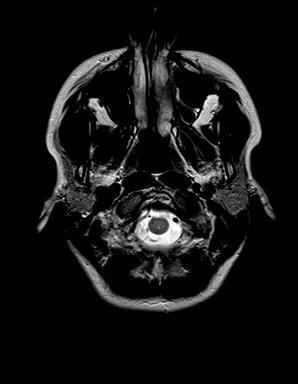

[Series 10: FLAIR · axial · 3.0mm · 0.45mm/px · z∈[-52,+104]mm · 2 of 27 slices shown]
[im 1/27]
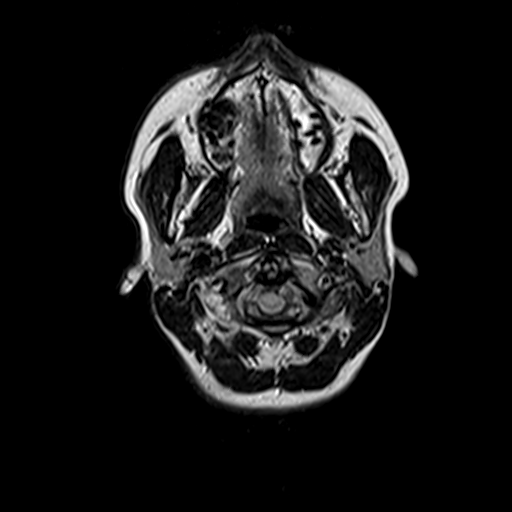
[im 27/27]
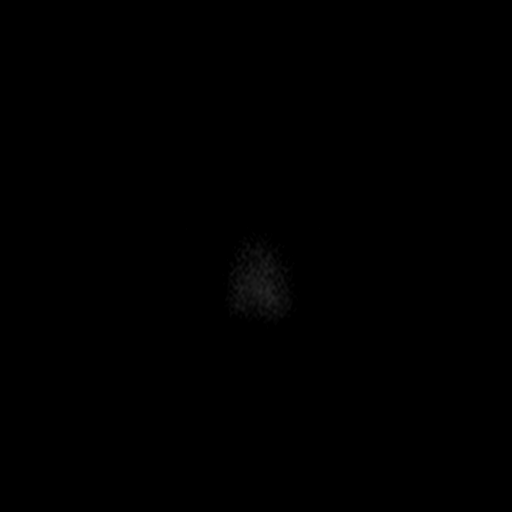

[Series 11: t1_mpr_tra copy center · axial · 1.0mm · 0.45mm/px · z∈[-44,+99]mm · 8 of 144 slices shown]
[im 1/144]
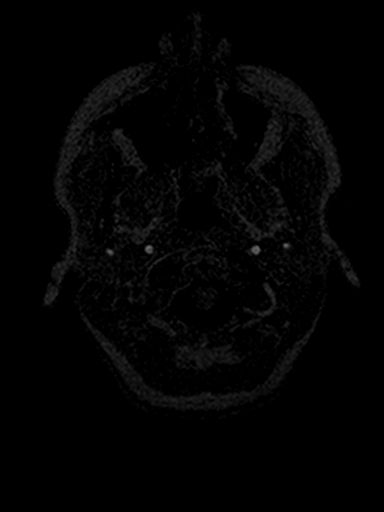
[im 18/144]
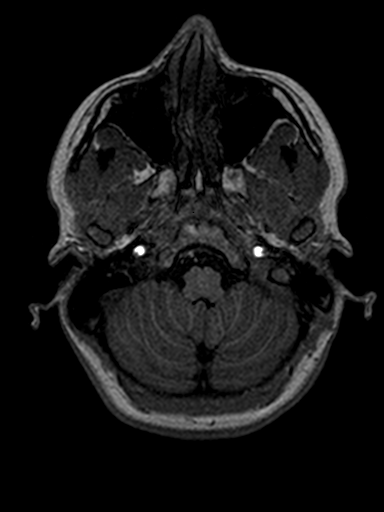
[im 36/144]
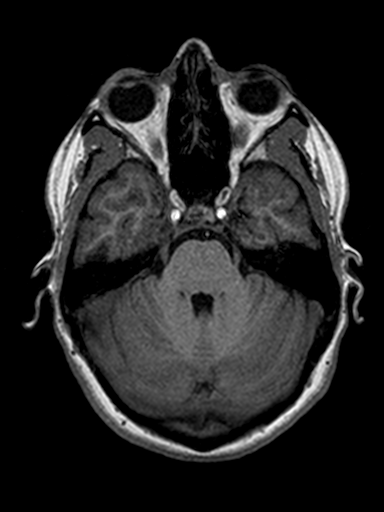
[im 54/144]
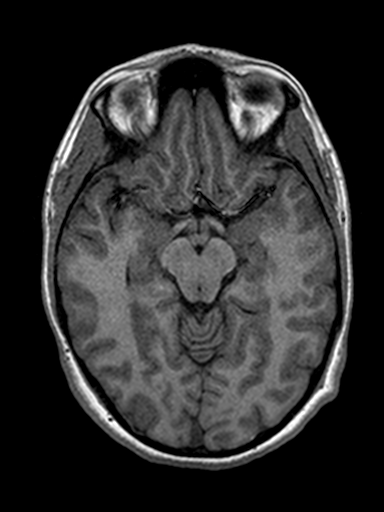
[im 90/144]
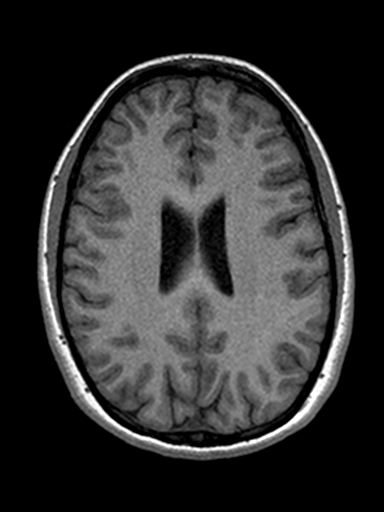
[im 108/144]
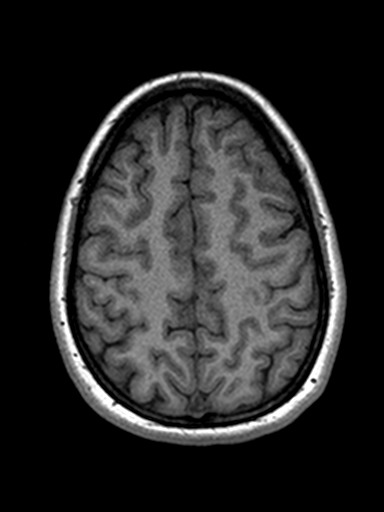
[im 126/144]
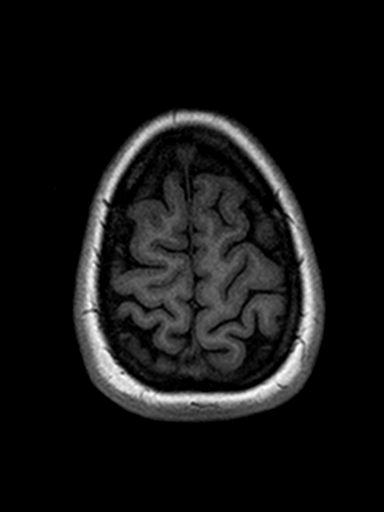
[im 144/144]
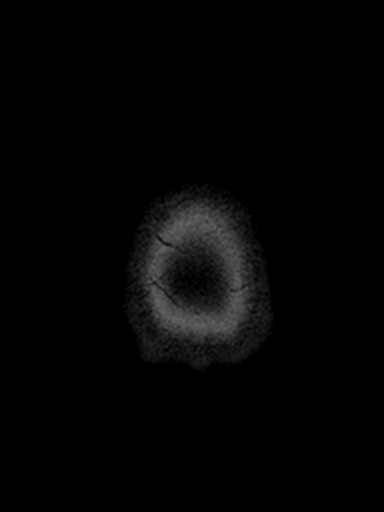

[Series 12: T2 · coronal · 5.0mm · 0.45mm/px · 2 of 31 slices shown (2 of 2)]
[im 1/31]
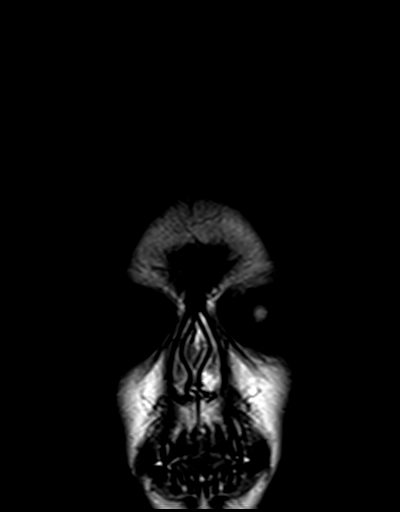
[im 31/31]
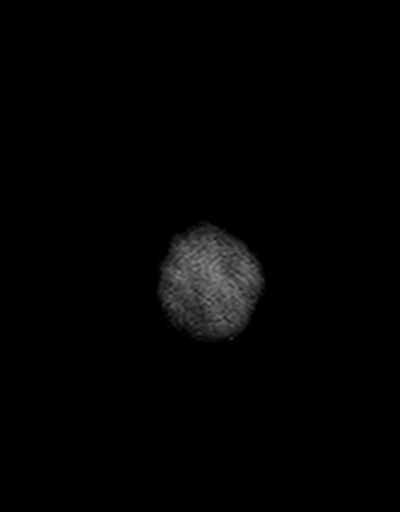

[Series 13: t1_mpr_tra · axial · 1.0mm · 0.45mm/px · z∈[-44,+63]mm · 6 of 144 slices shown]
[im 1/144]
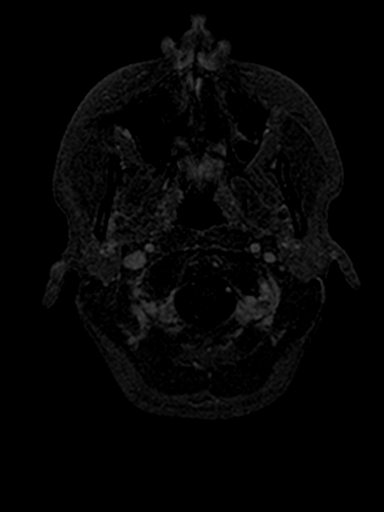
[im 18/144]
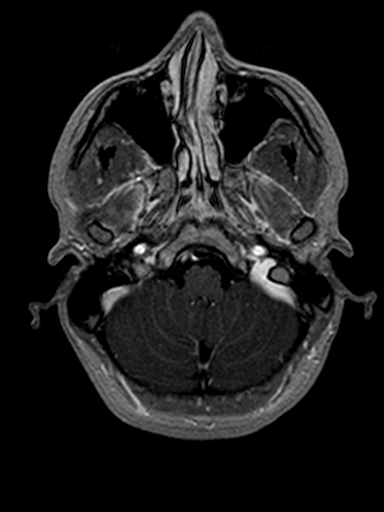
[im 36/144]
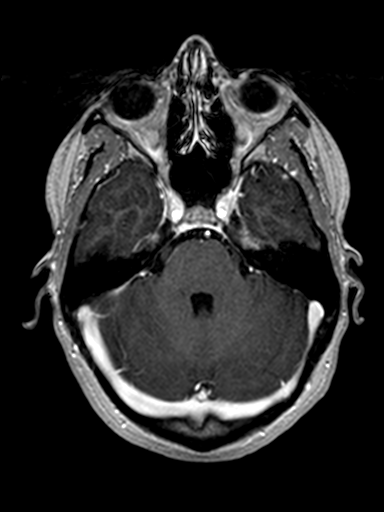
[im 54/144]
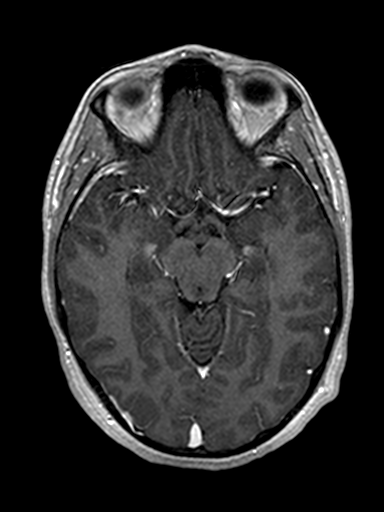
[im 90/144]
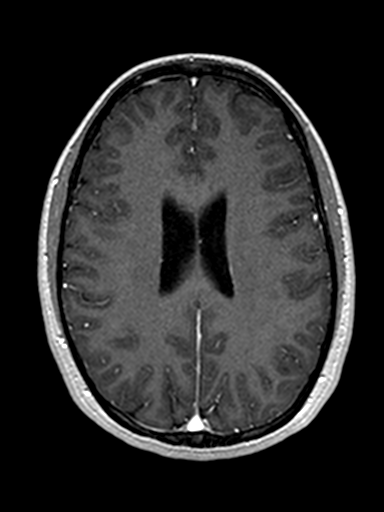
[im 108/144]
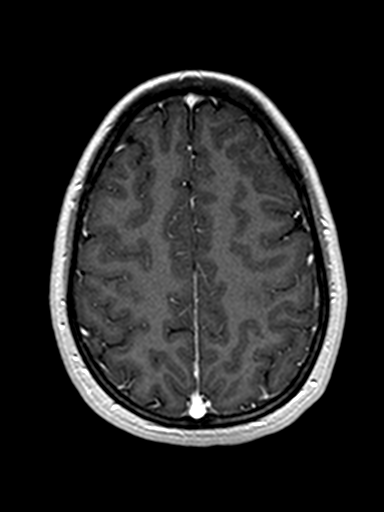

[42 of 48 positions shown; findings below may reference images not displayed]

FINDINGS: Brain: There is no acute infarction or intracranial hemorrhage.
There is no intracranial mass, mass effect, or edema. There is no
hydrocephalus or extra-axial fluid collection. Posterior fossa
including craniocervical junction is unremarkable. No abnormal
enhancement.

Vascular: Major vessel flow voids at the skull base are preserved.

Skull and upper cervical spine: Normal marrow signal is preserved.

Sinuses/Orbits: Paranasal sinuses are aerated. Orbits are
unremarkable.

Other: Sella is unremarkable.  Mastoid air cells are clear.
IMPRESSION: Normal MRI of the brain.

## 2020-02-01 ENCOUNTER — Ambulatory Visit: Payer: Commercial Managed Care - PPO | Admitting: Allergy

## 2020-02-09 ENCOUNTER — Ambulatory Visit: Payer: Commercial Managed Care - PPO | Admitting: Family Medicine

## 2020-02-09 ENCOUNTER — Encounter: Payer: Self-pay | Admitting: Family Medicine

## 2020-02-09 ENCOUNTER — Other Ambulatory Visit (HOSPITAL_COMMUNITY)
Admission: RE | Admit: 2020-02-09 | Discharge: 2020-02-09 | Disposition: A | Discharge status: Home / Self Care | Payer: Commercial Managed Care - PPO | Source: Ambulatory Visit | Attending: Family Medicine | Admitting: Family Medicine

## 2020-02-09 ENCOUNTER — Other Ambulatory Visit: Payer: Self-pay

## 2020-02-09 VITALS — BP 109/75 | HR 92 | Temp 98.1°F | Resp 14 | Ht 65.0 in | Wt 117.8 lb

## 2020-02-09 DIAGNOSIS — Z23 Encounter for immunization: Secondary | ICD-10-CM | POA: Diagnosis not present

## 2020-02-09 DIAGNOSIS — Z113 Encounter for screening for infections with a predominantly sexual mode of transmission: Secondary | ICD-10-CM | POA: Insufficient documentation

## 2020-02-09 DIAGNOSIS — K219 Gastro-esophageal reflux disease without esophagitis: Secondary | ICD-10-CM

## 2020-02-09 DIAGNOSIS — R002 Palpitations: Secondary | ICD-10-CM | POA: Diagnosis not present

## 2020-02-09 DIAGNOSIS — R300 Dysuria: Secondary | ICD-10-CM

## 2020-02-09 MED ORDER — FAMOTIDINE 20 MG PO TABS: 20.0000 mg | ORAL_TABLET | Freq: Two times a day (BID) | ORAL | 1 refills | Status: DC | PRN

## 2020-02-09 NOTE — Progress Notes (Signed)
8/31/20219:42 AM  Judy [name] 28-Jun-1995, 24 y.o., female [id number]  Chief Complaint  Patient presents with  . Migraine    treatment for migraines has changed   . Tachycardia    pt notes she has had increased heart rate possibly as a side effect or new migraine medication  . Gastroesophageal Reflux    pt has been struggling with reflux, reports painful throat with burning during swollowing   . Urinary Tract Infection    pt has been getting more frequent UTI over the past year, pt notes it has happened after intercourse, noted she should hydrate and ensure she is urinating following any intercourse. pt is curious about other preventative measures     HPI:   Patient is a 24 y.o. female with past medical history significant for migraines, asthma, gerd, seasonal allergies (immunotherapy) who presents today with several concerns  Sees Dr Neale Burly for her migraine Taking imipramine and naratriptan She has had palpitations in the past however they have become more prominent, highest HR < 120 She felt mildlyy lightheaded and "heart felt tired" She did not feel sweaty, SOB, nausea  This has been happening at rest, when she lies down EMS came out and were not concerned  No issues with exercise (mostly yoga) Has never had cardiac eval  Asthma has been doing well She is doing immunotherapy for her allergies She sees asthma and allergy center  Reflux is getting worse Used to get reflux only with certain foods For past month has been bad (3-4 days of almost vomiting, acid brash, lump in throat) She noticed recent bad episodes preceded by drinking 2-3 drinks of etoh, drinks 2-3 cups of coffee, no nsaids, no spicy foods Took pepcid 10mg , gaviscon, tums  Has been having increase intermittent dysuria with suprapubic pressure for past year Has been treated 3 times for UTI via virtual visits Urinates after sexual intercourse No vaginal discharge or pelvic pain Having issues with  constipation Not having any dysuria today She is requesting STD testing today  Depression screen [facility] 2/9 02/09/2020 04/16/2019 12/25/2018  Decreased Interest 0 0 0  Down, Depressed, Hopeless 0 1 0  PHQ - 2 Score 0 1 0    Fall Risk  02/09/2020 04/16/2019 12/25/2018 09/25/2018 08/08/2018  Falls in the past year? 0 0 0 0 0  Number falls in past yr: 0 0 0 0 -  Injury with Fall? 0 0 0 0 -  Risk for fall due to : No Fall Risks - - - -  Follow up Falls evaluation completed - - Falls evaluation completed Falls evaluation completed     Allergies  Allergen Reactions  . Pollen Extract Other (See Comments)    Sneezing/itchy eyes/runny nose    Prior to Admission medications   Medication Sig Start Date End Date Taking? Authorizing Provider  albuterol (VENTOLIN HFA) 108 (90 Base) MCG/ACT inhaler Inhale 2 puffs into the lungs every 6 (six) hours as needed for wheezing (One for school and one for home). 12/11/18  Yes 02/11/19, DO  cetirizine (ZYRTEC) 10 MG tablet Take 1 tablet (10 mg total) by mouth daily as needed (for allergies.). 09/25/18  Yes 09/27/18, MD  EPINEPHrine 0.3 mg/0.3 mL IJ SOAJ injection Inject 0.3 mLs (0.3 mg total) into the muscle once as needed for anaphylaxis. 05/26/19  Yes Kozlow, 05/28/19, MD  famotidine (PEPCID AC) 10 MG chewable tablet Chew 10 mg by mouth daily as needed for heartburn.   Yes [provider]  fluticasone (FLONASE) 50 MCG/ACT nasal spray Place 1 spray into both nostrils 2 (two) times daily. 12/11/18  Yes Ellamae Sia, DO  imipramine (TOFRANIL) 25 MG tablet Take 50 mg by mouth daily. 01/15/20  Yes [provider]  Naphazoline-Pheniramine (OPCON-A) 0.027-0.315 % SOLN Place 1 drop into both eyes 3 (three) times daily as needed (for irritated/itchy eyes.).   Yes [provider]  naratriptan (AMERGE) 2.5 MG tablet Take 1 mg by mouth as needed. 01/22/20  Yes [provider]  QVAR REDIHALER 80 MCG/ACT inhaler 1 puff 2 (two) times daily.  04/07/19  Yes [provider]    Past Medical History:  Diagnosis Date  . Acne    tx with spironolactone  . Allergy    Has allergies to dogs and cats.  . Anxiety    no meds  . Asthma    as a child, rarely uses inhaler  . Cat allergies 04/20/2013  . Depression    no meds  . GERD (gastroesophageal reflux disease)    occasional  . Migraine headache with aura    last one 04/2017, triggers lack of sleep-otc prn    Past Surgical History:  Procedure Laterality Date  . LESION REMOVAL N/A 05/28/2017   Procedure: PLASTICS REPAIR OF INTROIDUS;  Surgeon: Romualdo Bolk, MD;  Location: [facility];  Service: Gynecology;  Laterality: N/A;  . WISDOM TOOTH EXTRACTION      Social History   Tobacco Use  . Smoking status: Never Smoker  . Smokeless tobacco: Never Used  Substance Use Topics  . Alcohol use: No    Family History  Problem Relation Age of Onset  . Hypertension Mother   . Hyperlipidemia Mother   . Scoliosis Mother   . Lupus Maternal Grandmother   . Osteoporosis Maternal Grandmother   . Heart disease Maternal Grandfather   . Hypertension Maternal Grandfather   . Diabetes Maternal Grandfather   . Emphysema Maternal Grandfather   . Heart attack Maternal Grandfather        occurred < 77 years old  . Hyperlipidemia Maternal Grandfather   . COPD Maternal Grandfather   . Cancer Maternal Grandfather        skin cancer  . Osteoarthritis Paternal Grandmother   . Diabetes Paternal Grandfather   . Heart disease Paternal Grandfather   . Kidney failure Paternal Grandfather        2 transplants due to DM  . Hypertension Paternal Grandfather   . Hyperlipidemia Paternal Grandfather   . Stroke Paternal Grandfather   . Parkinson's disease Paternal Grandfather     ROS Per hpi  OBJECTIVE:  Today's Vitals   02/09/20 0926  BP: 109/75  Pulse: 92  Resp: 14  Temp: 98.1 F (36.7 C)  TempSrc: Temporal  SpO2: 99%  Weight: 117 lb 12.8 oz (53.4 kg)  Height: 5\' 5"   (1.651 m)   Body mass index is 19.6 kg/m.   Physical Exam Vitals and nursing note reviewed.  Constitutional:      Appearance: She is well-developed.  HENT:     Head: Normocephalic and atraumatic.  Eyes:     General: No scleral icterus.    Conjunctiva/sclera: Conjunctivae normal.     Pupils: Pupils are equal, round, and reactive to light.  Pulmonary:     Effort: Pulmonary effort is normal.  Musculoskeletal:     Cervical back: Neck supple.  Skin:    General: Skin is warm and dry.  Neurological:     Mental  Status: She is alert and oriented to person, place, and time.     No results found for this or any previous visit (from the past 24 hour(s)).  No results found.   ASSESSMENT and PLAN  1. Gastroesophageal reflux disease without esophagitis Discussed dietary changes. Trial of higher dose pepcid for flare ups. RTC precautions reviewed  2. Dysuria Discussed not all dysuria is UTI, needs urine test for confirmation, discussed supportive measures.   3. Palpitations Recent EMS eval that was reassuring. In setting of recent addition of TCA. Advised discussed med change with rx physician. If persist pursue further workup. RTC precautions given.   4. Screening examination for STD (sexually transmitted disease) - Urine cytology ancillary only  Other orders - imipramine (TOFRANIL) 25 MG tablet; Take 50 mg by mouth daily. - naratriptan (AMERGE) 2.5 MG tablet; Take 1 mg by mouth as needed. - Tdap vaccine greater than or equal to 7yo IM - Flu Vaccine QUAD 6+ mos PF IM (Fluarix Quad PF) - famotidine (PEPCID) 20 MG tablet; Take 1 tablet (20 mg total) by mouth 2 (two) times daily as needed for heartburn or indigestion.  No follow-ups on file.    Myles Lipps, MD Primary Care at [facility] [address] [place], [place] [address] Ph.  [phone] Fax [phone]

## 2020-02-09 NOTE — Patient Instructions (Addendum)
If you have lab work done today you will be contacted with your lab results within the next 2 weeks.  If you have not heard from [place] then please contact us. The fastest way to get your results is to register for My Chart.   IF you received an x-ray today, you will receive an invoice from [facility]. Please contact [facility] at [phone] with questions or concerns regarding your invoice.   IF you received labwork today, you will receive an invoice from [facility]. Please contact [facility] at [phone] with questions or concerns regarding your invoice.   Our billing staff will not be able to assist you with questions regarding bills from these companies.  You will be contacted with the lab results as soon as they are available. The fastest way to get your results is to activate your My Chart account. Instructions are located on the last page of this paperwork. If you have not heard from [place] regarding the results in 2 weeks, please contact this office.     Dysuria Dysuria is pain or discomfort while urinating. The pain or discomfort may be felt in the part of your body that drains urine from the bladder (urethra) or in the surrounding tissue of the genitals. The pain may also be felt in the groin area, lower abdomen, or lower back. You may have to urinate frequently or have the sudden feeling that you have to urinate (urgency). Dysuria can affect both men and women, but it is more common in women. Dysuria can be caused by many different things, including:  Urinary tract infection.  Kidney stones or bladder stones.  Certain sexually transmitted infections (STIs), such as chlamydia.  Dehydration.  Inflammation of the tissues of the vagina.  Use of certain medicines.  Use of certain soaps or scented products that cause irritation. Follow these instructions at home: General instructions  Watch your condition for any changes.  Urinate often. Avoid holding  urine for long periods of time.  After a bowel movement or urination, women should cleanse from front to back, using each tissue only once.  Urinate after sexual intercourse.  Keep all follow-up visits as told by your health care provider. This is important.  If you had any tests done to find the cause of dysuria, it is up to you to get your test results. Ask your health care provider, or the department that is doing the test, when your results will be ready. Eating and drinking   Drink enough fluid to keep your urine pale yellow.  Avoid caffeine, tea, and alcohol. They can irritate the bladder and make dysuria worse. In men, alcohol may irritate the prostate. Medicines  Take over-the-counter and prescription medicines only as told by your health care provider.  If you were prescribed an antibiotic medicine, take it as told by your health care provider. Do not stop taking the antibiotic even if you start to feel better. Contact a health care provider if:  You have a fever.  You develop pain in your back or sides.  You have nausea or vomiting.  You have blood in your urine.  You are not urinating as often as you usually do. Get help right away if:  Your pain is severe and not relieved with medicines.  You cannot eat or drink without vomiting.  You are confused.  You have a rapid heartbeat while at rest.  You have shaking or chills.  You feel extremely weak. Summary  Dysuria  is pain or discomfort while urinating. Many different conditions can lead to dysuria.  If you have dysuria, you may have to urinate frequently or have the sudden feeling that you have to urinate (urgency).  Watch your condition for any changes. Keep all follow-up visits as told by your health care provider.  Make sure that you urinate often and drink enough fluid to keep your urine pale yellow. This information is not intended to replace advice given to you by your health care provider. Make  sure you discuss any questions you have with your health care provider. Document Revised: 05/10/2017 Document Reviewed: 03/14/2017 Elsevier Patient Education  2020 [employer].  Food Choices for Gastroesophageal Reflux Disease, Adult When you have gastroesophageal reflux disease (GERD), the foods you eat and your eating habits are very important. Choosing the right foods can help ease your discomfort. Think about working with a nutrition specialist (dietitian) to help you make good choices. What are tips for following this plan?  Meals  Choose healthy foods that are low in fat, such as fruits, vegetables, whole grains, low-fat dairy products, and lean meat, fish, and poultry.  Eat small meals often instead of 3 large meals a day. Eat your meals slowly, and in a place where you are relaxed. Avoid bending over or lying down until 2-3 hours after eating.  Avoid eating meals 2-3 hours before bed.  Avoid drinking a lot of liquid with meals.  Cook foods using methods other than frying. Bake, grill, or broil food instead.  Avoid or limit: ? Chocolate. ? Peppermint or spearmint. ? Alcohol. ? Pepper. ? Black and decaffeinated coffee. ? Black and decaffeinated tea. ? Bubbly (carbonated) soft drinks. ? Caffeinated energy drinks and soft drinks.  Limit high-fat foods such as: ? Fatty meat or fried foods. ? Whole milk, cream, butter, or ice cream. ? Nuts and nut butters. ? Pastries, donuts, and sweets made with butter or shortening.  Avoid foods that cause symptoms. These foods may be different for everyone. Common foods that cause symptoms include: ? Tomatoes. ? Oranges, lemons, and limes. ? Peppers. ? Spicy food. ? Onions and garlic. ? Vinegar. Lifestyle  Maintain a healthy weight. Ask your doctor what weight is healthy for you. If you need to lose weight, work with your doctor to do so safely.  Exercise for at least 30 minutes for 5 or more days each week, or as told by your  doctor.  Wear loose-fitting clothes.  Do not smoke. If you need help quitting, ask your doctor.  Sleep with the head of your bed higher than your feet. Use a wedge under the mattress or blocks under the bed frame to raise the head of the bed. Summary  When you have gastroesophageal reflux disease (GERD), food and lifestyle choices are very important in easing your symptoms.  Eat small meals often instead of 3 large meals a day. Eat your meals slowly, and in a place where you are relaxed.  Limit high-fat foods such as fatty meat or fried foods.  Avoid bending over or lying down until 2-3 hours after eating.  Avoid peppermint and spearmint, caffeine, alcohol, and chocolate. This information is not intended to replace advice given to you by your health care provider. Make sure you discuss any questions you have with your health care provider. Document Revised: 09/18/2018 Document Reviewed: 07/03/2016 Elsevier Patient Education  2020 [employer].

## 2020-02-10 LAB — URINE CYTOLOGY ANCILLARY ONLY
Chlamydia: NEGATIVE
Comment: NEGATIVE
Comment: NEGATIVE
Comment: NORMAL
Neisseria Gonorrhea: NEGATIVE
Trichomonas: NEGATIVE

## 2020-02-16 NOTE — Progress Notes (Signed)
Follow Up Note  RE: Judy [name] MRN: [id number] DOB: 03-Sep-1995 Date of Office Visit: 02/17/2020  Referring provider: Myles Lipps, MD Primary care provider: Myles Lipps, MD  Chief Complaint: Follow-up  History of Present Illness: I had the pleasure of seeing Judy [name] for a follow up visit at the [facility] on 02/17/2020. She is a 24 y.o. female, who is being followed for asthma and allergic rhinoconjunctivitis on AIT. Her previous allergy office visit was on 12/11/2018 with Dr. Selena Batten. Today is a regular follow up visit. Up to date with COVID-19 vaccine: yes  Mild persistent asthma Noticed increased shortness of breath episodes even at rest and at the beach. Symptoms resolve within 15 minutes after taking albuterol. This occurs about 2-3 times per month.   Denies any coughing, wheezing, chest tightness, nocturnal awakenings, ER/urgent care visits or prednisone use since the last visit.  Currently on Qvar 1 puff twice a day.  Other allergic rhinitis Doing allergy injections with some benefit. Patient had some localized reactions with one episode of trouble breathing. Currently on zyrtec daily. Not using eye drops or Flonase on a regular basis.   Assessment and Plan: [name] is a 24 y.o. female with: Mild persistent asthma without complication Past history - Diagnosed with asthma over 10 years ago however the last few months noticed increasing symptoms.  She was restarted on Qvar 80 1 puff twice a day with good benefit.  Using albuterol once a week.  Triggers include allergies and exertion. Interim history - 2-3 episodes of using albuterol per month with good benefit. No specific triggers noted.   Today's spirometry was normal. ACT score 22. . Daily controller medication(s): Increase Qvar to 2 puffs twice a day and rinse mouth afterwards.  o If this does not improve your shortness of breath episodes after 1-2 months then let us know.  . Prior to  physical activity: May use albuterol rescue inhaler 2 puffs 5 to 15 minutes prior to strenuous physical activities. Marland Kitchen Rescue medications: May use albuterol rescue inhaler 2 puffs or nebulizer every 4 to 6 hours as needed for shortness of breath, chest tightness, coughing, and wheezing. Monitor frequency of use.  . Repeat spirometry at next visit.   Seasonal and perennial allergic rhinoconjunctivitis Past history - Perennial rhinoconjunctivitis symptoms for the past 10 years with worsening in the spring.  2020 skin testing showed: Positive to grass, weed, ragweed, tree, dust mites, cat, dog, mold. Interim history - Started AIT on 05/26/2019 (M-DM-C-D & G-RW-W-T). Doing well with below regimen. One episode of shortness of breath after allergy injections.  Continue allergy injections.   If you notice shortness of breath episodes then let us know.  You may take zyrtec 10mg  twice a day on the days of your injections.   Continue environmental control measures as below.   May use over the counter antihistamines such as Zyrtec (cetirizine), Claritin (loratadine), Allegra (fexofenadine), or Xyzal (levocetirizine) daily as needed.  May use Flonase 1 spray in each nostril 1-2 times a day as needed.  May use Opcon A eye drops as needed.   Return in about 4 months (around 06/18/2020).  Meds ordered this encounter  Medications  . albuterol (VENTOLIN HFA) 108 (90 Base) MCG/ACT inhaler    Sig: Inhale 2 puffs into the lungs every 4 (four) hours as needed for wheezing or shortness of breath.    Dispense:  18 g    Refill:  1  .  QVAR REDIHALER 80 MCG/ACT inhaler    Sig: Inhale 2 puffs into the lungs 2 (two) times daily. Rinse mouth after each use.    Dispense:  1 each    Refill:  5   Diagnostics: Spirometry:  Tracings reviewed. Her effort: Good reproducible efforts. FVC: 4.70L FEV1: 3.89L, 113% predicted FEV1/FVC ratio: 83% Interpretation: Spirometry consistent with normal pattern.  Please see  scanned spirometry results for details.  Medication List:  Current Outpatient Medications  Medication Sig Dispense Refill  . albuterol (VENTOLIN HFA) 108 (90 Base) MCG/ACT inhaler Inhale 2 puffs into the lungs every 4 (four) hours as needed for wheezing or shortness of breath. 18 g 1  . cetirizine (ZYRTEC) 10 MG tablet Take 1 tablet (10 mg total) by mouth daily as needed (for allergies.). 30 tablet 2  . EPINEPHrine 0.3 mg/0.3 mL IJ SOAJ injection Inject 0.3 mLs (0.3 mg total) into the muscle once as needed for anaphylaxis. 2 each 1  . famotidine (PEPCID) 20 MG tablet Take 1 tablet (20 mg total) by mouth 2 (two) times daily as needed for heartburn or indigestion. 60 tablet 1  . imipramine (TOFRANIL) 25 MG tablet Take 50 mg by mouth daily.    . Naphazoline-Pheniramine (OPCON-A) 0.027-0.315 % SOLN Place 1 drop into both eyes 3 (three) times daily as needed (for irritated/itchy eyes.).    Marland Kitchen naratriptan (AMERGE) 2.5 MG tablet Take 1 mg by mouth as needed.    Marland Kitchen QVAR REDIHALER 80 MCG/ACT inhaler Inhale 2 puffs into the lungs 2 (two) times daily. Rinse mouth after each use. 1 each 5   No current facility-administered medications for this visit.   Allergies: Allergies  Allergen Reactions  . Pollen Extract Other (See Comments)    Sneezing/itchy eyes/runny nose   I reviewed her past medical history, social history, family history, and environmental history and no significant changes have been reported from her previous visit.  Review of Systems  Constitutional: Negative for appetite change, chills, fever and unexpected weight change.  HENT: Negative for congestion and rhinorrhea.   Eyes: Negative for itching.  Respiratory: Positive for shortness of breath. Negative for cough, chest tightness and wheezing.   Cardiovascular: Negative for chest pain.  Gastrointestinal: Negative for abdominal pain.  Genitourinary: Negative for difficulty urinating.  Skin: Negative for rash.  Allergic/Immunologic:  Positive for environmental allergies. Negative for food allergies.  Neurological: Positive for headaches.   Objective: BP 118/72 (BP Location: Right Arm, Patient Position: Sitting, Cuff Size: Normal)   Pulse 93   Temp (!) 97.1 F (36.2 C) (Temporal)   Resp 18   Ht 5\' 5"  (1.651 m)   Wt 120 lb 3.2 oz (54.5 kg)   SpO2 100%   BMI 20.00 kg/m  Body mass index is 20 kg/m. Physical Exam Vitals and nursing note reviewed.  Constitutional:      Appearance: Normal appearance. She is well-developed.  HENT:     Head: Normocephalic and atraumatic.     Right Ear: External ear normal.     Left Ear: External ear normal.     Nose: Nose normal.     Mouth/Throat:     Mouth: Mucous membranes are moist.     Pharynx: Oropharynx is clear.  Eyes:     Conjunctiva/sclera: Conjunctivae normal.  Cardiovascular:     Rate and Rhythm: Normal rate and regular rhythm.     Heart sounds: Normal heart sounds. No murmur heard.  No friction rub. No gallop.   Pulmonary:  Effort: Pulmonary effort is normal.     Breath sounds: Normal breath sounds. No wheezing or rales.  Musculoskeletal:     Cervical back: Neck supple.  Skin:    General: Skin is warm.     Findings: No rash.  Neurological:     Mental Status: She is alert and oriented to person, place, and time.  Psychiatric:        Behavior: Behavior normal.    Previous notes and tests were reviewed. The plan was reviewed with the patient/family, and all questions/concerned were addressed.  It was my pleasure to see Tashia today and participate in her care. Please feel free to contact me with any questions or concerns.  Sincerely,  Wyline Mood, DO Allergy & Immunology  [facility] office: [phone] [facility] office: [phone] [place] office: [phone]

## 2020-02-17 ENCOUNTER — Other Ambulatory Visit: Payer: Self-pay

## 2020-02-17 ENCOUNTER — Encounter: Payer: Self-pay | Admitting: Allergy

## 2020-02-17 ENCOUNTER — Ambulatory Visit: Payer: Commercial Managed Care - PPO | Admitting: Allergy

## 2020-02-17 VITALS — BP 118/72 | HR 93 | Temp 97.1°F | Resp 18 | Ht 65.0 in | Wt 120.2 lb

## 2020-02-17 DIAGNOSIS — J4530 Mild persistent asthma, uncomplicated: Secondary | ICD-10-CM | POA: Diagnosis not present

## 2020-02-17 DIAGNOSIS — J3089 Other allergic rhinitis: Secondary | ICD-10-CM

## 2020-02-17 DIAGNOSIS — J302 Other seasonal allergic rhinitis: Secondary | ICD-10-CM

## 2020-02-17 DIAGNOSIS — H1010 Acute atopic conjunctivitis, unspecified eye: Secondary | ICD-10-CM | POA: Diagnosis not present

## 2020-02-17 MED ORDER — ALBUTEROL SULFATE HFA 108 (90 BASE) MCG/ACT IN AERS: 2.0000 | INHALATION_SPRAY | RESPIRATORY_TRACT | 1 refills | Status: DC | PRN

## 2020-02-17 MED ORDER — QVAR REDIHALER 80 MCG/ACT IN AERB
2.0000 | INHALATION_SPRAY | Freq: Two times a day (BID) | RESPIRATORY_TRACT | 5 refills | Status: DC
Start: 2020-02-17 — End: 2020-08-19

## 2020-02-17 NOTE — Assessment & Plan Note (Addendum)
Past history - Perennial rhinoconjunctivitis symptoms for the past 10 years with worsening in the spring.  2020 skin testing showed: Positive to grass, weed, ragweed, tree, dust mites, cat, dog, mold. Interim history - Started AIT on 05/26/2019 (M-DM-C-D & G-RW-W-T). Doing well with below regimen. One episode of shortness of breath after allergy injections.  Continue allergy injections.   If you notice shortness of breath episodes then let us know.  You may take zyrtec 10mg  twice a day on the days of your injections.   Continue environmental control measures as below.   May use over the counter antihistamines such as Zyrtec (cetirizine), Claritin (loratadine), Allegra (fexofenadine), or Xyzal (levocetirizine) daily as needed.  May use Flonase 1 spray in each nostril 1-2 times a day as needed.  May use Opcon A eye drops as needed.

## 2020-02-17 NOTE — Patient Instructions (Addendum)
Environmental allergies:   Past skin testing showed: Positive to grass, weed, ragweed, tree, dust mites, cat, dog, mold  Continue allergy injections.   If you notice shortness of breath episodes then let us know.  You may take zyrtec 10mg  twice a day on the days of your injections.   Continue environmental control measures as below.   May use over the counter antihistamines such as Zyrtec (cetirizine), Claritin (loratadine), Allegra (fexofenadine), or Xyzal (levocetirizine) daily as needed.  May use Flonase 1 spray in each nostril 1-2 times a day as needed.  May use Opcon A eye drops as needed.   Asthma: . Daily controller medication(s): Increase Qvar 80 to 2 puffs twice a day and rinse mouth afterwards.  o If this does not improve your shortness of breath episodes after 1-2 months then let know.  . Prior to physical activity: May use albuterol rescue inhaler 2 puffs 5 to 15 minutes prior to strenuous physical activities. [place] Rescue medications: May use albuterol rescue inhaler 2 puffs or nebulizer every 4 to 6 hours as needed for shortness of breath, chest tightness, coughing, and wheezing. Monitor frequency of use.  . Asthma control goals:  o Full participation in all desired activities (may need albuterol before activity) o Albuterol use two times or less a week on average (not counting use with activity) o Cough interfering with sleep two times or less a month o Oral steroids no more than once a year o No hospitalizations  Follow up in 4 months or sooner if needed.   Reducing Pollen Exposure . Pollen seasons: trees (spring), grass (summer) and ragweed/weeds (fall). 10-18-1978 Keep windows closed in your home and car to lower pollen exposure.  Marland Kitchen air conditioning in the bedroom and throughout the house if possible.  . Avoid going out in dry windy days - especially early morning. . Pollen counts are highest between 5 - 10 AM and on dry, hot and windy days.  . Save outside  activities for late afternoon or after a heavy rain, when pollen levels are lower.  . Avoid mowing of grass if you have grass pollen allergy. Lilian Kapur Be aware that pollen can also be transported indoors on people and pets.  . Dry your clothes in an automatic dryer rather than hanging them outside where they might collect pollen.  . Rinse hair and eyes before bedtime. Control of House Dust Mite Allergen . Dust mite allergens are a common trigger of allergy and asthma symptoms. While they can be found throughout the house, these microscopic creatures thrive in warm, humid environments such as bedding, upholstered furniture and carpeting. . Because so much time is spent in the bedroom, it is essential to reduce mite levels there.  . Encase pillows, mattresses, and box springs in special allergen-proof fabric covers or airtight, zippered plastic covers.  . Bedding should be washed weekly in hot water (130 F) and dried in a hot dryer. Allergen-proof covers are available for comforters and pillows that can't be regularly washed.  Marland Kitchen the allergy-proof covers every few months. Minimize clutter in the bedroom. Keep pets out of the bedroom.  Reyes Ivan Keep humidity less than 50% by using a dehumidifier or air conditioning. You can buy a humidity measuring device called a hygrometer to monitor this.  . If possible, replace carpets with hardwood, linoleum, or washable area rugs. If that's not possible, vacuum frequently with a vacuum that has a HEPA filter. . Remove all upholstered furniture and non-washable window  drapes from the bedroom. . Remove all non-washable stuffed toys from the bedroom.  Wash stuffed toys weekly. Pet Allergen Avoidance: . Contrary to popular opinion, there are no "hypoallergenic" breeds of dogs or cats. That is because people are not allergic to an animal's hair, but to an allergen found in the animal's saliva, dander (dead skin flakes) or urine. Pet allergy symptoms typically occur within  minutes. For some people, symptoms can build up and become most severe 8 to 12 hours after contact with the animal. People with severe allergies can experience reactions in public places if dander has been transported on the pet owners' clothing. Marland Kitchen Keeping an animal outdoors is only a partial solution, since homes with pets in the yard still have higher concentrations of animal allergens. . Before getting a pet, ask your allergist to determine if you are allergic to animals. If your pet is already considered part of your family, try to minimize contact and keep the pet out of the bedroom and other rooms where you spend a great deal of time. . As with dust mites, vacuum carpets often or replace carpet with a hardwood floor, tile or linoleum. . High-efficiency particulate air (HEPA) cleaners can reduce allergen levels over time. . While dander and saliva are the source of cat and dog allergens, urine is the source of allergens from rabbits, hamsters, mice and [place] pigs; so ask a non-allergic family member to clean the animal's cage. . If you have a pet allergy, talk to your allergist about the potential for allergy immunotherapy (allergy shots). This strategy can often provide long-term relief. Mold Control . Mold and fungi can grow on a variety of surfaces provided certain temperature and moisture conditions exist.  . Outdoor molds grow on plants, decaying vegetation and soil. The major outdoor mold, Alternaria and Cladosporium, are found in very high numbers during hot and dry conditions. Generally, a late summer - fall peak is seen for common outdoor fungal spores. Rain will temporarily lower outdoor mold spore count, but counts rise rapidly when the rainy period ends. . The most important indoor molds are Aspergillus and Penicillium. Dark, humid and poorly ventilated basements are ideal sites for mold growth. The next most common sites of mold growth are the bathroom and the kitchen. Outdoor  (Seasonal) Mold Control . Use air conditioning and keep windows closed. . Avoid exposure to decaying vegetation. Marland Kitchen Avoid leaf raking. . Avoid grain handling. . Consider wearing a face mask if working in moldy areas.  Indoor (Perennial) Mold Control  . Maintain humidity below 50%. . Get rid of mold growth on hard surfaces with water, detergent and, if necessary, 5% bleach (do not mix with other cleaners). Then dry the area completely. If mold covers an area more than 10 square feet, consider hiring an indoor environmental professional. . For clothing, washing with soap and water is best. If moldy items cannot be cleaned and dried, throw them away. . Remove sources e.g. contaminated carpets. . Repair and seal leaking roofs or pipes. Using dehumidifiers in damp basements may be helpful, but empty the water and clean units regularly to prevent mildew from forming. All rooms, especially basements, bathrooms and kitchens, require ventilation and cleaning to deter mold and mildew growth. Avoid carpeting on concrete or damp floors, and storing items in damp areas.

## 2020-02-17 NOTE — Assessment & Plan Note (Addendum)
Past history - Diagnosed with asthma over 10 years ago however the last few months noticed increasing symptoms.  She was restarted on Qvar 80 1 puff twice a day with good benefit.  Using albuterol once a week.  Triggers include allergies and exertion. Interim history - 2-3 episodes of using albuterol per month with good benefit. No specific triggers noted.   Today's spirometry was normal. ACT score 22. . Daily controller medication(s): Increase Qvar to 2 puffs twice a day and rinse mouth afterwards.  o If this does not improve your shortness of breath episodes after 1-2 months then let us know.  . Prior to physical activity: May use albuterol rescue inhaler 2 puffs 5 to 15 minutes prior to strenuous physical activities. Marland Kitchen Rescue medications: May use albuterol rescue inhaler 2 puffs or nebulizer every 4 to 6 hours as needed for shortness of breath, chest tightness, coughing, and wheezing. Monitor frequency of use.  . Repeat spirometry at next visit.

## 2020-02-25 ENCOUNTER — Ambulatory Visit: Payer: Commercial Managed Care - PPO | Admitting: Obstetrics and Gynecology

## 2020-03-10 ENCOUNTER — Ambulatory Visit (INDEPENDENT_AMBULATORY_CARE_PROVIDER_SITE_OTHER): Payer: Commercial Managed Care - PPO | Admitting: Emergency Medicine

## 2020-03-10 ENCOUNTER — Encounter: Payer: Self-pay | Admitting: Emergency Medicine

## 2020-03-10 ENCOUNTER — Other Ambulatory Visit: Payer: Self-pay

## 2020-03-10 VITALS — BP 102/67 | HR 74 | Temp 98.4°F | Resp 16 | Ht 65.0 in | Wt 118.0 lb

## 2020-03-10 DIAGNOSIS — N390 Urinary tract infection, site not specified: Secondary | ICD-10-CM | POA: Diagnosis not present

## 2020-03-10 DIAGNOSIS — R350 Frequency of micturition: Secondary | ICD-10-CM | POA: Diagnosis not present

## 2020-03-10 LAB — POC MICROSCOPIC URINALYSIS (UMFC)

## 2020-03-10 LAB — POCT URINALYSIS DIP (MANUAL ENTRY)
Bilirubin, UA: NEGATIVE
Glucose, UA: NEGATIVE mg/dL
Ketones, POC UA: NEGATIVE mg/dL
Nitrite, UA: NEGATIVE
Protein Ur, POC: NEGATIVE mg/dL
Spec Grav, UA: 1.02 (ref 1.010–1.025)
Urobilinogen, UA: 0.2 E.U./dL
pH, UA: 6 (ref 5.0–8.0)

## 2020-03-10 LAB — POCT URINE PREGNANCY: Preg Test, Ur: NEGATIVE

## 2020-03-10 MED ORDER — CEFUROXIME AXETIL 500 MG PO TABS: 500.0000 mg | ORAL_TABLET | Freq: Two times a day (BID) | ORAL | 0 refills | Status: DC

## 2020-03-10 NOTE — Patient Instructions (Addendum)
   If you have lab work done today you will be contacted with your lab results within the next 2 weeks.  If you have not heard from us then please contact us. The fastest way to get your results is to register for My Chart.   IF you received an x-ray today, you will receive an invoice from [facility]. Please contact  Radiology at [phone] with questions or concerns regarding your invoice.   IF you received labwork today, you will receive an invoice from [facility]. Please contact [facility] at [phone] with questions or concerns regarding your invoice.   Our billing staff will not be able to assist you with questions regarding bills from these companies.  You will be contacted with the lab results as soon as they are available. The fastest way to get your results is to activate your My Chart account. Instructions are located on the last page of this paperwork. If you have not heard from us regarding the results in 2 weeks, please contact this office.     Urinary Tract Infection, Adult A urinary tract infection (UTI) is an infection of any part of the urinary tract. The urinary tract includes:  The kidneys.  The ureters.  The bladder.  The urethra. These organs make, store, and get rid of pee (urine) in the body. What are the causes? This is caused by germs (bacteria) in your genital area. These germs grow and cause swelling (inflammation) of your urinary tract. What increases the risk? You are more likely to develop this condition if:  You have a small, thin tube (catheter) to drain pee.  You cannot control when you pee or poop (incontinence).  You are female, and: ? You use these methods to prevent pregnancy:  A medicine that kills sperm (spermicide).  A device that blocks sperm (diaphragm). ? You have low levels of a female hormone (estrogen). ? You are pregnant.  You have genes that add to your risk.  You are sexually active.  You take  antibiotic medicines.  You have trouble peeing because of: ? A prostate that is bigger than normal, if you are female. ? A blockage in the part of your body that drains pee from the bladder (urethra). ? A kidney stone. ? A nerve condition that affects your bladder (neurogenic bladder). ? Not getting enough to drink. ? Not peeing often enough.  You have other conditions, such as: ? Diabetes. ? A weak disease-fighting system (immune system). ? Sickle cell disease. ? Gout. ? Injury of the spine. What are the signs or symptoms? Symptoms of this condition include:  Needing to pee right away (urgently).  Peeing often.  Peeing small amounts often.  Pain or burning when peeing.  Blood in the pee.  Pee that smells bad or not like normal.  Trouble peeing.  Pee that is cloudy.  Fluid coming from the vagina, if you are female.  Pain in the belly or lower back. Other symptoms include:  Throwing up (vomiting).  No urge to eat.  Feeling mixed up (confused).  Being tired and grouchy (irritable).  A fever.  Watery poop (diarrhea). How is this treated? This condition may be treated with:  Antibiotic medicine.  Other medicines.  Drinking enough water. Follow these instructions at home:  Medicines  Take over-the-counter and prescription medicines only as told by your doctor.  If you were prescribed an antibiotic medicine, take it as told by your doctor. Do not stop taking it even if   you start to feel better. General instructions  Make sure you: ? Pee until your bladder is empty. ? Do not hold pee for a long time. ? Empty your bladder after sex. ? Wipe from front to back after pooping if you are a female. Use each tissue one time when you wipe.  Drink enough fluid to keep your pee pale yellow.  Keep all follow-up visits as told by your doctor. This is important. Contact a doctor if:  You do not get better after 1-2 days.  Your symptoms go away and then come  back. Get help right away if:  You have very bad back pain.  You have very bad pain in your lower belly.  You have a fever.  You are sick to your stomach (nauseous).  You are throwing up. Summary  A urinary tract infection (UTI) is an infection of any part of the urinary tract.  This condition is caused by germs in your genital area.  There are many risk factors for a UTI. These include having a small, thin tube to drain pee and not being able to control when you pee or poop.  Treatment includes antibiotic medicines for germs.  Drink enough fluid to keep your pee pale yellow. This information is not intended to replace advice given to you by your health care provider. Make sure you discuss any questions you have with your health care provider. Document Revised: 05/15/2018 Document Reviewed: 12/05/2017 Elsevier Patient Education  2020 Elsevier Inc.  

## 2020-03-10 NOTE — Progress Notes (Signed)
Judy [name] 24 y.o.   Chief Complaint  Patient presents with  . Urinary Tract Infection    per patient started yesterday with frequency and burning    HISTORY OF PRESENT ILLNESS: This is a 24 y.o. female complaining of urinary frequency and burning that started yesterday. Denies fever or chills.  Denies nausea or vomiting.  Able to eat and drink.  Denies flank pain. No other complaints or medical concerns today.  HPI   Prior to Admission medications   Medication Sig Start Date End Date Taking? Authorizing Provider  albuterol (VENTOLIN HFA) 108 (90 Base) MCG/ACT inhaler Inhale 2 puffs into the lungs every 4 (four) hours as needed for wheezing or shortness of breath. 02/17/20   Ellamae Sia, DO  cetirizine (ZYRTEC) 10 MG tablet Take 1 tablet (10 mg total) by mouth daily as needed (for allergies.). 09/25/18   Myles Lipps, MD  EPINEPHrine 0.3 mg/0.3 mL IJ SOAJ injection Inject 0.3 mLs (0.3 mg total) into the muscle once as needed for anaphylaxis. 05/26/19   Kozlow, Alvira Philips, MD  famotidine (PEPCID) 20 MG tablet Take 1 tablet (20 mg total) by mouth 2 (two) times daily as needed for heartburn or indigestion. 02/09/20   Myles Lipps, MD  imipramine (TOFRANIL) 25 MG tablet Take 50 mg by mouth daily. 01/15/20   [provider]  Naphazoline-Pheniramine (OPCON-A) 0.027-0.315 % SOLN Place 1 drop into both eyes 3 (three) times daily as needed (for irritated/itchy eyes.).    [provider]  naratriptan (AMERGE) 2.5 MG tablet Take 1 mg by mouth as needed. 01/22/20   [provider]  QVAR REDIHALER 80 MCG/ACT inhaler Inhale 2 puffs into the lungs 2 (two) times daily. Rinse mouth after each use. 02/17/20   Ellamae Sia, DO    Allergies  Allergen Reactions  . Pollen Extract Other (See Comments)    Sneezing/itchy eyes/runny nose    Patient Active Problem List   Diagnosis Date Noted  . Mild persistent asthma without complication 12/11/2018  . Unspecified asthma(493.90)  04/20/2013  . Cat allergies 04/20/2013  . Seasonal and perennial allergic rhinoconjunctivitis 04/20/2013    Past Medical History:  Diagnosis Date  . Acne    tx with spironolactone  . Allergy    Has allergies to dogs and cats.  . Anxiety    no meds  . Asthma    as a child, rarely uses inhaler  . Cat allergies 04/20/2013  . Depression    no meds  . GERD (gastroesophageal reflux disease)    occasional  . Migraine headache with aura    last one 04/2017, triggers lack of sleep-otc prn    Past Surgical History:  Procedure Laterality Date  . LESION REMOVAL N/A 05/28/2017   Procedure: PLASTICS REPAIR OF INTROIDUS;  Surgeon: Romualdo Bolk, MD;  Location: [facility];  Service: Gynecology;  Laterality: N/A;  . WISDOM TOOTH EXTRACTION      Social History   Socioeconomic History  . Marital status: Single    Spouse name: Not on file  . Number of children: Not on file  . Years of education: Not on file  . Highest education level: Not on file  Occupational History  . Not on file  Tobacco Use  . Smoking status: Never Smoker  . Smokeless tobacco: Never Used  Vaping Use  . Vaping Use: Never used  Substance and Sexual Activity  . Alcohol use: No  . Drug use: No  . Sexual activity: Not  Currently    Birth control/protection: Abstinence    Comment: female partner  Other Topics Concern  . Not on file  Social History Narrative  . Not on file   Social Determinants of Health   Financial Resource Strain:   . Difficulty of Paying Living Expenses: Not on file  Food Insecurity:   . Worried About Programme researcher, broadcasting/film/videounning Out of Food in the Last Year: Not on file  . Ran Out of Food in the Last Year: Not on file  Transportation Needs:   . Lack of Transportation (Medical): Not on file  . Lack of Transportation (Non-Medical): Not on file  Physical Activity:   . Days of Exercise per Week: Not on file  . Minutes of Exercise per Session: Not on file  Stress:   . Feeling of Stress : Not on file    Social Connections:   . Frequency of Communication with Friends and Family: Not on file  . Frequency of Social Gatherings with Friends and Family: Not on file  . Attends Religious Services: Not on file  . Active Member of Clubs or Organizations: Not on file  . Attends BankerClub or Organization Meetings: Not on file  . Marital Status: Not on file  Intimate Partner Violence:   . Fear of Current or Ex-Partner: Not on file  . Emotionally Abused: Not on file  . Physically Abused: Not on file  . Sexually Abused: Not on file    Family History  Problem Relation Age of Onset  . Hypertension Mother   . Hyperlipidemia Mother   . Scoliosis Mother   . Lupus Maternal Grandmother   . Osteoporosis Maternal Grandmother   . Heart disease Maternal Grandfather   . Hypertension Maternal Grandfather   . Diabetes Maternal Grandfather   . Emphysema Maternal Grandfather   . Heart attack Maternal Grandfather        occurred < 22 years old  . Hyperlipidemia Maternal Grandfather   . COPD Maternal Grandfather   . Cancer Maternal Grandfather        skin cancer  . Osteoarthritis Paternal Grandmother   . Diabetes Paternal Grandfather   . Heart disease Paternal Grandfather   . Kidney failure Paternal Grandfather        2 transplants due to DM  . Hypertension Paternal Grandfather   . Hyperlipidemia Paternal Grandfather   . Stroke Paternal Grandfather   . Parkinson's disease Paternal Grandfather      Review of Systems  Constitutional: Negative.  Negative for chills and fever.  HENT: Negative for congestion and sore throat.   Respiratory: Negative.  Negative for cough and shortness of breath.   Cardiovascular: Negative for chest pain and palpitations.  Gastrointestinal: Negative for abdominal pain, diarrhea, nausea and vomiting.  Genitourinary: Positive for dysuria and frequency. Negative for flank pain and hematuria.  Skin: Negative.  Negative for rash.  Neurological: Negative for dizziness and  headaches.  All other systems reviewed and are negative.  Today's Vitals   03/10/20 0828  BP: 102/67  Pulse: 74  Resp: 16  Temp: 98.4 F (36.9 C)  TempSrc: Temporal  SpO2: 96%  Weight: 118 lb (53.5 kg)  Height: 5\' 5"  (1.651 m)   Body mass index is 19.64 kg/m.   Physical Exam Vitals reviewed.  Constitutional:      Appearance: Normal appearance.  HENT:     Head: Normocephalic.  Eyes:     Extraocular Movements: Extraocular movements intact.     Pupils: Pupils are equal, round, and reactive  to light.  Cardiovascular:     Rate and Rhythm: Normal rate and regular rhythm.  Pulmonary:     Effort: Pulmonary effort is normal.  Musculoskeletal:        General: Normal range of motion.     Cervical back: Normal range of motion and neck supple.  Skin:    General: Skin is warm and dry.  Neurological:     General: No focal deficit present.     Mental Status: She is alert and oriented to person, place, and time.  Psychiatric:        Mood and Affect: Mood normal.        Behavior: Behavior normal.    Results for orders placed or performed in visit on 03/10/20 (from the past 24 hour(s))  POCT urinalysis dipstick     Status: Abnormal   Collection Time: 03/10/20  8:44 AM  Result Value Ref Range   Color, UA yellow yellow   Clarity, UA clear clear   Glucose, UA negative negative mg/dL   Bilirubin, UA negative negative   Ketones, POC UA negative negative mg/dL   Spec Grav, UA 4.967 5.916 - 1.025   Blood, UA trace-lysed (A) negative   pH, UA 6.0 5.0 - 8.0   Protein Ur, POC negative negative mg/dL   Urobilinogen, UA 0.2 0.2 or 1.0 E.U./dL   Nitrite, UA Negative Negative   Leukocytes, UA Small (1+) (A) Negative  POCT urine pregnancy     Status: None   Collection Time: 03/10/20  8:47 AM  Result Value Ref Range   Preg Test, Ur Negative Negative  POCT Microscopic Urinalysis ([facility])     Status: Abnormal   Collection Time: 03/10/20  8:47 AM  Result Value Ref Range   WBC,UR,HPF,POC  Moderate (A) None WBC/hpf   RBC,UR,HPF,POC None None RBC/hpf   Bacteria Many (A) None, Too numerous to count   Mucus Present (A) Absent   Epithelial Cells, UR Per Microscopy Moderate (A) None, Too numerous to count cells/hpf     ASSESSMENT & PLAN: [place] was seen today for urinary tract infection.  Diagnoses and all orders for this visit:  Acute UTI -     cefUROXime (CEFTIN) 500 MG tablet; Take 1 tablet (500 mg total) by mouth 2 (two) times daily with a meal for 7 days.  Urinary frequency -     POCT urinalysis dipstick -     POCT urine pregnancy -     POCT Microscopic Urinalysis ([facility]) -     Urine Culture    Patient Instructions       If you have lab work done today you will be contacted with your lab results within the next 2 weeks.  If you have not heard from [place] then please contact us. The fastest way to get your results is to register for My Chart.   IF you received an x-ray today, you will receive an invoice from [facility]. Please contact [facility] at [phone] with questions or concerns regarding your invoice.   IF you received labwork today, you will receive an invoice from [employer]. Please contact [facility] at [phone] with questions or concerns regarding your invoice.   Our billing staff will not be able to assist you with questions regarding bills from these companies.  You will be contacted with the lab results as soon as they are available. The fastest way to get your results is to activate your My Chart account. Instructions are located on the last page of  this paperwork. If you have not heard from [place] regarding the results in 2 weeks, please contact this office.     Urinary Tract Infection, Adult A urinary tract infection (UTI) is an infection of any part of the urinary tract. The urinary tract includes:  The kidneys.  The ureters.  The bladder.  The urethra. These organs make, store, and get rid of pee (urine) in the  body. What are the causes? This is caused by germs (bacteria) in your genital area. These germs grow and cause swelling (inflammation) of your urinary tract. What increases the risk? You are more likely to develop this condition if:  You have a small, thin tube (catheter) to drain pee.  You cannot control when you pee or poop (incontinence).  You are female, and: ? You use these methods to prevent pregnancy:  A medicine that kills sperm (spermicide).  A device that blocks sperm (diaphragm). ? You have low levels of a female hormone (estrogen). ? You are pregnant.  You have genes that add to your risk.  You are sexually active.  You take antibiotic medicines.  You have trouble peeing because of: ? A prostate that is bigger than normal, if you are female. ? A blockage in the part of your body that drains pee from the bladder (urethra). ? A kidney stone. ? A nerve condition that affects your bladder (neurogenic bladder). ? Not getting enough to drink. ? Not peeing often enough.  You have other conditions, such as: ? Diabetes. ? A weak disease-fighting system (immune system). ? Sickle cell disease. ? Gout. ? Injury of the spine. What are the signs or symptoms? Symptoms of this condition include:  Needing to pee right away (urgently).  Peeing often.  Peeing small amounts often.  Pain or burning when peeing.  Blood in the pee.  Pee that smells bad or not like normal.  Trouble peeing.  Pee that is cloudy.  Fluid coming from the vagina, if you are female.  Pain in the belly or lower back. Other symptoms include:  Throwing up (vomiting).  No urge to eat.  Feeling mixed up (confused).  Being tired and grouchy (irritable).  A fever.  Watery poop (diarrhea). How is this treated? This condition may be treated with:  Antibiotic medicine.  Other medicines.  Drinking enough water. Follow these instructions at home:  Medicines  Take over-the-counter  and prescription medicines only as told by your doctor.  If you were prescribed an antibiotic medicine, take it as told by your doctor. Do not stop taking it even if you start to feel better. General instructions  Make sure you: ? Pee until your bladder is empty. ? Do not hold pee for a long time. ? Empty your bladder after sex. ? Wipe from front to back after pooping if you are a female. Use each tissue one time when you wipe.  Drink enough fluid to keep your pee pale yellow.  Keep all follow-up visits as told by your doctor. This is important. Contact a doctor if:  You do not get better after 1-2 days.  Your symptoms go away and then come back. Get help right away if:  You have very bad back pain.  You have very bad pain in your lower belly.  You have a fever.  You are sick to your stomach (nauseous).  You are throwing up. Summary  A urinary tract infection (UTI) is an infection of any part of the urinary tract.  This  condition is caused by germs in your genital area.  There are many risk factors for a UTI. These include having a small, thin tube to drain pee and not being able to control when you pee or poop.  Treatment includes antibiotic medicines for germs.  Drink enough fluid to keep your pee pale yellow. This information is not intended to replace advice given to you by your health care provider. Make sure you discuss any questions you have with your health care provider. Document Revised: 05/15/2018 Document Reviewed: 12/05/2017 Elsevier Patient Education  2020 Elsevier Inc.      Edwina Barth, MD [facility]

## 2020-03-11 ENCOUNTER — Ambulatory Visit (INDEPENDENT_AMBULATORY_CARE_PROVIDER_SITE_OTHER): Payer: Commercial Managed Care - PPO | Admitting: *Deleted

## 2020-03-11 DIAGNOSIS — J309 Allergic rhinitis, unspecified: Secondary | ICD-10-CM | POA: Diagnosis not present

## 2020-03-12 LAB — URINE CULTURE

## 2020-03-15 ENCOUNTER — Encounter: Payer: Self-pay | Admitting: Emergency Medicine

## 2020-03-15 ENCOUNTER — Other Ambulatory Visit: Payer: Self-pay

## 2020-03-15 ENCOUNTER — Telehealth (INDEPENDENT_AMBULATORY_CARE_PROVIDER_SITE_OTHER): Payer: Commercial Managed Care - PPO | Admitting: Emergency Medicine

## 2020-03-15 VITALS — Ht 65.0 in | Wt 125.0 lb

## 2020-03-15 DIAGNOSIS — U071 COVID-19: Secondary | ICD-10-CM

## 2020-03-15 DIAGNOSIS — N390 Urinary tract infection, site not specified: Secondary | ICD-10-CM

## 2020-03-15 MED ORDER — CEFUROXIME AXETIL 500 MG PO TABS: 500.0000 mg | ORAL_TABLET | Freq: Two times a day (BID) | ORAL | 0 refills | Status: AC

## 2020-03-15 NOTE — Progress Notes (Signed)
Telemedicine Encounter- SOAP NOTE Established Patient MyChart video conference This video telephone encounter was conducted with the patient's (or proxy's) verbal consent via video audio telecommunications: yes/no: Yes Patient was instructed to have this encounter in a suitably private space; and to only have persons present to whom they give permission to participate. In addition, patient identity was confirmed by use of name plus two identifiers (DOB and address).  I discussed the limitations, risks, security and privacy concerns of performing an evaluation and management service by telephone and the availability of in person appointments. I also discussed with the patient that there may be a patient responsible charge related to this service. The patient expressed understanding and agreed to proceed.  I spent a total of TIME; 0 MIN TO 60 MIN: 20 minutes talking with the patient or their proxy.  Chief Complaint  Patient presents with  . Cough    per patient tested positive for Covid 19 03/02/2020 and had vaccine [employer]); on 03/11/2020 symptoms started chest/nasal congestion with green mucus  . Sore Throat    and per patient she wants to discuss UTI results    Subjective   Judy [name] is a 24 y.o. female established patient. Telephone visit today with 2 concerns: 1.  Recently tested positive for Covid.  Had vaccine earlier this year.  Had sore throat and chest and nasal congestion but better today than yesterday.  Denies difficulty breathing.  Doing well. 2.  Recent office visit with UTI symptoms.  Started on Ceftin 500 mg twice a day.  Urine culture inconclusive with mixed urogenital flora. Had recurrence of symptoms yesterday, milder than at onset.  Better today.  Has 1-2 more days of antibiotics. No other complaints or medical concerns.  Requesting additional prescription of antibiotic in case symptoms come back while she is out of town this coming weekend.  HPI   Patient Active  Problem List   Diagnosis Date Noted  . Mild persistent asthma without complication 12/11/2018  . Unspecified asthma(493.90) 04/20/2013  . Cat allergies 04/20/2013  . Seasonal and perennial allergic rhinoconjunctivitis 04/20/2013    Past Medical History:  Diagnosis Date  . Acne    tx with spironolactone  . Allergy    Has allergies to dogs and cats.  . Anxiety    no meds  . Asthma    as a child, rarely uses inhaler  . Cat allergies 04/20/2013  . Depression    no meds  . GERD (gastroesophageal reflux disease)    occasional  . Migraine headache with aura    last one 04/2017, triggers lack of sleep-otc prn    Current Outpatient Medications  Medication Sig Dispense Refill  . albuterol (VENTOLIN HFA) 108 (90 Base) MCG/ACT inhaler Inhale 2 puffs into the lungs every 4 (four) hours as needed for wheezing or shortness of breath. 18 g 1  . cefUROXime (CEFTIN) 500 MG tablet Take 1 tablet (500 mg total) by mouth 2 (two) times daily with a meal for 7 days. 14 tablet 0  . cetirizine (ZYRTEC) 10 MG tablet Take 1 tablet (10 mg total) by mouth daily as needed (for allergies.). 30 tablet 2  . EPINEPHrine 0.3 mg/0.3 mL IJ SOAJ injection Inject 0.3 mLs (0.3 mg total) into the muscle once as needed for anaphylaxis. 2 each 1  . Etonogestrel (NEXPLANON [place]) Inject into the skin.    . famotidine (PEPCID) 20 MG tablet Take 1 tablet (20 mg total) by mouth 2 (two) times daily as needed  for heartburn or indigestion. 60 tablet 1  . imipramine (TOFRANIL) 25 MG tablet Take 50 mg by mouth daily.    . Naphazoline-Pheniramine (OPCON-A) 0.027-0.315 % SOLN Place 1 drop into both eyes 3 (three) times daily as needed (for irritated/itchy eyes.).    Marland Kitchen naratriptan (AMERGE) 2.5 MG tablet Take 1 mg by mouth as needed.    Marland Kitchen QVAR REDIHALER 80 MCG/ACT inhaler Inhale 2 puffs into the lungs 2 (two) times daily. Rinse mouth after each use. 1 each 5   No current facility-administered medications for this visit.    Allergies   Allergen Reactions  . Pollen Extract Other (See Comments)    Sneezing/itchy eyes/runny nose    Social History   Socioeconomic History  . Marital status: Single    Spouse name: Not on file  . Number of children: Not on file  . Years of education: Not on file  . Highest education level: Not on file  Occupational History  . Not on file  Tobacco Use  . Smoking status: Never Smoker  . Smokeless tobacco: Never Used  Vaping Use  . Vaping Use: Never used  Substance and Sexual Activity  . Alcohol use: No  . Drug use: No  . Sexual activity: Not Currently    Birth control/protection: Abstinence    Comment: female partner  Other Topics Concern  . Not on file  Social History Narrative  . Not on file   Social Determinants of Health   Financial Resource Strain:   . Difficulty of Paying Living Expenses: Not on file  Food Insecurity:   . Worried About Programme researcher, broadcasting/film/video in the Last Year: Not on file  . Ran Out of Food in the Last Year: Not on file  Transportation Needs:   . Lack of Transportation (Medical): Not on file  . Lack of Transportation (Non-Medical): Not on file  Physical Activity:   . Days of Exercise per Week: Not on file  . Minutes of Exercise per Session: Not on file  Stress:   . Feeling of Stress : Not on file  Social Connections:   . Frequency of Communication with Friends and Family: Not on file  . Frequency of Social Gatherings with Friends and Family: Not on file  . Attends Religious Services: Not on file  . Active Member of Clubs or Organizations: Not on file  . Attends Banker Meetings: Not on file  . Marital Status: Not on file  Intimate Partner Violence:   . Fear of Current or Ex-Partner: Not on file  . Emotionally Abused: Not on file  . Physically Abused: Not on file  . Sexually Abused: Not on file    Review of Systems  Constitutional: Negative.  Negative for chills and fever.  HENT: Positive for congestion and sore throat.     Respiratory: Positive for cough. Negative for shortness of breath.   Cardiovascular: Negative.  Negative for chest pain and palpitations.  Gastrointestinal: Negative.  Negative for abdominal pain, diarrhea, nausea and vomiting.  Genitourinary: Negative.  Negative for dysuria, flank pain, frequency, hematuria and urgency.  Skin: Negative.  Negative for rash.  Neurological: Negative.  Negative for dizziness and headaches.  All other systems reviewed and are negative.   Objective  Alert and oriented x3 in no apparent respiratory distress. Vitals as reported by the patient: Today's Vitals   03/15/20 1634  Weight: 125 lb (56.7 kg)  Height: 5\' 5"  (1.651 m)    Lesta was seen today for  cough and sore throat.  Diagnoses and all orders for this visit:  COVID-19 virus infection Comments: Stable and without complications  Acute UTI Comments: Improving Orders: -     cefUROXime (CEFTIN) 500 MG tablet; Take 1 tablet (500 mg total) by mouth 2 (two) times daily with a meal for 7 days.     I discussed the assessment and treatment plan with the patient. The patient was provided an opportunity to ask questions and all were answered. The patient agreed with the plan and demonstrated an understanding of the instructions.   The patient was advised to call back or seek an in-person evaluation if the symptoms worsen or if the condition fails to improve as anticipated.  I provided 20 minutes of non-face-to-face time during this encounter.  Georgina Quint, MD  Primary Care at [facility]

## 2020-03-17 ENCOUNTER — Ambulatory Visit (INDEPENDENT_AMBULATORY_CARE_PROVIDER_SITE_OTHER): Payer: Commercial Managed Care - PPO

## 2020-03-17 DIAGNOSIS — J309 Allergic rhinitis, unspecified: Secondary | ICD-10-CM

## 2020-03-31 ENCOUNTER — Ambulatory Visit (INDEPENDENT_AMBULATORY_CARE_PROVIDER_SITE_OTHER): Payer: Commercial Managed Care - PPO | Admitting: *Deleted

## 2020-03-31 DIAGNOSIS — J309 Allergic rhinitis, unspecified: Secondary | ICD-10-CM | POA: Diagnosis not present

## 2020-04-06 ENCOUNTER — Telehealth: Payer: Self-pay

## 2020-04-06 ENCOUNTER — Telehealth: Payer: Self-pay | Admitting: Emergency Medicine

## 2020-04-06 DIAGNOSIS — N390 Urinary tract infection, site not specified: Secondary | ICD-10-CM

## 2020-04-06 NOTE — Telephone Encounter (Signed)
Spoke with patient. Patient reports hx of recurrent UTIs, 6 over the past year, treated by PCP. States she is currently on abx for UTI symptoms prescribed by PCP. Patient is requesting a referral to Urology. Has not discussed referral with PCP.    Advised OV needed for further evaluation before referral can be made, patient declined. Advised patient she can also discuss referral with PCP since they have been treating UTIs. Patient states she will contact PCP to further discuss, will return call to office to schedule OV, if needed.   Last AEX 02/20/19, recommended updating AEX. Patient agreeable to schedule. AEX scheduled for 07/25/20, patient declines earlier appt offered.   Routing to provider for final review. Patient is agreeable to disposition. Will close encounter.

## 2020-04-06 NOTE — Telephone Encounter (Signed)
Pt returned Judy [name] / please try her back tomorrow / anytime in afternoon . Patient is working today and can't answer phone .

## 2020-04-06 NOTE — Telephone Encounter (Signed)
Attempted to call pt. No answer so I left a message to call back.  Plan: to ask why she needed the urology referral. What was her concern?

## 2020-04-06 NOTE — Telephone Encounter (Signed)
Patient would like a referral to a urologist. °

## 2020-04-06 NOTE — Telephone Encounter (Signed)
Patient is wanting to request referral for Female urologist   OK to  follow up per  phone  Or mychart message

## 2020-04-07 ENCOUNTER — Ambulatory Visit (INDEPENDENT_AMBULATORY_CARE_PROVIDER_SITE_OTHER): Payer: Commercial Managed Care - PPO | Admitting: *Deleted

## 2020-04-07 DIAGNOSIS — J309 Allergic rhinitis, unspecified: Secondary | ICD-10-CM | POA: Diagnosis not present

## 2020-04-11 DIAGNOSIS — J302 Other seasonal allergic rhinitis: Secondary | ICD-10-CM

## 2020-04-11 NOTE — Progress Notes (Signed)
VIALS EXP 04-11-21 °

## 2020-04-13 ENCOUNTER — Ambulatory Visit: Payer: Commercial Managed Care - PPO | Admitting: Registered Nurse

## 2020-04-13 ENCOUNTER — Encounter: Payer: Self-pay | Admitting: Registered Nurse

## 2020-04-13 ENCOUNTER — Other Ambulatory Visit: Payer: Self-pay

## 2020-04-13 VITALS — BP 118/78 | HR 80 | Temp 97.6°F | Resp 18 | Ht 65.0 in | Wt 120.0 lb

## 2020-04-13 DIAGNOSIS — R42 Dizziness and giddiness: Secondary | ICD-10-CM

## 2020-04-13 MED ORDER — MECLIZINE HCL 12.5 MG PO TABS: 12.5000 mg | ORAL_TABLET | Freq: Two times a day (BID) | ORAL | 0 refills | Status: DC | PRN

## 2020-04-13 NOTE — Patient Instructions (Signed)
° ° ° °  If you have lab work done today you will be contacted with your lab results within the next 2 weeks.  If you have not heard from us then please contact us. The fastest way to get your results is to register for My Chart. ° ° °IF you received an x-ray today, you will receive an invoice from [facility]. Please contact [facility] at [phone] with questions or concerns regarding your invoice.  ° °IF you received labwork today, you will receive an invoice from [facility]. Please contact [facility] at [phone] with questions or concerns regarding your invoice.  ° °Our billing staff will not be able to assist you with questions regarding bills from these companies. ° °You will be contacted with the lab results as soon as they are available. The fastest way to get your results is to activate your My Chart account. Instructions are located on the last page of this paperwork. If you have not heard from us regarding the results in 2 weeks, please contact this office. °  ° ° ° °

## 2020-04-13 NOTE — Progress Notes (Signed)
Acute Office Visit  Subjective:    Patient ID: Berta MinorChloe Harrison, female    DOB: 1996/02/14, 24 y.o.   MRN: [id number]  Chief Complaint  Patient presents with  . Dizziness    Patient states she has been very fatigue and dizzy patient states she she possibly have vertigo. Patient would like to get some labs drawn to see.  . Lip Laceration    Patient would like to discuss sensitive tongue    HPI Patient is in today for dizziness and tongue sensitivity.  Dizziness: ongoing for a while, but particularly bad over the past three days. No falls but feels unsteady, particularly with movement or exertion. No LOC. No injury. Did recently taper off of Tofranil - had been on 50mg  PO qd for around a year. Took 5 days at 25mg  before cessation. Does not suspect withdrawal.  Most recent migraine was on Monday. Easily relieved by naratriptan. Is supposed to start on new migraine med but has delayed this due to these symptoms These have not happened before. No known hx of anemia. Does have known hx of b12 deficiency that has since resolved Eats regular diet, reports adequate hydration Symptoms not assoc with any particular time of day or pre/post prandial.  No aggravating or relieving factors noted Does have fam hx of lupus in M[place]. No other autoimmunity to her knowledge. Tongue feels sensitive/red. No bleeding. No injury. Has not happened before.   Past Medical History:  Diagnosis Date  . Acne    tx with spironolactone  . Allergy    Has allergies to dogs and cats.  . Anxiety    no meds  . Asthma    as a child, rarely uses inhaler  . Cat allergies 04/20/2013  . Depression    no meds  . GERD (gastroesophageal reflux disease)    occasional  . Migraine headache with aura   . Recurrent UTI     Past Surgical History:  Procedure Laterality Date  . LESION REMOVAL N/A 05/28/2017   Procedure: PLASTICS REPAIR OF INTROIDUS;  Surgeon: Romualdo BolkJertson, Jill Evelyn, MD;  Location: [facility];  Service: Gynecology;   Laterality: N/A;  . WISDOM TOOTH EXTRACTION      Family History  Problem Relation Age of Onset  . Hypertension Mother   . Hyperlipidemia Mother   . Scoliosis Mother   . Lupus Maternal Grandmother   . Osteoporosis Maternal Grandmother   . Heart disease Maternal Grandfather   . Hypertension Maternal Grandfather   . Diabetes Maternal Grandfather   . Emphysema Maternal Grandfather   . Heart attack Maternal Grandfather        occurred < 22 years old  . Hyperlipidemia Maternal Grandfather   . COPD Maternal Grandfather   . Cancer Maternal Grandfather        skin cancer  . Osteoarthritis Paternal Grandmother   . Diabetes Paternal Grandfather   . Heart disease Paternal Grandfather   . Kidney failure Paternal Grandfather        2 transplants due to DM  . Hypertension Paternal Grandfather   . Hyperlipidemia Paternal Grandfather   . Stroke Paternal Grandfather   . Parkinson's disease Paternal Grandfather     Social History   Socioeconomic History  . Marital status: Single    Spouse name: Not on file  . Number of children: Not on file  . Years of education: Not on file  . Highest education level: Not on file  Occupational History  . Not on file  Tobacco Use  . Smoking status: Never Smoker  . Smokeless tobacco: Never Used  Vaping Use  . Vaping Use: Never used  Substance and Sexual Activity  . Alcohol use: No  . Drug use: No  . Sexual activity: Not Currently    Birth control/protection: Abstinence    Comment: female partner  Other Topics Concern  . Not on file  Social History Narrative  . Not on file   Social Determinants of Health   Financial Resource Strain:   . Difficulty of Paying Living Expenses: Not on file  Food Insecurity:   . Worried About Programme researcher, broadcasting/film/video in the Last Year: Not on file  . Ran Out of Food in the Last Year: Not on file  Transportation Needs:   . Lack of Transportation (Medical): Not on file  . Lack of Transportation (Non-Medical): Not on  file  Physical Activity:   . Days of Exercise per Week: Not on file  . Minutes of Exercise per Session: Not on file  Stress:   . Feeling of Stress : Not on file  Social Connections:   . Frequency of Communication with Friends and Family: Not on file  . Frequency of Social Gatherings with Friends and Family: Not on file  . Attends Religious Services: Not on file  . Active Member of Clubs or Organizations: Not on file  . Attends Banker Meetings: Not on file  . Marital Status: Not on file  Intimate Partner Violence:   . Fear of Current or Ex-Partner: Not on file  . Emotionally Abused: Not on file  . Physically Abused: Not on file  . Sexually Abused: Not on file    Outpatient Medications Prior to Visit  Medication Sig Dispense Refill  . albuterol (VENTOLIN HFA) 108 (90 Base) MCG/ACT inhaler Inhale 2 puffs into the lungs every 4 (four) hours as needed for wheezing or shortness of breath. 18 g 1  . cetirizine (ZYRTEC) 10 MG tablet Take 1 tablet (10 mg total) by mouth daily as needed (for allergies.). 30 tablet 2  . EPINEPHrine 0.3 mg/0.3 mL IJ SOAJ injection Inject 0.3 mLs (0.3 mg total) into the muscle once as needed for anaphylaxis. 2 each 1  . Etonogestrel (NEXPLANON [place]) Inject into the skin.     . famotidine (PEPCID) 20 MG tablet Take 1 tablet (20 mg total) by mouth 2 (two) times daily as needed for heartburn or indigestion. 60 tablet 1  . imipramine (TOFRANIL) 25 MG tablet Take 50 mg by mouth daily.    . Naphazoline-Pheniramine (OPCON-A) 0.027-0.315 % SOLN Place 1 drop into both eyes 3 (three) times daily as needed (for irritated/itchy eyes.).    Marland Kitchen naratriptan (AMERGE) 2.5 MG tablet Take 1 mg by mouth as needed.    Marland Kitchen QVAR REDIHALER 80 MCG/ACT inhaler Inhale 2 puffs into the lungs 2 (two) times daily. Rinse mouth after each use. 1 each 5   No facility-administered medications prior to visit.    Allergies  Allergen Reactions  . Pollen Extract Other (See Comments)     Sneezing/itchy eyes/runny nose    Review of Systems Per hpi      Objective:    Physical Exam Vitals and nursing note reviewed.  Constitutional:      Appearance: Normal appearance.  Cardiovascular:     Rate and Rhythm: Normal rate and regular rhythm.  Pulmonary:     Effort: Pulmonary effort is normal. No respiratory distress.  Skin:    General: Skin is  warm and dry.  Neurological:     General: No focal deficit present.     Mental Status: She is alert and oriented to person, place, and time. Mental status is at baseline.  Psychiatric:        Mood and Affect: Mood normal.        Behavior: Behavior normal.        Thought Content: Thought content normal.        Judgment: Judgment normal.     BP 118/78   Pulse 80   Temp 97.6 F (36.4 C) (Temporal)   Resp 18   Ht 5\' 5"  (1.651 m)   Wt 120 lb (54.4 kg)   LMP 03/26/2020   SpO2 100%   BMI 19.97 kg/m  Wt Readings from Last 3 Encounters:  04/13/20 120 lb (54.4 kg)  03/15/20 125 lb (56.7 kg)  03/10/20 118 lb (53.5 kg)    There are no preventive care reminders to display for this patient.  There are no preventive care reminders to display for this patient.   Lab Results  Component Value Date   TSH 1.783 03/03/2014   Lab Results  Component Value Date   WBC 6.1 02/20/2019   HGB 14.7 02/20/2019   HCT 43.6 02/20/2019   MCV 97 02/20/2019   PLT 282 02/20/2019   Lab Results  Component Value Date   NA 139 05/12/2019   K 4.1 05/12/2019   CO2 21 05/12/2019   GLUCOSE 101 (H) 05/12/2019   BUN 9 05/12/2019   CREATININE 0.75 05/12/2019   BILITOT 0.4 02/20/2019   ALKPHOS 57 02/20/2019   AST 18 02/20/2019   ALT 10 02/20/2019   PROT 6.8 02/20/2019   ALBUMIN 4.9 02/20/2019   CALCIUM 9.8 05/12/2019   Lab Results  Component Value Date   CHOL 118 02/20/2019   Lab Results  Component Value Date   HDL 42 02/20/2019   Lab Results  Component Value Date   LDLCALC 57 02/20/2019   Lab Results  Component Value Date    TRIG 99 02/20/2019   Lab Results  Component Value Date   CHOLHDL 2.8 02/20/2019   No results found for: HGBA1C     Assessment & Plan:   Problem List Items Addressed This Visit    None    Visit Diagnoses    Dizziness    -  Primary   Relevant Medications   meclizine (ANTIVERT) 12.5 MG tablet   Other Relevant Orders   Vitamin D, 25-hydroxy   Vitamin B12   ANA w/Reflex   CBC   Iron, TIBC and Ferritin Panel   TSH   Basic Metabolic Panel       Meds ordered this encounter  Medications  . meclizine (ANTIVERT) 12.5 MG tablet    Sig: Take 1 tablet (12.5 mg total) by mouth 2 (two) times daily as needed for dizziness.    Dispense:  30 tablet    Refill:  0    Order Specific Question:   Supervising Provider    Answer:   04/22/2019, JEFFREY R [[id number]]   PLAN  Common etiologies for young healthy women would be dehydration, hypoglycemia, anemia, or thyroid dysfunction - discussed these with patient and will address with labs and nonpharm. Pt in agreement with plan.  Further suspicion for Vit D, B12, or other lyte deficiency that may be contributing.  Chance there is some BPPV aspect to this, short trial of meclizine in evenings after work to monitor for relief.  Will  draw ANA to help rule out autoimmunity.   Will follow up as warranted. Symptoms still fairly nonspecific.   Patient encouraged to call clinic with any questions, comments, or concerns.  I spent 35 minutes with this patient, more than 50% of which was spent counseling and/or educating.  [name], NP

## 2020-04-14 ENCOUNTER — Ambulatory Visit (INDEPENDENT_AMBULATORY_CARE_PROVIDER_SITE_OTHER): Payer: Commercial Managed Care - PPO

## 2020-04-14 DIAGNOSIS — J309 Allergic rhinitis, unspecified: Secondary | ICD-10-CM

## 2020-04-14 LAB — IRON,TIBC AND FERRITIN PANEL
Ferritin: 43 ng/mL (ref 15–150)
Iron Saturation: 32 % (ref 15–55)
Iron: 109 ug/dL (ref 27–159)
Total Iron Binding Capacity: 341 ug/dL (ref 250–450)
UIBC: 232 ug/dL (ref 131–425)

## 2020-04-14 LAB — BASIC METABOLIC PANEL
BUN/Creatinine Ratio: 11 (ref 9–23)
BUN: 7 mg/dL (ref 6–20)
CO2: 25 mmol/L (ref 20–29)
Calcium: 10.1 mg/dL (ref 8.7–10.2)
Chloride: 103 mmol/L (ref 96–106)
Creatinine, Ser: 0.64 mg/dL (ref 0.57–1.00)
GFR calc Af Amer: 146 mL/min/{1.73_m2} (ref >59)
GFR calc non Af Amer: 127 mL/min/{1.73_m2} (ref >59)
Glucose: 81 mg/dL (ref 65–99)
Potassium: 4.5 mmol/L (ref 3.5–5.2)
Sodium: 141 mmol/L (ref 134–144)

## 2020-04-14 LAB — CBC
Hematocrit: 43.5 % (ref 34.0–46.6)
Hemoglobin: 15.1 g/dL (ref 11.1–15.9)
MCH: 32.6 pg (ref 26.6–33.0)
MCHC: 34.7 g/dL (ref 31.5–35.7)
MCV: 94 fL (ref 79–97)
Platelets: 390 10*3/uL (ref 150–450)
RBC: 4.63 x10E6/uL (ref 3.77–5.28)
RDW: 11.3 % — ABNORMAL LOW (ref 11.7–15.4)
WBC: 9.7 10*3/uL (ref 3.4–10.8)

## 2020-04-14 LAB — VITAMIN B12: Vitamin B-12: 422 pg/mL (ref 232–1245)

## 2020-04-14 LAB — VITAMIN D 25 HYDROXY (VIT D DEFICIENCY, FRACTURES): Vit D, 25-Hydroxy: 24.5 ng/mL — ABNORMAL LOW (ref 30.0–100.0)

## 2020-04-14 LAB — TSH: TSH: 2.24 u[IU]/mL (ref 0.450–4.500)

## 2020-04-14 LAB — ANA W/REFLEX: Anti Nuclear Antibody (ANA): NEGATIVE

## 2020-04-18 DIAGNOSIS — J3089 Other allergic rhinitis: Secondary | ICD-10-CM

## 2020-04-28 ENCOUNTER — Ambulatory Visit (INDEPENDENT_AMBULATORY_CARE_PROVIDER_SITE_OTHER): Payer: Commercial Managed Care - PPO | Admitting: *Deleted

## 2020-04-28 DIAGNOSIS — J309 Allergic rhinitis, unspecified: Secondary | ICD-10-CM

## 2020-05-10 ENCOUNTER — Ambulatory Visit (INDEPENDENT_AMBULATORY_CARE_PROVIDER_SITE_OTHER): Payer: Commercial Managed Care - PPO

## 2020-05-10 DIAGNOSIS — J309 Allergic rhinitis, unspecified: Secondary | ICD-10-CM

## 2020-05-11 ENCOUNTER — Encounter: Payer: Self-pay | Admitting: Registered Nurse

## 2020-05-17 ENCOUNTER — Ambulatory Visit (INDEPENDENT_AMBULATORY_CARE_PROVIDER_SITE_OTHER): Payer: Commercial Managed Care - PPO | Admitting: *Deleted

## 2020-05-17 ENCOUNTER — Encounter: Payer: Self-pay | Admitting: Registered Nurse

## 2020-05-17 ENCOUNTER — Other Ambulatory Visit: Payer: Self-pay

## 2020-05-17 ENCOUNTER — Telehealth: Payer: Commercial Managed Care - PPO | Admitting: Registered Nurse

## 2020-05-17 DIAGNOSIS — F4321 Adjustment disorder with depressed mood: Secondary | ICD-10-CM | POA: Diagnosis not present

## 2020-05-17 DIAGNOSIS — J309 Allergic rhinitis, unspecified: Secondary | ICD-10-CM | POA: Diagnosis not present

## 2020-05-17 MED ORDER — VENLAFAXINE HCL ER 37.5 MG PO CP24: 37.5000 mg | ORAL_CAPSULE | Freq: Every day | ORAL | 0 refills | Status: DC

## 2020-05-17 NOTE — Patient Instructions (Signed)
° ° ° °  If you have lab work done today you will be contacted with your lab results within the next 2 weeks.  If you have not heard from us then please contact us. The fastest way to get your results is to register for My Chart. ° ° °IF you received an x-ray today, you will receive an invoice from [facility]. Please contact [facility] at [phone] with questions or concerns regarding your invoice.  ° °IF you received labwork today, you will receive an invoice from [facility]. Please contact [facility] at [phone] with questions or concerns regarding your invoice.  ° °Our billing staff will not be able to assist you with questions regarding bills from these companies. ° °You will be contacted with the lab results as soon as they are available. The fastest way to get your results is to activate your My Chart account. Instructions are located on the last page of this paperwork. If you have not heard from us regarding the results in 2 weeks, please contact this office. °  ° ° ° °

## 2020-05-17 NOTE — Progress Notes (Addendum)
Telemedicine Encounter- SOAP NOTE Established Patient  This telephone encounter was conducted with the patient's (or proxy's) verbal consent via audio telecommunications: yes  Patient was instructed to have this encounter in a suitably private space; and to only have persons present to whom they give permission to participate. In addition, patient identity was confirmed by use of name plus two identifiers (DOB and address).  I discussed the limitations, risks, security and privacy concerns of performing an evaluation and management service by telephone and the availability of in person appointments. I also discussed with the patient that there may be a patient responsible charge related to this service. The patient expressed understanding and agreed to proceed.  I spent a total of 25 minutes talking with the patient or their proxy.  Patient at home Provider in office  Chief Complaint  Patient presents with  . Medication Management    Patient would like to discuss a medication change for depression. Per patient she has been having constipation and needs something different/.  . Referral    Patient would like and referral for urologist     Subjective   Judy [name] is a 24 y.o. established patient. Telephone visit today for depression  HPI Pt has had depression for some time - on and off - has seen therapists in the past, not currently seeing one Has had trouble getting onto medication for depression as she has always been on preventative treatment for her chronic migraines with triptans, tcas, and other agents that would interact with SSRIs. However, now has started on zonegran and is hoping there are options. Still taking naratriptan as abortive therapy when needed - frequency of this tbd based on effect of zonegran, which she had just started within the past week Denies hi/si Notes that her sister has been taking effexor which has proven to be effective and limited in AEs - though  she states her sister suffers more from anxiety than depression  Patient Active Problem List   Diagnosis Date Noted  . Mild persistent asthma without complication 12/11/2018  . Unspecified asthma(493.90) 04/20/2013  . Cat allergies 04/20/2013  . Seasonal and perennial allergic rhinoconjunctivitis 04/20/2013    Past Medical History:  Diagnosis Date  . Acne    tx with spironolactone  . Allergy    Has allergies to dogs and cats.  . Anxiety    no meds  . Asthma    as a child, rarely uses inhaler  . Cat allergies 04/20/2013  . Depression    no meds  . GERD (gastroesophageal reflux disease)    occasional  . Migraine headache with aura   . Recurrent UTI     Current Outpatient Medications  Medication Sig Dispense Refill  . albuterol (VENTOLIN HFA) 108 (90 Base) MCG/ACT inhaler Inhale 2 puffs into the lungs every 4 (four) hours as needed for wheezing or shortness of breath. 18 g 1  . cetirizine (ZYRTEC) 10 MG tablet Take 1 tablet (10 mg total) by mouth daily as needed (for allergies.). 30 tablet 2  . EPINEPHrine 0.3 mg/0.3 mL IJ SOAJ injection Inject 0.3 mLs (0.3 mg total) into the muscle once as needed for anaphylaxis. 2 each 1  . Etonogestrel (NEXPLANON [place]) Inject into the skin.     . famotidine (PEPCID) 20 MG tablet Take 1 tablet (20 mg total) by mouth 2 (two) times daily as needed for heartburn or indigestion. 60 tablet 1  . Naphazoline-Pheniramine (OPCON-A) 0.027-0.315 % SOLN Place 1 drop into both  eyes 3 (three) times daily as needed (for irritated/itchy eyes.).    Marland Kitchen naratriptan (AMERGE) 2.5 MG tablet Take 1 mg by mouth as needed.    Marland Kitchen QVAR REDIHALER 80 MCG/ACT inhaler Inhale 2 puffs into the lungs 2 (two) times daily. Rinse mouth after each use. 1 each 5  . meclizine (ANTIVERT) 12.5 MG tablet Take 1 tablet (12.5 mg total) by mouth 2 (two) times daily as needed for dizziness. (Patient not taking: Reported on 05/17/2020) 30 tablet 0  . venlafaxine XR (EFFEXOR XR) 37.5 MG 24 hr  capsule Take 1 capsule (37.5 mg total) by mouth daily with breakfast. 90 capsule 0  . zonisamide (ZONEGRAN) 25 MG capsule Take by mouth.     No current facility-administered medications for this visit.    Allergies  Allergen Reactions  . Other Hives, Itching and Shortness Of Breath  . Pollen Extract Other (See Comments)    Sneezing/itchy eyes/runny nose    Social History   Socioeconomic History  . Marital status: Single    Spouse name: Not on file  . Number of children: Not on file  . Years of education: Not on file  . Highest education level: Not on file  Occupational History  . Not on file  Tobacco Use  . Smoking status: Never Smoker  . Smokeless tobacco: Never Used  Vaping Use  . Vaping Use: Never used  Substance and Sexual Activity  . Alcohol use: No  . Drug use: No  . Sexual activity: Not Currently    Birth control/protection: Abstinence    Comment: female partner  Other Topics Concern  . Not on file  Social History Narrative  . Not on file   Social Determinants of Health   Financial Resource Strain:   . Difficulty of Paying Living Expenses: Not on file  Food Insecurity:   . Worried About Programme researcher, broadcasting/film/video in the Last Year: Not on file  . Ran Out of Food in the Last Year: Not on file  Transportation Needs:   . Lack of Transportation (Medical): Not on file  . Lack of Transportation (Non-Medical): Not on file  Physical Activity:   . Days of Exercise per Week: Not on file  . Minutes of Exercise per Session: Not on file  Stress:   . Feeling of Stress : Not on file  Social Connections:   . Frequency of Communication with Friends and Family: Not on file  . Frequency of Social Gatherings with Friends and Family: Not on file  . Attends Religious Services: Not on file  . Active Member of Clubs or Organizations: Not on file  . Attends Banker Meetings: Not on file  . Marital Status: Not on file  Intimate Partner Violence:   . Fear of Current or  Ex-Partner: Not on file  . Emotionally Abused: Not on file  . Physically Abused: Not on file  . Sexually Abused: Not on file    Review of Systems  Constitutional: Negative.   HENT: Negative.   Eyes: Negative.   Respiratory: Negative.   Cardiovascular: Negative.   Gastrointestinal: Negative.   Genitourinary: Negative.   Musculoskeletal: Negative.   Skin: Negative.   Neurological: Negative.   Endo/Heme/Allergies: Negative.   Psychiatric/Behavioral: Positive for depression. Negative for hallucinations, memory loss, substance abuse and suicidal ideas. The patient is not nervous/anxious and does not have insomnia.     Objective   Vitals as reported by the patient: There were no vitals filed for this visit.  Cherye was seen today for medication management and referral.  Diagnoses and all orders for this visit:  Adjustment disorder with depressed mood -     venlafaxine XR (EFFEXOR XR) 37.5 MG 24 hr capsule; Take 1 capsule (37.5 mg total) by mouth daily with breakfast.   PLAN  Start effexor 37.5mg  PO qd. Take in mornings. After 1-2 weeks may increase to 75mg  PO qd  Med check in 4-6 weeks  Discussed r/b/se of this medication with patient who voiced understanding.   Patient encouraged to call clinic with any questions, comments, or concerns.   I discussed the assessment and treatment plan with the patient. The patient was provided an opportunity to ask questions and all were answered. The patient agreed with the plan and demonstrated an understanding of the instructions.   The patient was advised to call back or seek an in-person evaluation if the symptoms worsen or if the condition fails to improve as anticipated.  I provided 25 minutes of non-face-to-face time during this encounter.  , NP  Primary Care at [facility]

## 2020-05-20 ENCOUNTER — Telehealth: Payer: Commercial Managed Care - PPO | Admitting: Registered Nurse

## 2020-05-20 ENCOUNTER — Other Ambulatory Visit: Payer: Self-pay

## 2020-05-20 ENCOUNTER — Telehealth (INDEPENDENT_AMBULATORY_CARE_PROVIDER_SITE_OTHER): Payer: Commercial Managed Care - PPO | Admitting: Registered Nurse

## 2020-05-20 DIAGNOSIS — M26623 Arthralgia of bilateral temporomandibular joint: Secondary | ICD-10-CM

## 2020-05-20 MED ORDER — CYCLOBENZAPRINE HCL 5 MG PO TABS: 5.0000 mg | ORAL_TABLET | Freq: Three times a day (TID) | ORAL | 1 refills | Status: DC | PRN

## 2020-05-20 NOTE — Patient Instructions (Signed)
° ° ° °  If you have lab work done today you will be contacted with your lab results within the next 2 weeks.  If you have not heard from us then please contact us. The fastest way to get your results is to register for My Chart. ° ° °IF you received an x-ray today, you will receive an invoice from [facility]. Please contact [facility] at [phone] with questions or concerns regarding your invoice.  ° °IF you received labwork today, you will receive an invoice from [facility]. Please contact [facility] at [phone] with questions or concerns regarding your invoice.  ° °Our billing staff will not be able to assist you with questions regarding bills from these companies. ° °You will be contacted with the lab results as soon as they are available. The fastest way to get your results is to activate your My Chart account. Instructions are located on the last page of this paperwork. If you have not heard from us regarding the results in 2 weeks, please contact this office. °  ° ° ° °

## 2020-05-22 NOTE — Progress Notes (Signed)
Telemedicine Encounter- SOAP NOTE Established Patient  This telephone encounter was conducted with the patient's (or proxy's) verbal consent via audio telecommunications: yes  Patient was instructed to have this encounter in a suitably private space; and to only have persons present to whom they give permission to participate. In addition, patient identity was confirmed by use of name plus two identifiers (DOB and address).  I discussed the limitations, risks, security and privacy concerns of performing an evaluation and management service by telephone and the availability of in person appointments. I also discussed with the patient that there may be a patient responsible charge related to this service. The patient expressed understanding and agreed to proceed.  I spent a total of 15 minutes talking with the patient or their proxy.  Patient at home Provider in office  Chief Complaint  Patient presents with  . Jaw Pain    Patient states she has been having some jaw pain in the mornings even after wearing a night guard from grinding her teeth at night. Per patient her jaw is popping and it uncomfortable.    Subjective   Judy [name] is a 24 y.o. established patient. Telephone visit today for jaw pain  HPI bilat tmj pain Worse on waking May grind teeth - does have night guard occ crepitus, has been locked open once before No issues chewing, no dysphagia, no poor po intake  We are currently working to optimize treatment for her depression/anxiety  Patient Active Problem List   Diagnosis Date Noted  . Mild persistent asthma without complication 12/11/2018  . Unspecified asthma(493.90) 04/20/2013  . Cat allergies 04/20/2013  . Seasonal and perennial allergic rhinoconjunctivitis 04/20/2013    Past Medical History:  Diagnosis Date  . Acne    tx with spironolactone  . Allergy    Has allergies to dogs and cats.  . Anxiety    no meds  . Asthma    as a child, rarely uses  inhaler  . Cat allergies 04/20/2013  . Depression    no meds  . Depression    [employer] 05/20/2020  . GERD (gastroesophageal reflux disease)    occasional  . Migraine headache with aura   . Recurrent UTI     Current Outpatient Medications  Medication Sig Dispense Refill  . albuterol (VENTOLIN HFA) 108 (90 Base) MCG/ACT inhaler Inhale 2 puffs into the lungs every 4 (four) hours as needed for wheezing or shortness of breath. 18 g 1  . cetirizine (ZYRTEC) 10 MG tablet Take 1 tablet (10 mg total) by mouth daily as needed (for allergies.). 30 tablet 2  . EPINEPHrine 0.3 mg/0.3 mL IJ SOAJ injection Inject 0.3 mLs (0.3 mg total) into the muscle once as needed for anaphylaxis. 2 each 1  . Etonogestrel (NEXPLANON [place]) Inject into the skin.     . famotidine (PEPCID) 20 MG tablet Take 1 tablet (20 mg total) by mouth 2 (two) times daily as needed for heartburn or indigestion. 60 tablet 1  . meclizine (ANTIVERT) 12.5 MG tablet Take 1 tablet (12.5 mg total) by mouth 2 (two) times daily as needed for dizziness. 30 tablet 0  . Naphazoline-Pheniramine (OPCON-A) 0.027-0.315 % SOLN Place 1 drop into both eyes 3 (three) times daily as needed (for irritated/itchy eyes.).    Marland Kitchen naratriptan (AMERGE) 2.5 MG tablet Take 1 mg by mouth as needed.    Marland Kitchen QVAR REDIHALER 80 MCG/ACT inhaler Inhale 2 puffs into the lungs 2 (two) times daily. Rinse mouth after each  use. 1 each 5  . venlafaxine XR (EFFEXOR XR) 37.5 MG 24 hr capsule Take 1 capsule (37.5 mg total) by mouth daily with breakfast. 90 capsule 0  . zonisamide (ZONEGRAN) 25 MG capsule Take by mouth.    . cyclobenzaprine (FLEXERIL) 5 MG tablet Take 1 tablet (5 mg total) by mouth 3 (three) times daily as needed for muscle spasms. 30 tablet 1   No current facility-administered medications for this visit.    Allergies  Allergen Reactions  . Other Hives, Itching and Shortness Of Breath  . Pollen Extract Other (See Comments)    Sneezing/itchy eyes/runny nose     Social History   Socioeconomic History  . Marital status: Single    Spouse name: Not on file  . Number of children: Not on file  . Years of education: Not on file  . Highest education level: Not on file  Occupational History  . Not on file  Tobacco Use  . Smoking status: Never Smoker  . Smokeless tobacco: Never Used  Vaping Use  . Vaping Use: Never used  Substance and Sexual Activity  . Alcohol use: No  . Drug use: No  . Sexual activity: Not Currently    Birth control/protection: Abstinence    Comment: female partner  Other Topics Concern  . Not on file  Social History Narrative  . Not on file   Social Determinants of Health   Financial Resource Strain: Not on file  Food Insecurity: Not on file  Transportation Needs: Not on file  Physical Activity: Not on file  Stress: Not on file  Social Connections: Not on file  Intimate Partner Violence: Not on file    ROS Per hpi   Objective   Vitals as reported by the patient: There were no vitals filed for this visit.  Merilynn was seen today for jaw pain.  Diagnoses and all orders for this visit:  Bilateral temporomandibular joint pain -     cyclobenzaprine (FLEXERIL) 5 MG tablet; Take 1 tablet (5 mg total) by mouth 3 (three) times daily as needed for muscle spasms. -     Ambulatory referral to Physical Therapy   PLAN  Flexeril 5mg  PO qhs PRN  Refer to PT  Return prn  Continue night guard  Patient encouraged to call clinic with any questions, comments, or concerns.  I discussed the assessment and treatment plan with the patient. The patient was provided an opportunity to ask questions and all were answered. The patient agreed with the plan and demonstrated an understanding of the instructions.   The patient was advised to call back or seek an in-person evaluation if the symptoms worsen or if the condition fails to improve as anticipated.  I provided 15 minutes of non-face-to-face time during this  encounter.  , NP  Primary Care at [facility]

## 2020-05-26 ENCOUNTER — Ambulatory Visit: Payer: Commercial Managed Care - PPO | Admitting: Family Medicine

## 2020-05-26 ENCOUNTER — Encounter: Payer: Self-pay | Admitting: Family Medicine

## 2020-05-26 ENCOUNTER — Other Ambulatory Visit: Payer: Self-pay

## 2020-05-26 ENCOUNTER — Telehealth: Payer: Self-pay

## 2020-05-26 VITALS — BP 110/58 | HR 84

## 2020-05-26 DIAGNOSIS — K219 Gastro-esophageal reflux disease without esophagitis: Secondary | ICD-10-CM | POA: Insufficient documentation

## 2020-05-26 DIAGNOSIS — J4540 Moderate persistent asthma, uncomplicated: Secondary | ICD-10-CM | POA: Insufficient documentation

## 2020-05-26 DIAGNOSIS — H1013 Acute atopic conjunctivitis, bilateral: Secondary | ICD-10-CM

## 2020-05-26 DIAGNOSIS — H1010 Acute atopic conjunctivitis, unspecified eye: Secondary | ICD-10-CM

## 2020-05-26 DIAGNOSIS — J3089 Other allergic rhinitis: Secondary | ICD-10-CM

## 2020-05-26 DIAGNOSIS — J302 Other seasonal allergic rhinitis: Secondary | ICD-10-CM | POA: Diagnosis not present

## 2020-05-26 MED ORDER — MONTELUKAST SODIUM 10 MG PO TABS: 10.0000 mg | ORAL_TABLET | Freq: Every day | ORAL | 5 refills | Status: DC

## 2020-05-26 MED ORDER — BUDESONIDE-FORMOTEROL FUMARATE 80-4.5 MCG/ACT IN AERO: 2.0000 | INHALATION_SPRAY | Freq: Two times a day (BID) | RESPIRATORY_TRACT | 5 refills | Status: DC

## 2020-05-26 NOTE — Telephone Encounter (Signed)
Patient called and states she is having trouble breathing that got worse today. Her PCP prescribe her some prednisone for 5 days and just got done with the course. She states she does not feel any better and had to use her rescue inhaler 5 times today. She states she has not been using her Qvar up until now that she is having this issues. She would like to know of there is anything else we can send in for her. Please advice

## 2020-05-26 NOTE — Patient Instructions (Addendum)
Asthma Begin montelukast 10 mg once a day to prevent cough or wheeze.  Begin Symbicort 80-2 puffs twice a day with a spacer to prevent cough or wheeze. This will replace Qvar 80 Continue albuterol 2 puffs once every 4 hours as needed for cough or wheeze You may use albuterol 1 to 2 puffs 5 to 15 minutes before activity to decrease cough or wheeze  Allergic rhinitis Begin Flonase 1 to 2 sprays in each nostril once a day as needed for a stuffy nose.  In the right nostril, point the applicator out toward the right ear. In the left nostril, point the applicator out toward the left ear Begin saline nasal rinses as needed for nasal symptoms. Use this before any medicated nasal sprays for best result Stop cetirizine. After about 1 week, begin Xyzal (levocetirizine) 5 mg once a day as needed for runny nose or itch. This will replace Zyrtec (cetirizine). Remember to rotate to a different antihistamine about every 3 months. Some examples of over the counter antihistamines include Zyrtec (cetirizine), Xyzal (levocetirizine), Allegra (fexofenadine), and Claritin (loratidine).  Begin Mucinex [phone] mg twice a day and increase fluid intake as tolerated Continue allergen avoidance measures directed toward pollens, mold, dust mite, cat, and dog When you are feeling well, continue allergen immunotherapy have access to a set of epinephrine auto-injectors  Allergic conjunctivitis Continue Opcon A one drop in each eye up to three times a day as needed for red, itchy eyes. Some over the counter eye drops include Pataday one drop in each eye once a day as needed for red, itchy eyes OR Zaditor one drop in each eye twice a day as needed for red itchy eyes.  Reflux Begin famotidine 20 mg once a day to control reflux Continue dietary and lifestyle modifications as listed below  Call the clinic if this treatment plan is not working well for you  Follow up in 2 months or sooner if needed.

## 2020-05-26 NOTE — Progress Notes (Signed)
[address] [place] [address] Dept: [phone]  FOLLOW UP NOTE  Patient ID: Judy [name], female    DOB: 1995/10/31  Age: 24 y.o. MRN: [id number] Date of Office Visit: 05/26/2020  Assessment  Chief Complaint: Shortness of Breath (Woke up this morning with a cough, she had a televisit with [facility] this past Sunday and was given prednisone for 5 days. )  HPI Judy [name] is a 24 year old female who presents to the clinic for an evaluation of acute asthma exacerbation. She was last seen in this clinic on 02/17/2020 by Dr. Selena Batten for evaluation of asthma, allergic rhinitis, allergic conjunctivitis, and reflux. In the interim, she began to experience shortness of breath, cough producing clear mucus, and nasal congestion that began on Sunday.  At this time, she restarted her Qvar 80-2 puffs twice a day and also was prescribed prednisone 50 mg once a day for 9 days.  She reports this did help symptoms improve, however, she has taken the last prednisone tablet today.  At today's visit, she reports that she continues to experience shortness of breath especially with activity, and intermittent wheeze occurring throughout the day, and cough producing clear mucus.  She denies fever, sweats, chills, and sick contacts.  She continues Qvar 80-2 puffs twice a day that she began on Sunday and is using albuterol several times a day beginning on Sunday.  Prior to Sunday she has not used Qvar 80 in about 1 month and she was using albuterol about 1-3 times a week.  Allergic rhinitis is reported as poorly controlled with symptoms including nasal congestion, clear thick rhinorrhea, sneezing, and postnasal drainage that all began on Sunday.  She is currently taking Zyrtec 10 mg once a day and is not using nasal saline rinses or nasal steroid spray.  She reports Zyrtec has not been relieving her allergic rhinitis for quite some time.  She continues allergen immunotherapy with mild local reactions.  Allergic conjunctivitis  is reported as moderately well controlled with symptoms including red, itchy, watery eyes for which she uses Opcon-A with relief of symptoms.  Reflux is reported as well controlled with heartburn occurring infrequently for which she takes famotidine 20 mg as needed.  Of note, she was diagnosed with Covid on 03/02/2020.  She does report that she has received 2 Pfizer Covid vaccines and a [employer] booster vaccine.  Her current medications are listed in the chart.   Drug Allergies:  Allergies  Allergen Reactions  . Other Hives, Itching and Shortness Of Breath  . Pollen Extract Other (See Comments)    Sneezing/itchy eyes/runny nose    Physical Exam: BP (!) 110/58 (BP Location: Right Arm, Patient Position: Sitting, Cuff Size: Normal)   Pulse 84   SpO2 97%    Physical Exam Vitals reviewed.  Constitutional:      Appearance: She is well-developed.  HENT:     Head: Normocephalic and atraumatic.     Right Ear: Tympanic membrane normal.     Left Ear: Tympanic membrane normal.     Nose:     Comments: Bilateral nares slightly erythematous with clear nasal drainage noted.  Pharynx normal.  Ears normal.  Eyes normal.    Mouth/Throat:     Pharynx: Oropharynx is clear.  Eyes:     Extraocular Movements: Extraocular movements intact.     Pupils: Pupils are equal, round, and reactive to light.  Cardiovascular:     Rate and Rhythm: Normal rate and regular rhythm.  Heart sounds: Normal heart sounds. No murmur heard.   Pulmonary:     Effort: Pulmonary effort is normal.     Breath sounds: Normal breath sounds.     Comments: Lungs clear to auscultation Musculoskeletal:        General: Normal range of motion.     Cervical back: Normal range of motion and neck supple.  Skin:    General: Skin is warm and dry.  Neurological:     Mental Status: She is alert and oriented to person, place, and time.  Psychiatric:        Mood and Affect: Mood normal.        Behavior: Behavior normal.         Thought Content: Thought content normal.        Judgment: Judgment normal.     Diagnostics: FVC 4.41, FEV1 3.79.  Predicted FVC 3.90, predicted FEV1 3.39.  Indicates normal ventilatory function.  Assessment and Plan: 1. Moderate persistent asthma, unspecified whether complicated   2. Seasonal and perennial allergic rhinoconjunctivitis   3. Allergic conjunctivitis of both eyes   4. Gastroesophageal reflux disease, unspecified whether esophagitis present     Meds ordered this encounter  Medications  . montelukast (SINGULAIR) 10 MG tablet    Sig: Take 1 tablet (10 mg total) by mouth at bedtime.    Dispense:  30 tablet    Refill:  5  . budesonide-formoterol (SYMBICORT) 80-4.5 MCG/ACT inhaler    Sig: Inhale 2 puffs into the lungs 2 (two) times daily.    Dispense:  1 each    Refill:  5    Patient Instructions  Asthma Begin montelukast 10 mg once a day to prevent cough or wheeze.  Begin Symbicort 80-2 puffs twice a day with a spacer to prevent cough or wheeze. This will replace Qvar 80 Continue albuterol 2 puffs once every 4 hours as needed for cough or wheeze You may use albuterol 1 to 2 puffs 5 to 15 minutes before activity to decrease cough or wheeze  Allergic rhinitis Begin Flonase 1 to 2 sprays in each nostril once a day as needed for a stuffy nose.  In the right nostril, point the applicator out toward the right ear. In the left nostril, point the applicator out toward the left ear Begin saline nasal rinses as needed for nasal symptoms. Use this before any medicated nasal sprays for best result Stop cetirizine. After about 1 week, begin Xyzal (levocetirizine) 5 mg once a day as needed for runny nose or itch. This will replace Zyrtec (cetirizine). Remember to rotate to a different antihistamine about every 3 months. Some examples of over the counter antihistamines include Zyrtec (cetirizine), Xyzal (levocetirizine), Allegra (fexofenadine), and Claritin (loratidine).  Begin Mucinex  [phone] mg twice a day and increase fluid intake as tolerated Continue allergen avoidance measures directed toward pollens, mold, dust mite, cat, and dog When you are feeling well, continue allergen immunotherapy have access to a set of epinephrine auto-injectors  Allergic conjunctivitis Continue Opcon A one drop in each eye up to three times a day as needed for red, itchy eyes. Some over the counter eye drops include Pataday one drop in each eye once a day as needed for red, itchy eyes OR Zaditor one drop in each eye twice a day as needed for red itchy eyes.  Reflux Begin famotidine 20 mg once a day to control reflux Continue dietary and lifestyle modifications as listed below  Call the clinic if this treatment  plan is not working well for you  Follow up in 2 months or sooner if needed.   Return in about 2 months (around 07/27/2020), or if symptoms worsen or fail to improve.    Thank you for the opportunity to care for this patient.  Please do not hesitate to contact me with questions.  Thermon Leyland, FNP [facility]

## 2020-05-26 NOTE — Telephone Encounter (Signed)
Had to use inhaler 4 times today, she had a cough this morning had a televisit with [facility]. She's coming in today to see Dewayne Hatch.

## 2020-05-26 NOTE — Telephone Encounter (Signed)
Please call patient.   Can she come in today to [facility] under Anne's schedule for evaluation? Better if in person. But an do televisit as well.

## 2020-05-26 NOTE — Addendum Note (Signed)
Addended by: Dub Mikes on: 05/26/2020 05:20 PM   Modules accepted: Orders

## 2020-05-27 ENCOUNTER — Other Ambulatory Visit: Payer: Self-pay | Admitting: Allergy

## 2020-05-27 DIAGNOSIS — J4530 Mild persistent asthma, uncomplicated: Secondary | ICD-10-CM

## 2020-05-30 ENCOUNTER — Ambulatory Visit: Payer: Commercial Managed Care - PPO | Admitting: Allergy

## 2020-06-15 ENCOUNTER — Encounter: Payer: Commercial Managed Care - PPO | Admitting: Emergency Medicine

## 2020-06-18 ENCOUNTER — Other Ambulatory Visit: Payer: Self-pay | Admitting: Family Medicine

## 2020-06-18 DIAGNOSIS — J4530 Mild persistent asthma, uncomplicated: Secondary | ICD-10-CM

## 2020-06-22 ENCOUNTER — Ambulatory Visit: Payer: Commercial Managed Care - PPO | Admitting: Allergy

## 2020-07-07 ENCOUNTER — Telehealth: Payer: Self-pay | Admitting: Registered Nurse

## 2020-07-07 NOTE — Telephone Encounter (Signed)
Flexeril making pt drowsy, PT requesting you send non drowsy med for pain

## 2020-07-07 NOTE — Telephone Encounter (Signed)
PT calling in stating that the cyclobenzaprine (FLEXERIL) 5 MG tablet that she was previously perscribed is causing her to be overly sleepy during the day so she is hesitant to take it. PT is requesting to have a  Non drowsy medication sent over to her pharmacy to help with her jaw pain. PT states her jaw feels very tight and unstable. PT states it is hard for her to eat.  [id number] [place], [place] - [address] AT [facility] [address] Phone:  [phone]  Fax:  [phone]

## 2020-07-12 ENCOUNTER — Other Ambulatory Visit: Payer: Self-pay

## 2020-07-12 ENCOUNTER — Ambulatory Visit: Payer: Commercial Managed Care - PPO | Admitting: Registered Nurse

## 2020-07-12 ENCOUNTER — Encounter: Payer: Self-pay | Admitting: Registered Nurse

## 2020-07-12 VITALS — BP 120/77 | HR 100 | Temp 98.5°F | Resp 18 | Ht 65.0 in | Wt 127.9 lb

## 2020-07-12 DIAGNOSIS — M26623 Arthralgia of bilateral temporomandibular joint: Secondary | ICD-10-CM | POA: Diagnosis not present

## 2020-07-12 DIAGNOSIS — F4321 Adjustment disorder with depressed mood: Secondary | ICD-10-CM

## 2020-07-12 DIAGNOSIS — G43109 Migraine with aura, not intractable, without status migrainosus: Secondary | ICD-10-CM | POA: Diagnosis not present

## 2020-07-12 NOTE — Patient Instructions (Signed)
° ° ° °  If you have lab work done today you will be contacted with your lab results within the next 2 weeks.  If you have not heard from us then please contact us. The fastest way to get your results is to register for My Chart. ° ° °IF you received an x-ray today, you will receive an invoice from [facility]. Please contact [facility] at [phone] with questions or concerns regarding your invoice.  ° °IF you received labwork today, you will receive an invoice from [facility]. Please contact [facility] at [phone] with questions or concerns regarding your invoice.  ° °Our billing staff will not be able to assist you with questions regarding bills from these companies. ° °You will be contacted with the lab results as soon as they are available. The fastest way to get your results is to activate your My Chart account. Instructions are located on the last page of this paperwork. If you have not heard from us regarding the results in 2 weeks, please contact this office. °  ° ° ° °

## 2020-07-13 ENCOUNTER — Telehealth: Payer: Self-pay

## 2020-07-13 NOTE — Telephone Encounter (Signed)
Yes. Let's just restart blue 0.05cc.  Thank you.

## 2020-07-13 NOTE — Telephone Encounter (Signed)
Patient is scheduled to restart injections on 07/27/20 when she sees you in office. She had 0.05cc of gold vials on 05/17/2020. Do you want to just restart blue 0.05cc since it has been over 8 weeks as well as her history of locals?

## 2020-07-13 NOTE — Telephone Encounter (Signed)
Blue vials mixed down from gold.

## 2020-07-18 ENCOUNTER — Telehealth: Payer: Self-pay | Admitting: Registered Nurse

## 2020-07-18 ENCOUNTER — Other Ambulatory Visit: Payer: Self-pay | Admitting: Registered Nurse

## 2020-07-18 NOTE — Telephone Encounter (Signed)
07/18/2020 - PATIENT STATES SHE SAW RICH MORROW ON 07/12/2020. HE HAS NEVER SENT IN HER MEDICATIONS FOR FLONASE, SKELAXIN, HER ANTIDEPRESSANT AND A CREAM FOR HER HEMORRHOIDS. BEST PHONE [phone] (CELL)  [facility] [address]   MBC

## 2020-07-21 NOTE — Telephone Encounter (Signed)
Patient is calling back again needs her refills    Please advise

## 2020-07-25 ENCOUNTER — Ambulatory Visit: Payer: Self-pay | Admitting: Obstetrics and Gynecology

## 2020-07-25 ENCOUNTER — Encounter: Payer: Self-pay | Admitting: Registered Nurse

## 2020-07-26 ENCOUNTER — Other Ambulatory Visit: Payer: Self-pay | Admitting: Registered Nurse

## 2020-07-26 DIAGNOSIS — J302 Other seasonal allergic rhinitis: Secondary | ICD-10-CM

## 2020-07-26 DIAGNOSIS — M26623 Arthralgia of bilateral temporomandibular joint: Secondary | ICD-10-CM

## 2020-07-26 DIAGNOSIS — K649 Unspecified hemorrhoids: Secondary | ICD-10-CM

## 2020-07-26 MED ORDER — METAXALONE 800 MG PO TABS: 800.0000 mg | ORAL_TABLET | Freq: Three times a day (TID) | ORAL | 0 refills | Status: DC

## 2020-07-26 MED ORDER — HYDROCORTISONE (PERIANAL) 2.5 % EX CREA
1.0000 | TOPICAL_CREAM | Freq: Two times a day (BID) | CUTANEOUS | 0 refills | Status: DC
Start: 2020-07-26 — End: 2020-12-26

## 2020-07-26 MED ORDER — CYCLOBENZAPRINE HCL 5 MG PO TABS: 5.0000 mg | ORAL_TABLET | Freq: Three times a day (TID) | ORAL | 1 refills | Status: DC | PRN

## 2020-07-26 MED ORDER — METHOCARBAMOL 500 MG PO TABS: 500.0000 mg | ORAL_TABLET | Freq: Four times a day (QID) | ORAL | 0 refills | Status: DC

## 2020-07-26 MED ORDER — FLUTICASONE PROPIONATE 50 MCG/ACT NA SUSP
2.0000 | Freq: Every day | NASAL | 6 refills | Status: DC
Start: 2020-07-26 — End: 2020-08-19

## 2020-07-26 MED ORDER — BUTALBITAL-APAP-CAFFEINE 50-325-40 MG PO TABS: 1.0000 | ORAL_TABLET | Freq: Four times a day (QID) | ORAL | 0 refills | Status: DC | PRN

## 2020-07-26 MED ORDER — VENLAFAXINE HCL ER 37.5 MG PO CP24: 37.5000 mg | ORAL_CAPSULE | Freq: Every day | ORAL | 0 refills | Status: DC

## 2020-07-26 NOTE — Progress Notes (Deleted)
Follow Up Note  RE: Judy [name] MRN: [id number] DOB: [birth year] Date of Office Visit: 07/27/2020  Referring provider: [name], NP Primary care provider: [name], NP  Chief Complaint: No chief complaint on file.  History of Present Illness: I had the pleasure of seeing Airlie Blumenberg for a follow up visit at the [facility] on 07/26/2020. She is a 25 y.o. female, who is being followed for asthma, allergic rhinoconjunctivitis and reflux. Her previous allergy office visit was on 05/26/2020 with Thermon Leyland, FNP. Today is a regular follow up visit. ? AIT   Asthma Begin montelukast 10 mg once a day to prevent cough or wheeze.  Begin Symbicort 80-2 puffs twice a day with a spacer to prevent cough or wheeze. This will replace Qvar 80 Continue albuterol 2 puffs once every 4 hours as needed for cough or wheeze You may use albuterol 1 to 2 puffs 5 to 15 minutes before activity to decrease cough or wheeze  Allergic rhinitis Begin Flonase 1 to 2 sprays in each nostril once a day as needed for a stuffy nose.  In the right nostril, point the applicator out toward the right ear. In the left nostril, point the applicator out toward the left ear Begin saline nasal rinses as needed for nasal symptoms. Use this before any medicated nasal sprays for best result Stop cetirizine. After about 1 week, begin Xyzal (levocetirizine) 5 mg once a day as needed for runny nose or itch. This will replace Zyrtec (cetirizine). Remember to rotate to a different antihistamine about every 3 months. Some examples of over the counter antihistamines include Zyrtec (cetirizine), Xyzal (levocetirizine), Allegra (fexofenadine), and Claritin (loratidine).  Begin Mucinex [phone] mg twice a day and increase fluid intake as tolerated Continue allergen avoidance measures directed toward pollens, mold, dust mite, cat, and dog When you are feeling well, continue allergen immunotherapy have access to a set  of epinephrine auto-injectors  Allergic conjunctivitis Continue Opcon A one drop in each eye up to three times a day as needed for red, itchy eyes. Some over the counter eye drops include Pataday one drop in each eye once a day as needed for red, itchy eyes OR Zaditor one drop in each eye twice a day as needed for red itchy eyes.  Reflux Begin famotidine 20 mg once a day to control reflux Continue dietary and lifestyle modifications as listed below  Mild persistent asthma without complication Past history - Diagnosed with asthma over 10 years ago however the last few months noticed increasing symptoms.  She was restarted on Qvar 80 1 puff twice a day with good benefit.  Using albuterol once a week.  Triggers include allergies and exertion. Interim history - 2-3 episodes of using albuterol per month with good benefit. No specific triggers noted.   Today's spirometry was normal. ACT score 22.  Daily controller medication(s): Increase Qvar to 2 puffs twice a day and rinse mouth afterwards.  ? If this does not improve your shortness of breath episodes after 1-2 months then let us know.   Prior to physical activity:May use albuterol rescue inhaler 2 puffs 5 to 15 minutes prior to strenuous physical activities.  Rescue medications:May use albuterol rescue inhaler 2 puffs or nebulizer every 4 to 6 hours as needed for shortness of breath, chest tightness, coughing, and wheezing. Monitor frequency of use.   Repeat spirometry at next visit.   Seasonal and perennial allergic rhinoconjunctivitis Past history - Perennial rhinoconjunctivitis symptoms  for the past 10 years with worsening in the spring.  2020 skin testing showed: Positive to grass, weed, ragweed, tree, dust mites, cat, dog, mold. Interim history - Started AIT on 05/26/2019 (M-DM-C-D & G-RW-W-T). Doing well with below regimen. One episode of shortness of breath after allergy injections.  Continue allergy injections.  ? If you  notice shortness of breath episodes then let us know. ? You may take zyrtec 10mg  twice a day on the days of your injections.   Continue environmental control measures as below.   May use over the counter antihistamines such as Zyrtec (cetirizine), Claritin (loratadine), Allegra (fexofenadine), or Xyzal (levocetirizine) daily as needed.  May use Flonase 1 spray in each nostril 1-2 times a day as needed.  May use Opcon A eye drops as needed.   Assessment and Plan: Allyne is a 25 y.o. female with: No problem-specific Assessment & Plan notes found for this encounter.  No follow-ups on file.  No orders of the defined types were placed in this encounter.  Lab Orders  No laboratory test(s) ordered today    Diagnostics: Spirometry:  Tracings reviewed. Her effort: {Blank single:19197::"Good reproducible efforts.","It was hard to get consistent efforts and there is a question as to whether this reflects a maximal maneuver.","Poor effort, data can not be interpreted."} FVC: ***L FEV1: ***L, ***% predicted FEV1/FVC ratio: ***% Interpretation: {Blank single:19197::"Spirometry consistent with mild obstructive disease","Spirometry consistent with moderate obstructive disease","Spirometry consistent with severe obstructive disease","Spirometry consistent with possible restrictive disease","Spirometry consistent with mixed obstructive and restrictive disease","Spirometry uninterpretable due to technique","Spirometry consistent with normal pattern","No overt abnormalities noted given today's efforts"}.  Please see scanned spirometry results for details.  Skin Testing: {Blank single:19197::"Select foods","Environmental allergy panel","Environmental allergy panel and select foods","Food allergy panel","None","Deferred due to recent antihistamines use"}. Positive test to: ***. Negative test to: ***.  Results discussed with patient/family.   Medication List:  Current Outpatient Medications   Medication Sig Dispense Refill  . albuterol (VENTOLIN HFA) 108 (90 Base) MCG/ACT inhaler INHALE 2 PUFFS INTO THE LUNGS EVERY 4 HOURS AS NEEDED FOR WHEEZE OR FOR SHORTNESS OF BREATH 36 each 0  . budesonide-formoterol (SYMBICORT) 80-4.5 MCG/ACT inhaler Inhale 2 puffs into the lungs 2 (two) times daily. 1 each 5  . cetirizine (ZYRTEC) 10 MG tablet Take 1 tablet (10 mg total) by mouth daily as needed (for allergies.). 30 tablet 2  . EPINEPHrine 0.3 mg/0.3 mL IJ SOAJ injection Inject 0.3 mLs (0.3 mg total) into the muscle once as needed for anaphylaxis. 2 each 1  . Etonogestrel (NEXPLANON [place]) Inject into the skin.     . famotidine (PEPCID) 20 MG tablet Take 1 tablet (20 mg total) by mouth 2 (two) times daily as needed for heartburn or indigestion. 60 tablet 1  . montelukast (SINGULAIR) 10 MG tablet Take 1 tablet (10 mg total) by mouth at bedtime. 30 tablet 5  . Naphazoline-Pheniramine (OPCON-A) 0.027-0.315 % SOLN Place 1 drop into both eyes 3 (three) times daily as needed (for irritated/itchy eyes.).    10-15-1989 naratriptan (AMERGE) 2.5 MG tablet Take 1 mg by mouth as needed.    Marland Kitchen QVAR REDIHALER 80 MCG/ACT inhaler Inhale 2 puffs into the lungs 2 (two) times daily. Rinse mouth after each use. 1 each 5  . venlafaxine XR (EFFEXOR XR) 37.5 MG 24 hr capsule Take 1 capsule (37.5 mg total) by mouth daily with breakfast. 90 capsule 0  . zonisamide (ZONEGRAN) 25 MG capsule Take by mouth.     No current facility-administered medications  for this visit.   Allergies: Allergies  Allergen Reactions  . Other Hives, Itching and Shortness Of Breath  . Pollen Extract Other (See Comments)    Sneezing/itchy eyes/runny nose   I reviewed her past medical history, social history, family history, and environmental history and no significant changes have been reported from her previous visit.  Review of Systems  Constitutional: Negative for appetite change, chills, fever and unexpected weight change.  HENT: Negative for  congestion and rhinorrhea.   Eyes: Negative for itching.  Respiratory: Positive for shortness of breath. Negative for cough, chest tightness and wheezing.   Cardiovascular: Negative for chest pain.  Gastrointestinal: Negative for abdominal pain.  Genitourinary: Negative for difficulty urinating.  Skin: Negative for rash.  Allergic/Immunologic: Positive for environmental allergies. Negative for food allergies.  Neurological: Positive for headaches.   Objective: There were no vitals taken for this visit. There is no height or weight on file to calculate BMI. Physical Exam Vitals and nursing note reviewed.  Constitutional:      Appearance: Normal appearance. She is well-developed.  HENT:     Head: Normocephalic and atraumatic.     Right Ear: External ear normal.     Left Ear: External ear normal.     Nose: Nose normal.     Mouth/Throat:     Mouth: Mucous membranes are moist.     Pharynx: Oropharynx is clear.  Eyes:     Conjunctiva/sclera: Conjunctivae normal.  Cardiovascular:     Rate and Rhythm: Normal rate and regular rhythm.     Heart sounds: Normal heart sounds. No murmur heard. No friction rub. No gallop.   Pulmonary:     Effort: Pulmonary effort is normal.     Breath sounds: Normal breath sounds. No wheezing or rales.  Musculoskeletal:     Cervical back: Neck supple.  Skin:    General: Skin is warm.     Findings: No rash.  Neurological:     Mental Status: She is alert and oriented to person, place, and time.  Psychiatric:        Behavior: Behavior normal.    Previous notes and tests were reviewed. The plan was reviewed with the patient/family, and all questions/concerned were addressed.  It was my pleasure to see Easter today and participate in her care. Please feel free to contact me with any questions or concerns.  Sincerely,  Wyline Mood, DO Allergy & Immunology  [facility] office: [phone] [facility] office:  [phone]

## 2020-07-27 ENCOUNTER — Ambulatory Visit: Payer: Commercial Managed Care - PPO | Admitting: Allergy

## 2020-07-27 ENCOUNTER — Ambulatory Visit: Payer: Self-pay

## 2020-08-09 ENCOUNTER — Other Ambulatory Visit: Payer: Self-pay | Admitting: *Deleted

## 2020-08-09 MED ORDER — MONTELUKAST SODIUM 10 MG PO TABS: 10.0000 mg | ORAL_TABLET | Freq: Every day | ORAL | 1 refills | Status: DC

## 2020-08-11 ENCOUNTER — Other Ambulatory Visit: Payer: Self-pay | Admitting: *Deleted

## 2020-08-18 NOTE — Progress Notes (Signed)
Follow Up Note  RE: Judy [name] MRN: [id number] DOB: [birth year] Date of Office Visit: 08/19/2020  Referring provider: [name], NP Primary care provider: [name], NP  Chief Complaint: Asthma (Has some coughing and wheezing due to pollen )  History of Present Illness: I had the pleasure of seeing Judy [name] for a follow up visit at the [facility] on 08/19/2020. She is a 25 y.o. female, who is being followed for asthma, allergic rhinoconjunctivitis and reflux. Her previous allergy office visit was on 05/26/2020 with Thermon Leyland, FNP. Today is a regular follow up visit.  Asthma ACT score 20 Currently on Symbicort 2 puffs twice a day and rinsing mouth after each use. Only using albuterol a few times per month now which is good. This seems to work better than her ICS inhaler.   Allergic rhino conjunctivitis Patient's last injection was in December 2021 and stopped as she would frequently forget to get injections.   Patient is moving [place] new city [place] from [place].  She is also getting married in April in [place] in a [place] outdoors.   Takes OTC antihistamines once a day with some benefit. Takes Flonase 1 spray twice a day. No nosebleeds. Using OTC eye Opcon A with good benefit.  Reflux Stable and only using famotidine as needed.   Assessment and Plan: Judy [name] is a 25 y.o. female with: Moderate persistent asthma without complication Past history - Diagnosed with asthma over 10 years ago.Triggers include allergies and exertion. Interim history - doing much better with Symbicort.   Today's spirometry was normal. ACT score 20. . Daily controller medication(s): continue Symbicort 2 puffs twice a day and rinse mouth after each use.  . Continue Singulair (montelukast) 10mg  daily at night. . May use albuterol rescue inhaler 2 puffs every 4 to 6 hours as needed for shortness of breath, chest tightness,  coughing, and wheezing. May use albuterol rescue inhaler 2 puffs 5 to 15 minutes prior to strenuous physical activities. Monitor frequency of use.  Seasonal and perennial allergic rhinoconjunctivitis Past history - Perennial rhinoconjunctivitis symptoms for the past 10 years with worsening in the spring.  2020 skin testing showed: Positive to grass, weed, ragweed, tree, dust mites, cat, dog, mold. Started AIT on 05/26/2019 (M-DM-C-D & G-RW-W-T) Interim history - Stopped AIT in December 2021 due to scheduling. Patient is moving next month and getting married.   Continue environmental control measures.  Consider restarting allergy injections once she gets settled in the new area.   May use over the counter antihistamines such as Zyrtec (cetirizine), Claritin (loratadine), Allegra (fexofenadine), or Xyzal (levocetirizine) daily as needed. May take twice a day if needed.   May use Flonase (fluticasone) nasal spray 1 spray per nostril twice a day as needed for nasal congestion.   May use Opcon A eye drops as needed for itchy/watery eyes.  Gastroesophageal reflux disease Heartburn symptoms much better now and only using famotidine as needed.  May use famotidine 20mg  daily as needed.  See below lifestyle and dietary modifications.  Return if symptoms worsen or fail to improve.  Meds ordered this encounter  Medications  . budesonide-formoterol (SYMBICORT) 80-4.5 MCG/ACT inhaler    Sig: Inhale 2 puffs into the lungs 2 (two) times daily. Rinse mouth after each use.    Dispense:  3 each    Refill:  2  . fluticasone (FLONASE) 50 MCG/ACT nasal spray    Sig:  Place 1 spray into both nostrils 2 (two) times daily as needed for allergies or rhinitis.    Dispense:  48 g    Refill:  2  . montelukast (SINGULAIR) 10 MG tablet    Sig: Take 1 tablet (10 mg total) by mouth at bedtime.    Dispense:  90 tablet    Refill:  2   Lab Orders  No laboratory test(s) ordered today     Diagnostics: Spirometry:  Tracings reviewed. Her effort: Good reproducible efforts. FVC: 4.31L FEV1: 3.60L, 103% predicted FEV1/FVC ratio: 84% Interpretation: Spirometry consistent with normal pattern.  Please see scanned spirometry results for details.  Medication List:  Current Outpatient Medications  Medication Sig Dispense Refill  . albuterol (VENTOLIN HFA) 108 (90 Base) MCG/ACT inhaler INHALE 2 PUFFS INTO THE LUNGS EVERY 4 HOURS AS NEEDED FOR WHEEZE OR FOR SHORTNESS OF BREATH 36 each 0  . butalbital-acetaminophen-caffeine (FIORICET) 50-325-40 MG tablet Take 1-2 tablets by mouth every 6 (six) hours as needed for headache. 20 tablet 0  . cetirizine (ZYRTEC) 10 MG tablet Take 1 tablet (10 mg total) by mouth daily as needed (for allergies.). 30 tablet 2  . cyclobenzaprine (FLEXERIL) 5 MG tablet Take 1 tablet (5 mg total) by mouth 3 (three) times daily as needed for muscle spasms. 30 tablet 1  . EPINEPHrine 0.3 mg/0.3 mL IJ SOAJ injection Inject 0.3 mLs (0.3 mg total) into the muscle once as needed for anaphylaxis. 2 each 1  . Etonogestrel (NEXPLANON [place]) Inject into the skin.     . famotidine (PEPCID) 20 MG tablet Take 1 tablet (20 mg total) by mouth 2 (two) times daily as needed for heartburn or indigestion. 60 tablet 1  . hydrocortisone (ANUSOL-HC) 2.5 % rectal cream Place 1 application rectally 2 (two) times daily. 30 g 0  . metaxalone (SKELAXIN) 800 MG tablet Take 1 tablet (800 mg total) by mouth 3 (three) times daily. 90 tablet 0  . methocarbamol (ROBAXIN) 500 MG tablet Take 1 tablet (500 mg total) by mouth 4 (four) times daily. 60 tablet 0  . Naphazoline-Pheniramine (OPCON-A) 0.027-0.315 % SOLN Place 1 drop into both eyes 3 (three) times daily as needed (for irritated/itchy eyes.).    Marland Kitchen naratriptan (AMERGE) 2.5 MG tablet Take 1 mg by mouth as needed.    . venlafaxine XR (EFFEXOR XR) 37.5 MG 24 hr capsule Take 1 capsule (37.5 mg total) by mouth daily with breakfast. 90 capsule 0   . zonisamide (ZONEGRAN) 25 MG capsule Take by mouth.    . budesonide-formoterol (SYMBICORT) 80-4.5 MCG/ACT inhaler Inhale 2 puffs into the lungs 2 (two) times daily. Rinse mouth after each use. 3 each 2  . fluticasone (FLONASE) 50 MCG/ACT nasal spray Place 1 spray into both nostrils 2 (two) times daily as needed for allergies or rhinitis. 48 g 2  . montelukast (SINGULAIR) 10 MG tablet Take 1 tablet (10 mg total) by mouth at bedtime. 90 tablet 2   No current facility-administered medications for this visit.   Allergies: Allergies  Allergen Reactions  . Other Hives, Itching and Shortness Of Breath  . Pollen Extract Other (See Comments)    Sneezing/itchy eyes/runny nose   I reviewed her past medical history, social history, family history, and environmental history and no significant changes have been reported from her previous visit.  Review of Systems  Constitutional: Negative for appetite change, chills, fever and unexpected weight change.  HENT: Positive for congestion, rhinorrhea and sneezing.   Eyes: Positive for itching.  Respiratory: Negative for cough, chest tightness, shortness of breath and wheezing.   Cardiovascular: Negative for chest pain.  Gastrointestinal: Positive for constipation. Negative for abdominal pain.  Genitourinary: Negative for difficulty urinating.  Skin: Negative for rash.  Allergic/Immunologic: Positive for environmental allergies. Negative for food allergies.  Neurological: Negative for headaches.   Objective: BP 100/78   Pulse 80   Temp 97.7 F (36.5 C)   Resp 16   Ht 5\' 5"  (1.651 m)   Wt 126 lb (57.2 kg)   SpO2 98%   BMI 20.97 kg/m  Body mass index is 20.97 kg/m. Physical Exam Vitals and nursing note reviewed.  Constitutional:      Appearance: Normal appearance. She is well-developed.  HENT:     Head: Normocephalic and atraumatic.     Right Ear: External ear normal.     Left Ear: External ear normal.     Nose: Nose normal.      Mouth/Throat:     Mouth: Mucous membranes are moist.     Pharynx: Oropharynx is clear.  Eyes:     Conjunctiva/sclera: Conjunctivae normal.  Cardiovascular:     Rate and Rhythm: Normal rate and regular rhythm.     Heart sounds: Normal heart sounds. No murmur heard. No friction rub. No gallop.   Pulmonary:     Effort: Pulmonary effort is normal.     Breath sounds: Normal breath sounds. No wheezing or rales.  Musculoskeletal:     Cervical back: Neck supple.  Skin:    General: Skin is warm.     Findings: No rash.  Neurological:     Mental Status: She is alert and oriented to person, place, and time.  Psychiatric:        Behavior: Behavior normal.    Previous notes and tests were reviewed. The plan was reviewed with the patient/family, and all questions/concerned were addressed.  It was my pleasure to see Zacari today and participate in her care. Please feel free to contact me with any questions or concerns.  Sincerely,  , DO Allergy & Immunology  [facility] office: [phone] [facility] office: [phone]

## 2020-08-19 ENCOUNTER — Encounter: Payer: Self-pay | Admitting: Allergy

## 2020-08-19 ENCOUNTER — Other Ambulatory Visit: Payer: Self-pay

## 2020-08-19 ENCOUNTER — Ambulatory Visit: Payer: Commercial Managed Care - PPO | Admitting: Allergy

## 2020-08-19 VITALS — BP 100/78 | HR 80 | Temp 97.7°F | Resp 16 | Ht 65.0 in | Wt 126.0 lb

## 2020-08-19 DIAGNOSIS — J4540 Moderate persistent asthma, uncomplicated: Secondary | ICD-10-CM

## 2020-08-19 DIAGNOSIS — J3089 Other allergic rhinitis: Secondary | ICD-10-CM

## 2020-08-19 DIAGNOSIS — H1013 Acute atopic conjunctivitis, bilateral: Secondary | ICD-10-CM | POA: Diagnosis not present

## 2020-08-19 DIAGNOSIS — K219 Gastro-esophageal reflux disease without esophagitis: Secondary | ICD-10-CM

## 2020-08-19 DIAGNOSIS — J302 Other seasonal allergic rhinitis: Secondary | ICD-10-CM | POA: Diagnosis not present

## 2020-08-19 MED ORDER — FLUTICASONE PROPIONATE 50 MCG/ACT NA SUSP
1.0000 | Freq: Two times a day (BID) | NASAL | 2 refills | Status: AC | PRN
Start: 2020-08-19 — End: ?

## 2020-08-19 MED ORDER — MONTELUKAST SODIUM 10 MG PO TABS
10.0000 mg | ORAL_TABLET | Freq: Every day | ORAL | 2 refills | Status: AC
Start: 2020-08-19 — End: ?

## 2020-08-19 MED ORDER — BUDESONIDE-FORMOTEROL FUMARATE 80-4.5 MCG/ACT IN AERO: 2.0000 | INHALATION_SPRAY | Freq: Two times a day (BID) | RESPIRATORY_TRACT | 2 refills | Status: DC

## 2020-08-19 NOTE — Assessment & Plan Note (Signed)
Heartburn symptoms much better now and only using famotidine as needed.  May use famotidine 20mg  daily as needed.  See below lifestyle and dietary modifications.

## 2020-08-19 NOTE — Patient Instructions (Addendum)
Asthma: . Daily controller medication(s): continue Symbicort 2 puffs twice a day and rinse mouth after each use.  . Continue Singulair (montelukast) 10mg  daily at night. . May use albuterol rescue inhaler 2 puffs every 4 to 6 hours as needed for shortness of breath, chest tightness, coughing, and wheezing. May use albuterol rescue inhaler 2 puffs 5 to 15 minutes prior to strenuous physical activities. Monitor frequency of use.  . Asthma control goals:  o Full participation in all desired activities (may need albuterol before activity) o Albuterol use two times or less a week on average (not counting use with activity) o Cough interfering with sleep two times or less a month o Oral steroids no more than once a year o No hospitalizations  Allergic rhino conjunctivitis: 2020 skin testing showed: Positive to grass, weed, ragweed, tree, dust mites, cat, dog, mold.  Continue environmental control measures.  Consider restarting allergy injections once you get settled.   May use over the counter antihistamines such as Zyrtec (cetirizine), Claritin (loratadine), Allegra (fexofenadine), or Xyzal (levocetirizine) daily as needed. May take twice a day if needed.   May use Flonase (fluticasone) nasal spray 1 spray per nostril twice a day as needed for nasal congestion.   May use Opcon A eye drops as needed for itchy/watery eyes.  Reflux  May use famotidine 20mg  daily as needed.  See below lifestyle and dietary modifications.   Follow up with an allergist once you move.  If your allergies flare before your weeding let know!  Reducing Pollen Exposure . Pollen seasons: trees (spring), grass (summer) and ragweed/weeds (fall). [birth year]1982 Keep windows closed in your home and car to lower pollen exposure.  10[birth year]18[birth year]1980 air conditioning in the bedroom and throughout the house if possible.  . Avoid going out in dry windy days - especially early morning. . Pollen counts are highest between 5 - 10 AM and  on dry, hot and windy days.  . Save outside activities for late afternoon or after a heavy rain, when pollen levels are lower.  . Avoid mowing of grass if you have grass pollen allergy. Marland Kitchen Be aware that pollen can also be transported indoors on people and pets.  . Dry your clothes in an automatic dryer rather than hanging them outside where they might collect pollen.  . Rinse hair and eyes before bedtime. Control of House Dust Mite Allergen . Dust mite allergens are a common trigger of allergy and asthma symptoms. While they can be found throughout the house, these microscopic creatures thrive in warm, humid environments such as bedding, upholstered furniture and carpeting. . Because so much time is spent in the bedroom, it is essential to reduce mite levels there.  . Encase pillows, mattresses, and box springs in special allergen-proof fabric covers or airtight, zippered plastic covers.  . Bedding should be washed weekly in hot water (130 F) and dried in a hot dryer. Allergen-proof covers are available for comforters and pillows that can't be regularly washed.  Lilian Kapur the allergy-proof covers every few months. Minimize clutter in the bedroom. Keep pets out of the bedroom.  Marland Kitchen Keep humidity less than 50% by using a dehumidifier or air conditioning. You can buy a humidity measuring device called a hygrometer to monitor this.  . If possible, replace carpets with hardwood, linoleum, or washable area rugs. If that's not possible, vacuum frequently with a vacuum that has a HEPA filter. . Remove all upholstered furniture and non-washable window drapes from the bedroom. Reyes Ivan  Remove all non-washable stuffed toys from the bedroom.  Wash stuffed toys weekly. Pet Allergen Avoidance: . Contrary to popular opinion, there are no "hypoallergenic" breeds of dogs or cats. That is because people are not allergic to an animal's hair, but to an allergen found in the animal's saliva, dander (dead skin flakes) or urine. Pet  allergy symptoms typically occur within minutes. For some people, symptoms can build up and become most severe 8 to 12 hours after contact with the animal. People with severe allergies can experience reactions in public places if dander has been transported on the pet owners' clothing. Marland Kitchen Keeping an animal outdoors is only a partial solution, since homes with pets in the yard still have higher concentrations of animal allergens. . Before getting a pet, ask your allergist to determine if you are allergic to animals. If your pet is already considered part of your family, try to minimize contact and keep the pet out of the bedroom and other rooms where you spend a great deal of time. . As with dust mites, vacuum carpets often or replace carpet with a hardwood floor, tile or linoleum. . High-efficiency particulate air (HEPA) cleaners can reduce allergen levels over time. . While dander and saliva are the source of cat and dog allergens, urine is the source of allergens from rabbits, hamsters, mice and [place] pigs; so ask a non-allergic family member to clean the animal's cage. . If you have a pet allergy, talk to your allergist about the potential for allergy immunotherapy (allergy shots). This strategy can often provide long-term relief. Mold Control . Mold and fungi can grow on a variety of surfaces provided certain temperature and moisture conditions exist.  . Outdoor molds grow on plants, decaying vegetation and soil. The major outdoor mold, Alternaria and Cladosporium, are found in very high numbers during hot and dry conditions. Generally, a late summer - fall peak is seen for common outdoor fungal spores. Rain will temporarily lower outdoor mold spore count, but counts rise rapidly when the rainy period ends. . The most important indoor molds are Aspergillus and Penicillium. Dark, humid and poorly ventilated basements are ideal sites for mold growth. The next most common sites of mold growth are the  bathroom and the kitchen. Outdoor (Seasonal) Mold Control . Use air conditioning and keep windows closed. . Avoid exposure to decaying vegetation. Marland Kitchen Avoid leaf raking. . Avoid grain handling. . Consider wearing a face mask if working in moldy areas.  Indoor (Perennial) Mold Control  . Maintain humidity below 50%. . Get rid of mold growth on hard surfaces with water, detergent and, if necessary, 5% bleach (do not mix with other cleaners). Then dry the area completely. If mold covers an area more than 10 square feet, consider hiring an indoor environmental professional. . For clothing, washing with soap and water is best. If moldy items cannot be cleaned and dried, throw them away. . Remove sources e.g. contaminated carpets. . Repair and seal leaking roofs or pipes. Using dehumidifiers in damp basements may be helpful, but empty the water and clean units regularly to prevent mildew from forming. All rooms, especially basements, bathrooms and kitchens, require ventilation and cleaning to deter mold and mildew growth. Avoid carpeting on concrete or damp floors, and storing items in damp areas.  Heartburn Heartburn is a type of pain or discomfort that can happen in your throat or chest. It is often described as a burning pain. It may also cause a bad, acid-like taste in  your mouth. It may be caused by stomach contents that move back up (reflux) into the part of the body that moves food from your mouth to your stomach (esophagus). Heartburn may feel worse:  When you lie down.  When you bend over.  At night. Follow these instructions at home: Eating and drinking  Avoid certain foods and drinks as told by your doctor. This may include: ? Coffee and tea, with or without caffeine. ? Drinks that have alcohol. ? Energy drinks and sports drinks. ? Carbonated drinks or sodas. ? Chocolate and cocoa. ? Peppermint and mint flavorings. ? Garlic and onions. ? Horseradish. ? Spicy and acidic foods,  such as:  Peppers.  Chili powder and curry powder.  Vinegar.  Hot sauces and BBQ sauce. ? Citrus fruit juices and citrus fruits, such as:  Oranges.  Lemons.  Limes. ? Tomato-based foods, such as:  Red sauce and pizza with red sauce.  Chili.  Salsa. ? Fried and fatty foods, such as:  Donuts.  [place] fries and potato chips.  High-fat dressings. ? High-fat meats, such as:  Hot dogs and sausage.  Rib eye steak.  Ham and bacon. ? High-fat dairy items, such as:  Whole milk.  Butter.  Cream cheese.  Eat small meals often. Avoid eating large meals.  Avoid drinking large amounts of liquid with your meals.  Avoid eating meals during the 2-3 hours before bedtime.  Avoid lying down right after you eat.  Do not exercise right after you eat.   Lifestyle  If you are overweight, lose an amount of weight that is healthy for you. Ask your doctor about a safe weight loss goal.  Do not smoke or use any products that contain nicotine or tobacco. These can make your symptoms worse. If you need help quitting, ask your doctor.  Wear loose clothes. Do not wear anything tight around your waist.  Raise (elevate) the head of your bed about 6 inches (15 cm) when you sleep. You can use a wedge to do this.  Try to lower your stress. If you need help doing this, ask your doctor.      Medicines  Take over-the-counter and prescription medicines only as told by your doctor.  Do not take aspirin or NSAIDs, such as ibuprofen, unless your doctor says it is okay.  Stop medicines only as told by your doctor. If you stop taking some medicines too quickly, your symptoms may get worse. General instructions  Watch for any changes in your symptoms.  Keep all follow-up visits. Contact a doctor if:  You have new symptoms.  You lose weight and you do not know why.  You have trouble swallowing, or it hurts to swallow.  You have wheezing or a cough that keeps happening.  Your  symptoms do not get better with treatment.  You have heartburn often for more than 2 weeks. Get help right away if:  You have pain in your arms, neck, jaw, teeth, or back all of a sudden.  You feel sweaty, dizzy, or light-headed all of a sudden.  You have chest pain or shortness of breath.  You vomit and your vomit looks like blood or coffee grounds.  Your poop (stool) is bloody or black. These symptoms may be an emergency. Get help right away. Call your local emergency services (911 in the [place]  Do not wait to see if the symptoms will go away.  Do not drive yourself to the hospital. Summary  Heartburn is a  type of pain that can happen in your throat or chest. It can feel like a burning pain. It may also cause a bad, acid-like taste in your mouth.  You may need to avoid certain foods and drinks to help your symptoms. Ask your doctor what foods and drinks you should avoid.  Take over-the-counter and prescription medicines only as told by your doctor. Do not take aspirin or NSAIDs, such as ibuprofen, unless your doctor told you to do so.  Contact your doctor if your symptoms do not get better or they get worse. This information is not intended to replace advice given to you by your health care provider. Make sure you discuss any questions you have with your health care provider. Document Revised: 12/02/2019 Document Reviewed: 12/02/2019 Elsevier Patient Education  2021 [employer]

## 2020-08-19 NOTE — Assessment & Plan Note (Signed)
Past history - Diagnosed with asthma over 10 years ago.Triggers include allergies and exertion. Interim history - doing much better with Symbicort.   Today's spirometry was normal. ACT score 20. . Daily controller medication(s): continue Symbicort 2 puffs twice a day and rinse mouth after each use.  . Continue Singulair (montelukast) 10mg  daily at night. . May use albuterol rescue inhaler 2 puffs every 4 to 6 hours as needed for shortness of breath, chest tightness, coughing, and wheezing. May use albuterol rescue inhaler 2 puffs 5 to 15 minutes prior to strenuous physical activities. Monitor frequency of use.

## 2020-08-19 NOTE — Assessment & Plan Note (Addendum)
Past history - Perennial rhinoconjunctivitis symptoms for the past 10 years with worsening in the spring.  2020 skin testing showed: Positive to grass, weed, ragweed, tree, dust mites, cat, dog, mold. Started AIT on 05/26/2019 (M-DM-C-D & G-RW-W-T) Interim history - Stopped AIT in December 2021 due to scheduling. Patient is moving next month and getting married.   Continue environmental control measures.  Consider restarting allergy injections once she gets settled in the new area.   May use over the counter antihistamines such as Zyrtec (cetirizine), Claritin (loratadine), Allegra (fexofenadine), or Xyzal (levocetirizine) daily as needed. May take twice a day if needed.   May use Flonase (fluticasone) nasal spray 1 spray per nostril twice a day as needed for nasal congestion.   May use Opcon A eye drops as needed for itchy/watery eyes.

## 2020-08-31 NOTE — Progress Notes (Deleted)
25 y.o. G0P0000 Single White or Caucasian Hispanic or Latino female here for annual exam.      No LMP recorded. Patient has had an implant.          Sexually active: {yes no:314532}  The current method of family planning is {contraception:315051}.    Exercising: {yes no:314532}  {types:19826} Smoker:  {YES [id number]  Health Maintenance: Pap:  02/20/2019 WNL History of abnormal Pap:  no MMG:  None  BMD:   None  Colonoscopy:  TDaP:  02/09/20 Gardasil: ***   reports that she has never smoked. She has never used smokeless tobacco. She reports that she does not drink alcohol and does not use drugs.  Past Medical History:  Diagnosis Date  . Acne    tx with spironolactone  . Allergy    Has allergies to dogs and cats.  . Anxiety    no meds  . Asthma    as a child, rarely uses inhaler  . Cat allergies 04/20/2013  . Depression    no meds  . Depression    Phreesia 05/20/2020  . GERD (gastroesophageal reflux disease)    occasional  . Migraine headache with aura   . Recurrent UTI     Past Surgical History:  Procedure Laterality Date  . LESION REMOVAL N/A 05/28/2017   Procedure: PLASTICS REPAIR OF INTROIDUS;  Surgeon: Romualdo Bolk, MD;  Location: [facility];  Service: Gynecology;  Laterality: N/A;  . WISDOM TOOTH EXTRACTION      Current Outpatient Medications  Medication Sig Dispense Refill  . albuterol (VENTOLIN HFA) 108 (90 Base) MCG/ACT inhaler INHALE 2 PUFFS INTO THE LUNGS EVERY 4 HOURS AS NEEDED FOR WHEEZE OR FOR SHORTNESS OF BREATH 36 each 0  . budesonide-formoterol (SYMBICORT) 80-4.5 MCG/ACT inhaler Inhale 2 puffs into the lungs 2 (two) times daily. Rinse mouth after each use. 3 each 2  . butalbital-acetaminophen-caffeine (FIORICET) 50-325-40 MG tablet Take 1-2 tablets by mouth every 6 (six) hours as needed for headache. 20 tablet 0  . cetirizine (ZYRTEC) 10 MG tablet Take 1 tablet (10 mg total) by mouth daily as needed (for allergies.). 30 tablet 2  . cyclobenzaprine  (FLEXERIL) 5 MG tablet Take 1 tablet (5 mg total) by mouth 3 (three) times daily as needed for muscle spasms. 30 tablet 1  . EPINEPHrine 0.3 mg/0.3 mL IJ SOAJ injection Inject 0.3 mLs (0.3 mg total) into the muscle once as needed for anaphylaxis. 2 each 1  . Etonogestrel (NEXPLANON [place]) Inject into the skin.     . famotidine (PEPCID) 20 MG tablet Take 1 tablet (20 mg total) by mouth 2 (two) times daily as needed for heartburn or indigestion. 60 tablet 1  . fluticasone (FLONASE) 50 MCG/ACT nasal spray Place 1 spray into both nostrils 2 (two) times daily as needed for allergies or rhinitis. 48 g 2  . hydrocortisone (ANUSOL-HC) 2.5 % rectal cream Place 1 application rectally 2 (two) times daily. 30 g 0  . metaxalone (SKELAXIN) 800 MG tablet Take 1 tablet (800 mg total) by mouth 3 (three) times daily. 90 tablet 0  . methocarbamol (ROBAXIN) 500 MG tablet Take 1 tablet (500 mg total) by mouth 4 (four) times daily. 60 tablet 0  . montelukast (SINGULAIR) 10 MG tablet Take 1 tablet (10 mg total) by mouth at bedtime. 90 tablet 2  . Naphazoline-Pheniramine (OPCON-A) 0.027-0.315 % SOLN Place 1 drop into both eyes 3 (three) times daily as needed (for irritated/itchy eyes.).    Marland Kitchen naratriptan (AMERGE)  2.5 MG tablet Take 1 mg by mouth as needed.    . venlafaxine XR (EFFEXOR XR) 37.5 MG 24 hr capsule Take 1 capsule (37.5 mg total) by mouth daily with breakfast. 90 capsule 0  . zonisamide (ZONEGRAN) 25 MG capsule Take by mouth.     No current facility-administered medications for this visit.    Family History  Problem Relation Age of Onset  . Hypertension Mother   . Hyperlipidemia Mother   . Scoliosis Mother   . Lupus Maternal Grandmother   . Osteoporosis Maternal Grandmother   . Heart disease Maternal Grandfather   . Hypertension Maternal Grandfather   . Diabetes Maternal Grandfather   . Emphysema Maternal Grandfather   . Heart attack Maternal Grandfather        occurred < 52 years old  . Hyperlipidemia  Maternal Grandfather   . COPD Maternal Grandfather   . Cancer Maternal Grandfather        skin cancer  . Osteoarthritis Paternal Grandmother   . Diabetes Paternal Grandfather   . Heart disease Paternal Grandfather   . Kidney failure Paternal Grandfather        2 transplants due to DM  . Hypertension Paternal Grandfather   . Hyperlipidemia Paternal Grandfather   . Stroke Paternal Grandfather   . Parkinson's disease Paternal Grandfather     Review of Systems  Exam:   There were no vitals taken for this visit.  Weight change: @WEIGHTCHANGE @ Height:      Ht Readings from Last 3 Encounters:  08/19/20 5\' 5"  (1.651 m)  07/12/20 5\' 5"  (1.651 m)  04/13/20 5\' 5"  (1.651 m)    General appearance: alert, cooperative and appears stated age Head: Normocephalic, without obvious abnormality, atraumatic Neck: no adenopathy, supple, symmetrical, trachea midline and thyroid {CHL AMB PHY EX THYROID NORM DEFAULT:[phone]::"normal to inspection and palpation"} Lungs: clear to auscultation bilaterally Cardiovascular: regular rate and rhythm Breasts: {Exam; breast:13139::"normal appearance, no masses or tenderness"} Abdomen: soft, non-tender; non distended,  no masses,  no organomegaly Extremities: extremities normal, atraumatic, no cyanosis or edema Skin: Skin color, texture, turgor normal. No rashes or lesions Lymph nodes: Cervical, supraclavicular, and axillary nodes normal. No abnormal inguinal nodes palpated Neurologic: Grossly normal   Pelvic: External genitalia:  no lesions              Urethra:  normal appearing urethra with no masses, tenderness or lesions              Bartholins and Skenes: normal                 Vagina: normal appearing vagina with normal color and discharge, no lesions              Cervix: {CHL AMB PHY EX CERVIX NORM DEFAULT:[phone]::"no lesions"}               Bimanual Exam:  Uterus:  {CHL AMB PHY EX UTERUS NORM DEFAULT:[phone]::"normal size, contour,  position, consistency, mobility, non-tender"}              Adnexa: {CHL AMB PHY EX ADNEXA NO MASS DEFAULT:[phone]::"no mass, fullness, tenderness"}               Rectovaginal: Confirms               Anus:  normal sphincter tone, no lesions  *** chaperoned for the exam.  A:  Well Woman with normal exam  P:

## 2020-09-01 ENCOUNTER — Ambulatory Visit: Payer: Self-pay | Admitting: Obstetrics and Gynecology

## 2020-09-12 NOTE — Progress Notes (Signed)
25 y.o. G0P0000 Single White or Caucasian Hispanic or Latino female here for annual exam.  Is getting Married next Monday in [place] (eloping). Has a nexplanon, placed in 12/20. Occasional deep dyspareunia, positional.   Period Cycle (Days): 28 Period Duration (Days): 5 Period Pattern: Regular Menstrual Flow: Moderate Menstrual Control: Thin pad Menstrual Control Change Freq (Hours): 3-4 Dysmenorrhea: None  Patient's last menstrual period was 09/03/2020.          Sexually active: Yes.    The current method of family planning is Implant.    Exercising: Yes.    Weights,  Smoker:  no  Health Maintenance: Pap:  02/20/2019 WNL History of abnormal Pap:  no MMG:  None  BMD:   None  Colonoscopy: none  TDaP:  02/09/20  Gardasil: no   reports that she has never smoked. She has never used smokeless tobacco. She reports that she does not drink alcohol and does not use drugs. Occasional ETOH. 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   Past Medical History:  Diagnosis Date  . Acne    tx with spironolactone  . Allergy    Has allergies to dogs and cats.  . Anxiety    no meds  . Asthma    as a child, rarely uses inhaler  . Cat allergies 04/20/2013  . Depression    no meds  . Depression    Phreesia 05/20/2020  . GERD (gastroesophageal reflux disease)    occasional  . Migraine headache with aura   . Recurrent UTI     Past Surgical History:  Procedure Laterality Date  . LESION REMOVAL N/A 05/28/2017   Procedure: PLASTICS REPAIR OF INTROIDUS;  Surgeon: Romualdo Bolk, MD;  Location: [facility];  Service: Gynecology;  Laterality: N/A;  . WISDOM TOOTH EXTRACTION      Current Outpatient Medications  Medication Sig Dispense Refill  . albuterol (VENTOLIN HFA) 108 (90 Base) MCG/ACT inhaler INHALE 2 PUFFS INTO THE LUNGS EVERY 4 HOURS AS NEEDED FOR WHEEZE OR FOR SHORTNESS OF BREATH 36 each 0  . budesonide-formoterol (SYMBICORT) 80-4.5  MCG/ACT inhaler Inhale 2 puffs into the lungs 2 (two) times daily. Rinse mouth after each use. 3 each 2  . cetirizine (ZYRTEC) 10 MG tablet Take 1 tablet (10 mg total) by mouth daily as needed (for allergies.). 30 tablet 2  . Etonogestrel (NEXPLANON [place]) Inject into the skin.     . famotidine (PEPCID) 20 MG tablet Take 1 tablet (20 mg total) by mouth 2 (two) times daily as needed for heartburn or indigestion. 60 tablet 1  . fluticasone (FLONASE) 50 MCG/ACT nasal spray Place 1 spray into both nostrils 2 (two) times daily as needed for allergies or rhinitis. 48 g 2  . hydrocortisone (ANUSOL-HC) 2.5 % rectal cream Place 1 application rectally 2 (two) times daily. 30 g 0  . metaxalone (SKELAXIN) 800 MG tablet Take 1 tablet (800 mg total) by mouth 3 (three) times daily. 90 tablet 0  . montelukast (SINGULAIR) 10 MG tablet Take 1 tablet (10 mg total) by mouth at bedtime. 90 tablet 2  . Naphazoline-Pheniramine (OPCON-A) 0.027-0.315 % SOLN Place 1 drop into both eyes 3 (three) times daily as needed (for irritated/itchy eyes.).    Marland Kitchen naratriptan (AMERGE) 2.5 MG tablet Take 1 mg by mouth as needed.     No current facility-administered medications for this visit.    Family History  Problem Relation Age of Onset  . Hypertension Mother   .  Hyperlipidemia Mother   . Scoliosis Mother   . Lupus Maternal Grandmother   . Osteoporosis Maternal Grandmother   . Heart disease Maternal Grandfather   . Hypertension Maternal Grandfather   . Diabetes Maternal Grandfather   . Emphysema Maternal Grandfather   . Heart attack Maternal Grandfather        occurred < 35 years old  . Hyperlipidemia Maternal Grandfather   . COPD Maternal Grandfather   . Cancer Maternal Grandfather        skin cancer  . Osteoarthritis Paternal Grandmother   . Diabetes Paternal Grandfather   . Heart disease Paternal Grandfather   . Kidney failure Paternal Grandfather        2 transplants due to DM  . Hypertension Paternal Grandfather    . Hyperlipidemia Paternal Grandfather   . Stroke Paternal Grandfather   . Parkinson's disease Paternal Grandfather     Review of Systems  All other systems reviewed and are negative. Dealing with constipation for the last 6 months. Seeing her primary today to discuss it further. She is taking stool softeners and laxatives, with that has a BM every 1-2 days.   Exam:   BP 110/60   Pulse 98   Ht 5\' 5"  (1.651 m)   Wt 126 lb (57.2 kg)   LMP 09/03/2020   SpO2 100%   BMI 20.97 kg/m   Weight change: @WEIGHTCHANGE @ Height:   Height: 5\' 5"  (165.1 cm)  Ht Readings from Last 3 Encounters:  09/13/20 5\' 5"  (1.651 m)  08/19/20 5\' 5"  (1.651 m)  07/12/20 5\' 5"  (1.651 m)    General appearance: alert, cooperative and appears stated age Head: Normocephalic, without obvious abnormality, atraumatic Neck: no adenopathy, supple, symmetrical, trachea midline and thyroid normal to inspection and palpation Lungs: clear to auscultation bilaterally Cardiovascular: regular rate and rhythm Breasts: normal appearance, no masses or tenderness Abdomen: soft, non-tender; non distended,  no masses,  no organomegaly Extremities: extremities normal, atraumatic, no cyanosis or edema Skin: Skin color, texture, turgor normal. No rashes or lesions Lymph nodes: Cervical, supraclavicular, and axillary nodes normal. No abnormal inguinal nodes palpated Neurologic: Grossly normal   Pelvic: External genitalia:  no lesions              Urethra:  normal appearing urethra with no masses, tenderness or lesions              Bartholins and Skenes: normal                 Vagina: normal appearing vagina with normal color and discharge, no lesions              Cervix: no lesions               Bimanual Exam:  Uterus:  normal size, contour, position, consistency, mobility, non-tender              Adnexa: no mass, fullness, tenderness               Rectovaginal: Confirms               Anus:  normal sphincter tone, no  lesions  11/13/20 chaperoned for the exam.  1. Well woman exam Discussed breast self exam Discussed calcium and vit D intake No pap this year Information on the gardasil vaccination given and discussed  2. Encounter for surveillance of implantable subdermal contraceptive Doing well  3. Vitamin D deficiency On a multivitamin - VITAMIN D 25 Hydroxy (Vit-D Deficiency,  Fractures)  4. Screening examination for STD (sexually transmitted disease) - HIV Antibody (routine testing w rflx) - RPR - SURESWAB CT/NG/T. vaginalis

## 2020-09-13 ENCOUNTER — Telehealth (INDEPENDENT_AMBULATORY_CARE_PROVIDER_SITE_OTHER): Payer: Commercial Managed Care - PPO | Admitting: Registered Nurse

## 2020-09-13 ENCOUNTER — Ambulatory Visit: Payer: Commercial Managed Care - PPO | Admitting: Obstetrics and Gynecology

## 2020-09-13 ENCOUNTER — Encounter: Payer: Self-pay | Admitting: Obstetrics and Gynecology

## 2020-09-13 ENCOUNTER — Other Ambulatory Visit: Payer: Self-pay

## 2020-09-13 VITALS — BP 110/60 | HR 98 | Ht 65.0 in | Wt 126.0 lb

## 2020-09-13 DIAGNOSIS — E559 Vitamin D deficiency, unspecified: Secondary | ICD-10-CM | POA: Diagnosis not present

## 2020-09-13 DIAGNOSIS — K5904 Chronic idiopathic constipation: Secondary | ICD-10-CM

## 2020-09-13 DIAGNOSIS — K623 Rectal prolapse: Secondary | ICD-10-CM | POA: Diagnosis not present

## 2020-09-13 DIAGNOSIS — Z113 Encounter for screening for infections with a predominantly sexual mode of transmission: Secondary | ICD-10-CM

## 2020-09-13 DIAGNOSIS — Z01419 Encounter for gynecological examination (general) (routine) without abnormal findings: Secondary | ICD-10-CM | POA: Diagnosis not present

## 2020-09-13 DIAGNOSIS — Z3046 Encounter for surveillance of implantable subdermal contraceptive: Secondary | ICD-10-CM

## 2020-09-13 NOTE — Patient Instructions (Signed)

## 2020-09-13 NOTE — Progress Notes (Signed)
Telemedicine Encounter- SOAP NOTE Established Patient  This telephone encounter was conducted with the patient's (or proxy's) verbal consent via audio telecommunications: yes  Patient was instructed to have this encounter in a suitably private space; and to only have persons present to whom they give permission to participate. In addition, patient identity was confirmed by use of name plus two identifiers (DOB and address).  I discussed the limitations, risks, security and privacy concerns of performing an evaluation and management service by telephone and the availability of in person appointments. I also discussed with the patient that there may be a patient responsible charge related to this service. The patient expressed understanding and agreed to proceed.  I spent a total of 19 minutes talking with the patient or their proxy.  Pt at home Provider in office  Chief Complaint  Patient presents with  . Constipation    Pt reports has been struggling with constipation for several months now, having lower left abdominal pain, reports taking laxative and can go once daily or every other, reports still hard to go when going     Subjective   Judy [name] is a 25 y.o. established patient. Telephone visit today for constipation  HPI Ongoing. Hx of hemorrhoids. Also now notes hx of rectal prolapse as infant - was not aware of this until her mother had told her No new bulging or drainage from rectum. No blood or mucus in stool Using senokot most days, good effect mostly, but still has to strain to move bowels frequently.  No weight loss, vomiting, nausea, changes to diet or exercise, or other new concerns. Has not seen GI in the past. No hx of colonoscopy.  Patient Active Problem List   Diagnosis Date Noted  . Moderate persistent asthma without complication 05/26/2020  . Gastroesophageal reflux disease 05/26/2020  . Unspecified asthma(493.90) 04/20/2013  . Cat allergies 04/20/2013  .  Seasonal and perennial allergic rhinoconjunctivitis 04/20/2013    Past Medical History:  Diagnosis Date  . Acne    tx with spironolactone  . Allergy    Has allergies to dogs and cats.  . Anxiety    no meds  . Asthma    as a child, rarely uses inhaler  . Cat allergies 04/20/2013  . Depression    no meds  . Depression    Phreesia 05/20/2020  . GERD (gastroesophageal reflux disease)    occasional  . Migraine headache with aura   . Recurrent UTI     Current Outpatient Medications  Medication Sig Dispense Refill  . albuterol (VENTOLIN HFA) 108 (90 Base) MCG/ACT inhaler INHALE 2 PUFFS INTO THE LUNGS EVERY 4 HOURS AS NEEDED FOR WHEEZE OR FOR SHORTNESS OF BREATH 36 each 0  . budesonide-formoterol (SYMBICORT) 80-4.5 MCG/ACT inhaler Inhale 2 puffs into the lungs 2 (two) times daily. Rinse mouth after each use. 3 each 2  . cetirizine (ZYRTEC) 10 MG tablet Take 1 tablet (10 mg total) by mouth daily as needed (for allergies.). 30 tablet 2  . Etonogestrel (NEXPLANON [place]) Inject into the skin.     . famotidine (PEPCID) 20 MG tablet Take 1 tablet (20 mg total) by mouth 2 (two) times daily as needed for heartburn or indigestion. 60 tablet 1  . fluticasone (FLONASE) 50 MCG/ACT nasal spray Place 1 spray into both nostrils 2 (two) times daily as needed for allergies or rhinitis. 48 g 2  . hydrocortisone (ANUSOL-HC) 2.5 % rectal cream Place 1 application rectally 2 (two) times daily. 30  g 0  . metaxalone (SKELAXIN) 800 MG tablet Take 1 tablet (800 mg total) by mouth 3 (three) times daily. 90 tablet 0  . montelukast (SINGULAIR) 10 MG tablet Take 1 tablet (10 mg total) by mouth at bedtime. 90 tablet 2  . Naphazoline-Pheniramine (OPCON-A) 0.027-0.315 % SOLN Place 1 drop into both eyes 3 (three) times daily as needed (for irritated/itchy eyes.).    Marland Kitchen naratriptan (AMERGE) 2.5 MG tablet Take 1 mg by mouth as needed.     No current facility-administered medications for this visit.    Allergies  Allergen  Reactions  . Other Hives, Itching and Shortness Of Breath  . Pollen Extract Other (See Comments)    Sneezing/itchy eyes/runny nose    Social History   Socioeconomic History  . Marital status: Single    Spouse name: Not on file  . Number of children: Not on file  . Years of education: Not on file  . Highest education level: Not on file  Occupational History  . Not on file  Tobacco Use  . Smoking status: Never Smoker  . Smokeless tobacco: Never Used  Vaping Use  . Vaping Use: Never used  Substance and Sexual Activity  . Alcohol use: No  . Drug use: No  . Sexual activity: Not Currently    Birth control/protection: Abstinence    Comment: female partner  Other Topics Concern  . Not on file  Social History Narrative  . Not on file   Social Determinants of Health   Financial Resource Strain: Not on file  Food Insecurity: Not on file  Transportation Needs: Not on file  Physical Activity: Not on file  Stress: Not on file  Social Connections: Not on file  Intimate Partner Violence: Not on file    Review of Systems  Constitutional: Negative.   HENT: Negative.   Eyes: Negative.   Respiratory: Negative.   Cardiovascular: Negative.   Gastrointestinal: Positive for constipation.  Genitourinary: Negative.   Musculoskeletal: Negative.   Skin: Negative.   Neurological: Negative.   Endo/Heme/Allergies: Negative.   Psychiatric/Behavioral: Negative.   All other systems reviewed and are negative.   Objective   Vitals as reported by the patient: There were no vitals filed for this visit.  Judy [name] was seen today for constipation.  Diagnoses and all orders for this visit:  Chronic idiopathic constipation -     Ambulatory referral to Gastroenterology  Rectal prolapse -     Ambulatory referral to Gastroenterology   PLAN  Refer to GI. Concern for recurrence of rectal prolapse. May be IBS-C as well  Discussed diet and exercise changes to help BM  Discussed that while  there are no apparent red flags at this time, I would want her to see a specialist to ensure no further concerns.  Patient encouraged to call clinic with any questions, comments, or concerns.   I discussed the assessment and treatment plan with the patient. The patient was provided an opportunity to ask questions and all were answered. The patient agreed with the plan and demonstrated an understanding of the instructions.   The patient was advised to call back or seek an in-person evaluation if the symptoms worsen or if the condition fails to improve as anticipated.  I provided 19 minutes of non-face-to-face time during this encounter.  [name], NP  Primary Care at [facility]: [facility]

## 2020-09-14 LAB — VITAMIN D 25 HYDROXY (VIT D DEFICIENCY, FRACTURES): Vit D, 25-Hydroxy: 32 ng/mL (ref 30–100)

## 2020-09-14 LAB — SURESWAB CT/NG/T. VAGINALIS
C. trachomatis RNA, TMA: NOT DETECTED
N. gonorrhoeae RNA, TMA: NOT DETECTED
Trichomonas vaginalis RNA: NOT DETECTED

## 2020-09-14 LAB — RPR: RPR Ser Ql: NONREACTIVE

## 2020-09-14 LAB — HIV ANTIBODY (ROUTINE TESTING W REFLEX): HIV 1&2 Ab, 4th Generation: NONREACTIVE

## 2020-12-22 ENCOUNTER — Encounter: Payer: Self-pay | Admitting: Gastroenterology

## 2020-12-26 ENCOUNTER — Ambulatory Visit (INDEPENDENT_AMBULATORY_CARE_PROVIDER_SITE_OTHER): Payer: Commercial Managed Care - PPO | Admitting: Obstetrics and Gynecology

## 2020-12-26 ENCOUNTER — Encounter: Payer: Self-pay | Admitting: Obstetrics and Gynecology

## 2020-12-26 ENCOUNTER — Other Ambulatory Visit: Payer: Self-pay

## 2020-12-26 VITALS — BP 122/64 | HR 77 | Resp 16 | Ht 65.0 in | Wt 130.0 lb

## 2020-12-26 DIAGNOSIS — N941 Unspecified dyspareunia: Secondary | ICD-10-CM | POA: Diagnosis not present

## 2020-12-26 DIAGNOSIS — R102 Pelvic and perineal pain: Secondary | ICD-10-CM | POA: Diagnosis not present

## 2020-12-26 DIAGNOSIS — M6289 Other specified disorders of muscle: Secondary | ICD-10-CM | POA: Diagnosis not present

## 2020-12-26 DIAGNOSIS — N763 Subacute and chronic vulvitis: Secondary | ICD-10-CM

## 2020-12-26 LAB — WET PREP FOR TRICH, YEAST, CLUE

## 2020-12-26 NOTE — Patient Instructions (Addendum)
Try uberlube for lubrication. If you continue to have pain on entry, please call  You should control rate and depth of penetration with intercourse.   We will set up an ultrasound

## 2020-12-26 NOTE — Progress Notes (Signed)
GYNECOLOGY  VISIT   HPI: 25 y.o.   Married White or Caucasian Hispanic or Latino  female   G0P0000 with Patient's last menstrual period was 12/18/2020.   here for  pain with intercourse. She c/o a 6 week h/o worsening dyspareunia. Hurts on entry and deep inside. One time she had pelvic cramping after sex, that pain was up to a 7-8/10 in severity, lasted for 15 minutes.  The deep dyspareunia is not positional. She had a yeast infection right before all of the pain started. She was treated with diflucan after on line visit.  She does notice a few day h/o mild itching on her vulva. No increase in discharge, no baseline irritation, no odor.   She has had issues with constipation. Comes and goes. She takes miralax and stool softeners often. She has a BM every 2-3 days.   GYNECOLOGIC HISTORY: Patient's last menstrual period was 12/18/2020. Contraception: Nexplanon  Menopausal hormone therapy: none         OB History     Gravida  0   Para  0   Term  0   Preterm  0   AB  0   Living  0      SAB  0   IAB  0   Ectopic  0   Multiple  0   Live Births  0              Patient Active Problem List   Diagnosis Date Noted   Moderate persistent asthma without complication 05/26/2020   Gastroesophageal reflux disease 05/26/2020   Unspecified asthma(493.90) 04/20/2013   Cat allergies 04/20/2013   Seasonal and perennial allergic rhinoconjunctivitis 04/20/2013    Past Medical History:  Diagnosis Date   Acne    tx with spironolactone   Allergy    Has allergies to dogs and cats.   Anxiety    no meds   Asthma    as a child, rarely uses inhaler   Cat allergies 04/20/2013   Depression    no meds   Depression    Phreesia 05/20/2020   GERD (gastroesophageal reflux disease)    occasional   Migraine headache with aura    Recurrent UTI     Past Surgical History:  Procedure Laterality Date   LESION REMOVAL N/A 05/28/2017   Procedure: PLASTICS REPAIR OF INTROIDUS;   Surgeon: Romualdo Bolk, MD;  Location: [facility];  Service: Gynecology;  Laterality: N/A;   WISDOM TOOTH EXTRACTION      Current Outpatient Medications  Medication Sig Dispense Refill   albuterol (VENTOLIN HFA) 108 (90 Base) MCG/ACT inhaler INHALE 2 PUFFS INTO THE LUNGS EVERY 4 HOURS AS NEEDED FOR WHEEZE OR FOR SHORTNESS OF BREATH 36 each 0   budesonide-formoterol (SYMBICORT) 80-4.5 MCG/ACT inhaler Inhale 2 puffs into the lungs 2 (two) times daily. Rinse mouth after each use. 3 each 2   cetirizine (ZYRTEC) 10 MG tablet Take 1 tablet (10 mg total) by mouth daily as needed (for allergies.). 30 tablet 2   Etonogestrel (NEXPLANON [place]) Inject into the skin.      famotidine (PEPCID) 20 MG tablet Take 1 tablet (20 mg total) by mouth 2 (two) times daily as needed for heartburn or indigestion. 60 tablet 1   fluticasone (FLONASE) 50 MCG/ACT nasal spray Place 1 spray into both nostrils 2 (two) times daily as needed for allergies or rhinitis. 48 g 2   metaxalone (SKELAXIN) 800 MG tablet Take 1 tablet (800 mg total) by  mouth 3 (three) times daily. 90 tablet 0   montelukast (SINGULAIR) 10 MG tablet Take 1 tablet (10 mg total) by mouth at bedtime. 90 tablet 2   Naphazoline-Pheniramine (OPCON-A) 0.027-0.315 % SOLN Place 1 drop into both eyes 3 (three) times daily as needed (for irritated/itchy eyes.).     naratriptan (AMERGE) 2.5 MG tablet Take 1 mg by mouth as needed.     No current facility-administered medications for this visit.     ALLERGIES: Other and Pollen extract  Family History  Problem Relation Age of Onset   Hypertension Mother    Hyperlipidemia Mother    Scoliosis Mother    Lupus Maternal Grandmother    Osteoporosis Maternal Grandmother    Heart disease Maternal Grandfather    Hypertension Maternal Grandfather    Diabetes Maternal Grandfather    Emphysema Maternal Grandfather    Heart attack Maternal Grandfather        occurred < 60 years old   Hyperlipidemia Maternal Grandfather     COPD Maternal Grandfather    Cancer Maternal Grandfather        skin cancer   Osteoarthritis Paternal Grandmother    Diabetes Paternal Grandfather    Heart disease Paternal Grandfather    Kidney failure Paternal Grandfather        2 transplants due to DM   Hypertension Paternal Grandfather    Hyperlipidemia Paternal Grandfather    Stroke Paternal Grandfather    Parkinson's disease Paternal Grandfather     Social History   Socioeconomic History   Marital status: Married    Spouse name: Not on file   Number of children: Not on file   Years of education: Not on file   Highest education level: Not on file  Occupational History   Not on file  Tobacco Use   Smoking status: Never   Smokeless tobacco: Never  Vaping Use   Vaping Use: Never used  Substance and Sexual Activity   Alcohol use: No   Drug use: No   Sexual activity: Not Currently    Birth control/protection: Abstinence    Comment: female partner  Other Topics Concern   Not on file  Social History Narrative   Not on file   Social Determinants of Health   Financial Resource Strain: Not on file  Food Insecurity: Not on file  Transportation Needs: Not on file  Physical Activity: Not on file  Stress: Not on file  Social Connections: Not on file  Intimate Partner Violence: Not on file    Review of Systems  All other systems reviewed and are negative.  PHYSICAL EXAMINATION:    BP 122/64   Pulse 77   Resp 16   Ht 5\' 5"  (1.651 m)   Wt 130 lb (59 kg)   LMP 12/18/2020   BMI 21.63 kg/m     General appearance: alert, cooperative and appears stated age  Pelvic: External genitalia:  no lesions, mild erythema, minimal discomfort with palpation of the vestibule with a cotton swab.               Urethra:  normal appearing urethra with no masses, tenderness or lesions              Bartholins and Skenes: normal                 Vagina: normal appearing vagina with normal color and discharge, no lesions               Cervix:  no cervical motion tenderness and no lesions              Bimanual Exam:  Uterus:   anteverted, mobile, +/-tender, normal sized              Adnexa:  no masses, mildly tender bilaterally              Bladder: not tender  Pelvic floor: mildly tender L>R  Chaperone was present for exam.  1. Dyspareunia, female Mildly tender on exam - SureSwab, Vaginosis/Vaginitis Plus -Use lubricant (discussed options), if continues to have entry pain will try lidocaine ointment -She should control rate and depth of penetration -Will order pelvic ultrasound near where she lives in [place], [place] -Discussed possible pelvic floor PT  2. Subacute vulvitis - WET PREP FOR TRICH, YEAST, CLUE - SureSwab, Vaginosis/Vaginitis Plus  3. Tenderness of female pelvic organs Mild - SureSwab, Vaginosis/Vaginitis Plus  4. Pelvic floor dysfunction Mildly tender Consider pelvic floor PT  Over 30 minutes in total patient care

## 2020-12-28 ENCOUNTER — Telehealth: Payer: Self-pay

## 2020-12-28 NOTE — Telephone Encounter (Signed)
I called patient and left her a message that I faxed order to [facility] and received fax confirmation they received it.  They should be calling her to schedule. I told her to call us if she does note hear from them.

## 2020-12-28 NOTE — Telephone Encounter (Signed)
Patient called asking If we could send an order for pelvic u/s to [facility] [phone]).  I called and spoke with Asher Muir in the Radiology Dept. Who provided me fax #[phone] and she said once order is received they will enter it in system and someone from their scheduling department will call patient to schedule appointment.  Paper order on Dr Salli Quarry desk to sign.

## 2020-12-30 LAB — SURESWAB, VAGINOSIS/VAGINITIS PLUS
Atopobium vaginae: NOT DETECTED Log (cells/mL)
C. albicans, DNA: NOT DETECTED
C. glabrata, DNA: NOT DETECTED
C. parapsilosis, DNA: NOT DETECTED
C. trachomatis RNA, TMA: NOT DETECTED
C. tropicalis, DNA: NOT DETECTED
Gardnerella vaginalis: NOT DETECTED Log (cells/mL)
LACTOBACILLUS SPECIES: NOT DETECTED Log (cells/mL)
MEGASPHAERA SPECIES: NOT DETECTED Log (cells/mL)
N. gonorrhoeae RNA, TMA: NOT DETECTED
Trichomonas vaginalis RNA: NOT DETECTED

## 2021-01-20 ENCOUNTER — Encounter: Payer: Self-pay | Admitting: Gastroenterology

## 2021-01-20 ENCOUNTER — Ambulatory Visit: Payer: Commercial Managed Care - PPO | Admitting: Gastroenterology

## 2021-01-20 VITALS — BP 106/68 | HR 84 | Ht 65.0 in | Wt 131.2 lb

## 2021-01-20 DIAGNOSIS — K5909 Other constipation: Secondary | ICD-10-CM | POA: Diagnosis not present

## 2021-01-20 DIAGNOSIS — R1032 Left lower quadrant pain: Secondary | ICD-10-CM

## 2021-01-20 MED ORDER — LINACLOTIDE 145 MCG PO CAPS: 145.0000 ug | ORAL_CAPSULE | Freq: Every day | ORAL | 0 refills | Status: DC

## 2021-01-20 MED ORDER — POLYETHYLENE GLYCOL 3350 17 G PO PACK: 17.0000 g | PACK | Freq: Two times a day (BID) | ORAL | 0 refills | Status: DC

## 2021-01-20 NOTE — Patient Instructions (Signed)
If you are age 23 or older, your body mass index should be between 23-30. Your Body mass index is 21.84 kg/m. If this is out of the aforementioned range listed, please consider follow up with your Primary Care Provider.  If you are age 62 or younger, your body mass index should be between 19-25. Your Body mass index is 21.84 kg/m. If this is out of the aformentioned range listed, please consider follow up with your Primary Care Provider.   __________________________________________________________  The [facility] providers would like to encourage you to use [facility] to communicate with providers for non-urgent requests or questions.  Due to long hold times on the telephone, sending your provider a message by [facility] may be a faster and more efficient way to get a response.  Please allow 48 business hours for a response.  Please remember that this is for non-urgent requests.    Please purchase the following medications over the counter and take as directed: Miralax: Take as directed twice a day. Increase or decrease as needed  If this is not effective, you can try Linzess 145 mcg: Take once daily in the morning before breakfast  Thank you for entrusting me with your care and for choosing [facility], Dr. Ileene Patrick

## 2021-01-20 NOTE — Progress Notes (Signed)
HPI :  25 year old female with a history of migraine headache, acne, asthma, referred by [name], NP for constipation.  She states about 1 year ago she went on a migraine medicine, she thinks in the Zonisamide as well as an SSRI, which caused some constipation.  She states it was severe, was having a bowel movement once or twice per week.  She came off these medications but has since had ongoing issues with constipation.  She has used some over-the-counter stool softeners, senna, MiraLAX, fiber supplementation.  All of them have helped to some extent but she does not use them routinely.  She is currently having a bowel movement once or twice per week.  She gets some discomfort in her left lower quadrant when she is constipated, this really is relieved with a bowel movement reliably.  There is no blood in her stool.  She does have straining and a sense of incomplete evacuation when she has to use the bathroom.  She currently is not having any pains in her rectum but previously when constipated she had.  She has a history of rectal prolapse as a child which did not require surgery but has not had any recurrence of that that she is aware of.  She takes fiber currently about 2-3 times per week.  She has used some muscle relaxers at night for muscle pain but her symptoms predate its use.  She denies any major changes in her medications or diet otherwise since her symptoms began.  No family history of colon cancer.  No unanticipated weight loss.  Normal CBC and iron studies in 04/13/20 TSH 2.24   Past Medical History:  Diagnosis Date   Acne    tx with spironolactone   Allergy    Has allergies to dogs and cats.   Anxiety    no meds   Asthma    as a child, rarely uses inhaler   Cat allergies 04/20/2013   Depression    no meds   Depression    Phreesia 05/20/2020   GERD (gastroesophageal reflux disease)    occasional   Migraine headache with aura    Recurrent UTI      Past Surgical  History:  Procedure Laterality Date   LESION REMOVAL N/A 05/28/2017   Procedure: PLASTICS REPAIR OF INTROIDUS;  Surgeon: Romualdo Bolk, MD;  Location: [facility];  Service: Gynecology;  Laterality: N/A;   WISDOM TOOTH EXTRACTION     Family History  Problem Relation Age of Onset   Hypertension Mother    Hyperlipidemia Mother    Scoliosis Mother    Lupus Maternal Grandmother    Osteoporosis Maternal Grandmother    Heart disease Maternal Grandfather    Hypertension Maternal Grandfather    Diabetes Maternal Grandfather    Emphysema Maternal Grandfather    Heart attack Maternal Grandfather        occurred < 91 years old   Hyperlipidemia Maternal Grandfather    COPD Maternal Grandfather    Cancer Maternal Grandfather        skin cancer   Osteoarthritis Paternal Grandmother    Diabetes Paternal Grandfather    Heart disease Paternal Grandfather    Kidney failure Paternal Grandfather        2 transplants due to DM   Hypertension Paternal Grandfather    Hyperlipidemia Paternal Grandfather    Stroke Paternal Grandfather    Parkinson's disease Paternal Grandfather    Thyroid cancer Paternal Aunt    Social History  Tobacco Use   Smoking status: Never   Smokeless tobacco: Never  Vaping Use   Vaping Use: Never used  Substance Use Topics   Alcohol use: Yes    Comment: 1 beer par week   Drug use: Not Currently    Types: Marijuana   Current Outpatient Medications  Medication Sig Dispense Refill   albuterol (VENTOLIN HFA) 108 (90 Base) MCG/ACT inhaler INHALE 2 PUFFS INTO THE LUNGS EVERY 4 HOURS AS NEEDED FOR WHEEZE OR FOR SHORTNESS OF BREATH 36 each 0   budesonide-formoterol (SYMBICORT) 80-4.5 MCG/ACT inhaler Inhale 2 puffs into the lungs 2 (two) times daily. Rinse mouth after each use. 3 each 2   cetirizine (ZYRTEC) 10 MG tablet Take 1 tablet (10 mg total) by mouth daily as needed (for allergies.). 30 tablet 2   Cholecalciferol (VITAMIN D3 PO) Take 1 tablet by mouth daily.      Cyanocobalamin (VITAMIN B-12 PO) Take 1 tablet by mouth daily.     Etonogestrel (NEXPLANON [place]) Inject into the skin.      famotidine (PEPCID) 20 MG tablet Take 1 tablet (20 mg total) by mouth 2 (two) times daily as needed for heartburn or indigestion. (Patient taking differently: Take 20 mg by mouth as needed for heartburn or indigestion.) 60 tablet 1   fluticasone (FLONASE) 50 MCG/ACT nasal spray Place 1 spray into both nostrils 2 (two) times daily as needed for allergies or rhinitis. 48 g 2   metaxalone (SKELAXIN) 800 MG tablet Take 1 tablet (800 mg total) by mouth 3 (three) times daily. 90 tablet 0   Multiple Vitamin (MULTIVITAMIN) tablet Take 1 tablet by mouth daily.     Naphazoline-Pheniramine (OPCON-A) 0.027-0.315 % SOLN Place 1 drop into both eyes 3 (three) times daily as needed (for irritated/itchy eyes.).     naratriptan (AMERGE) 2.5 MG tablet Take 1 mg by mouth as needed.     montelukast (SINGULAIR) 10 MG tablet Take 1 tablet (10 mg total) by mouth at bedtime. (Patient not taking: Reported on 01/20/2021) 90 tablet 2   No current facility-administered medications for this visit.   Allergies  Allergen Reactions   Other Hives, Shortness Of Breath and Itching    Grass, trees, dogs,cats, mold   Pollen Extract Other (See Comments)    Sneezing/itchy eyes/runny nose     Review of Systems: All systems reviewed and negative except where noted in HPI.   Lab Results  Component Value Date   WBC 9.7 04/13/2020   HGB 15.1 04/13/2020   HCT 43.5 04/13/2020   MCV 94 04/13/2020   PLT 390 04/13/2020    Lab Results  Component Value Date   CREATININE 0.64 04/13/2020   BUN 7 04/13/2020   NA 141 04/13/2020   K 4.5 04/13/2020   CL 103 04/13/2020   CO2 25 04/13/2020    Lab Results  Component Value Date   ALT 10 02/20/2019   AST 18 02/20/2019   ALKPHOS 57 02/20/2019   BILITOT 0.4 02/20/2019     Physical Exam: BP 106/68 (BP Location: Right Arm, Patient Position: Sitting, Cuff Size:  Normal)   Pulse 84   Ht 5\' 5"  (1.651 m) Comment: height measured without shoes  Wt 131 lb 4 oz (59.5 kg)   BMI 21.84 kg/m  Constitutional: Pleasant,well-developed, female in no acute distress. HEENT: Normocephalic and atraumatic. Conjunctivae are normal. No scleral icterus. Neck supple.  Cardiovascular: Normal rate, regular rhythm.  Pulmonary/chest: Effort normal and breath sounds normal. No wheezing, rales or rhonchi. Abdominal:  Soft, nondistended, nontender. There are no masses palpable.  DRE - normal - no perianal lesions, no fissure, no mass lesions, normal squeeze, mostly normal relaxation - CMA Lucius Conn Standby Extremities: no edema Lymphadenopathy: No cervical adenopathy noted. Neurological: Alert and oriented to person place and time. Skin: Skin is warm and dry. No rashes noted. Psychiatric: Normal mood and affect. Behavior is normal.   ASSESSMENT AND PLAN: 25 year old female here for new patient assessment the following  Chronic constipation Left lower quadrant pain  Bowel changes over the past year or so which have persisted despite stopping medications which were associated with the onset of the symptoms.  She has no alarm symptoms, no anemia.  She clearly responds well to laxatives.  Suspect slow transit constipation, likely underlying IBS-C.  DRE did not suggest dyssynergia. Discussed options with her.  Recommend initially taking MiraLAX twice daily and titrating up or down as needed, this will be the cheapest option for her if it works.  I also provided some samples of Linzess 145 mcg daily if she wanted to try this as well and see what works best for her.  She will try higher dose MiraLAX daily for some time or Linzess for a week and see which she would prefer to take long-term.  If no improvement she should contact me in a few weeks for reassessment and can discuss other options.  I do not think that we need to pursue other evaluation such as colonoscopy unless her symptoms  persist or worsen despite appropriate therapy.  She agreed with the plan, answered questions from the patient and her husband to the best my ability.   Judy Rain, MD [facility]  CC: [name], NP

## 2021-01-25 ENCOUNTER — Telehealth: Payer: Self-pay

## 2021-01-25 ENCOUNTER — Other Ambulatory Visit: Payer: Self-pay

## 2021-01-25 DIAGNOSIS — M26623 Arthralgia of bilateral temporomandibular joint: Secondary | ICD-10-CM

## 2021-01-25 MED ORDER — METAXALONE 800 MG PO TABS: 800.0000 mg | ORAL_TABLET | Freq: Three times a day (TID) | ORAL | 0 refills | Status: DC

## 2021-01-25 NOTE — Telephone Encounter (Signed)
..   Encourage patient to contact the pharmacy for refills or they can request refills through [facility]  LAST APPOINTMENT DATE:  09/13/20  NEXT APPOINTMENT DATE: na  MEDICATION:  metaxalone 800 mg  Is the patient out of medication?   PHARMACY:  [facility] [address] [place]  [place] [phone]  Let patient know to contact pharmacy at the end of the day to make sure medication is ready.  Please notify patient to allow 48-72 hours to process  CLINICAL FILLS OUT ALL BELOW:   LAST REFILL:  QTY:  REFILL DATE:    OTHER COMMENTS:   Is requesting pharmacy to be updated to [facility] for refill?  Please advise

## 2021-01-25 NOTE — Telephone Encounter (Signed)
..   Encourage patient to contact the pharmacy for refills or they can request refills through [facility]   LAST APPOINTMENT DATE:  09/13/20   NEXT APPOINTMENT DATE: na   MEDICATION:  metaxalone 800 mg   Is the patient out of medication?    PHARMACY:  [facility] [address] [place]  [place] [phone]  LFD 07/26/20 #90 with no refills LOV 09/13/20 NOV none

## 2021-01-25 NOTE — Telephone Encounter (Signed)
Request sent to provider for approval.  

## 2021-02-24 ENCOUNTER — Other Ambulatory Visit: Payer: Self-pay

## 2021-02-24 ENCOUNTER — Ambulatory Visit (INDEPENDENT_AMBULATORY_CARE_PROVIDER_SITE_OTHER): Payer: Commercial Managed Care - PPO | Admitting: Obstetrics and Gynecology

## 2021-02-24 ENCOUNTER — Encounter: Payer: Self-pay | Admitting: Obstetrics and Gynecology

## 2021-02-24 VITALS — BP 120/62 | HR 73 | Ht 65.0 in | Wt 132.0 lb

## 2021-02-24 DIAGNOSIS — N941 Unspecified dyspareunia: Secondary | ICD-10-CM | POA: Diagnosis not present

## 2021-02-24 DIAGNOSIS — R102 Pelvic and perineal pain: Secondary | ICD-10-CM

## 2021-02-24 DIAGNOSIS — M6289 Other specified disorders of muscle: Secondary | ICD-10-CM

## 2021-02-24 DIAGNOSIS — R109 Unspecified abdominal pain: Secondary | ICD-10-CM

## 2021-02-24 MED ORDER — OXYCODONE HCL 5 MG PO TABS: 5.0000 mg | ORAL_TABLET | Freq: Four times a day (QID) | ORAL | 0 refills | Status: DC | PRN

## 2021-02-24 MED ORDER — NAPROXEN SODIUM 550 MG PO TABS
550.0000 mg | ORAL_TABLET | Freq: Two times a day (BID) | ORAL | 1 refills | Status: DC
Start: 2021-02-24 — End: 2021-07-04

## 2021-02-24 NOTE — Progress Notes (Signed)
GYNECOLOGY  VISIT   HPI: 25 y.o.   Married White or Caucasian Hispanic or Latino  female   [id number] with No LMP recorded. Patient has had an implant.   here for  lower left abdominal pain. She was told in July that she a cyst on her left ovary.  She had an ultrasound at [facility] [place]. She had to get the report on her phone. Patient states that she is at a 8/10 at the highest pain level.    The patient was seen in 7/22 c/o a 6 week h/o worsening dysmenorrhea. Pain on entry and deep inside, not positional.  At the time of that visit she had equivocal uterine tenderness and mild pelvic floor tenderness. Her vaginal and cervical cultures were negative for infection.   Since then the pain has gone from being every time to being intermittent. Sometimes positional.   She has a nexplanon for contraception.   Currently she c/o a 1 week h/o intermittent (one day was constant) LLQ abdominal pain, hurts more to move or with sex. Present most of the time. The pain is an aching feeling, sharp when going to the bathroom.  Hurts a lot to void or have a BM. She is having a BM every 1-2 days (improvement). No nausea, intermittent episodes of getting dizzy. Can be light headed just sitting.   She has seen a GI MD and has started taking daily miralax.   She had an ultrasound done 2 days ago, she had a 2.8 cm left ovarian cysts (she showed me the report on her phone).  GYNECOLOGIC HISTORY: No LMP recorded. Patient has had an implant. Contraception:Nexplanon  Menopausal hormone therapy: none         OB History     Gravida  0   Para  0   Term  0   Preterm  0   AB  0   Living  0      SAB  0   IAB  0   Ectopic  0   Multiple  0   Live Births  0              Patient Active Problem List   Diagnosis Date Noted   Moderate persistent asthma without complication 05/26/2020   Gastroesophageal reflux disease 05/26/2020   Unspecified asthma(493.90) 04/20/2013   Cat allergies  04/20/2013   Seasonal and perennial allergic rhinoconjunctivitis 04/20/2013    Past Medical History:  Diagnosis Date   Acne    tx with spironolactone   Allergy    Has allergies to dogs and cats.   Anxiety    no meds   Asthma    as a child, rarely uses inhaler   Cat allergies 04/20/2013   Depression    no meds   Depression    Phreesia 05/20/2020   GERD (gastroesophageal reflux disease)    occasional   Migraine headache with aura    Recurrent UTI     Past Surgical History:  Procedure Laterality Date   LESION REMOVAL N/A 05/28/2017   Procedure: PLASTICS REPAIR OF INTROIDUS;  Surgeon: Romualdo Bolk, MD;  Location: [facility];  Service: Gynecology;  Laterality: N/A;   WISDOM TOOTH EXTRACTION      Current Outpatient Medications  Medication Sig Dispense Refill   albuterol (VENTOLIN HFA) 108 (90 Base) MCG/ACT inhaler INHALE 2 PUFFS INTO THE LUNGS EVERY 4 HOURS AS NEEDED FOR WHEEZE OR FOR SHORTNESS OF BREATH 36 each 0   budesonide-formoterol (SYMBICORT) 80-4.5  MCG/ACT inhaler Inhale 2 puffs into the lungs 2 (two) times daily. Rinse mouth after each use. 3 each 2   cetirizine (ZYRTEC) 10 MG tablet Take 1 tablet (10 mg total) by mouth daily as needed (for allergies.). 30 tablet 2   Cholecalciferol (VITAMIN D3 PO) Take 1 tablet by mouth daily.     Cyanocobalamin (VITAMIN B-12 PO) Take 1 tablet by mouth daily.     Etonogestrel (NEXPLANON [place]) Inject into the skin.      famotidine (PEPCID) 20 MG tablet Take 1 tablet (20 mg total) by mouth 2 (two) times daily as needed for heartburn or indigestion. (Patient taking differently: Take 20 mg by mouth as needed for heartburn or indigestion.) 60 tablet 1   fluticasone (FLONASE) 50 MCG/ACT nasal spray Place 1 spray into both nostrils 2 (two) times daily as needed for allergies or rhinitis. 48 g 2   linaclotide (LINZESS) 145 MCG CAPS capsule Take 1 capsule (145 mcg total) by mouth daily before breakfast. Lot: [id number], exp 04-2021 12 capsule 0    metaxalone (SKELAXIN) 800 MG tablet Take 1 tablet (800 mg total) by mouth 3 (three) times daily. 90 tablet 0   montelukast (SINGULAIR) 10 MG tablet Take 1 tablet (10 mg total) by mouth at bedtime. 90 tablet 2   Multiple Vitamin (MULTIVITAMIN) tablet Take 1 tablet by mouth daily.     Naphazoline-Pheniramine (OPCON-A) 0.027-0.315 % SOLN Place 1 drop into both eyes 3 (three) times daily as needed (for irritated/itchy eyes.).     naratriptan (AMERGE) 2.5 MG tablet Take 1 mg by mouth as needed.     polyethylene glycol (MIRALAX) 17 g packet Take 17 g by mouth 2 (two) times daily. 14 each 0   No current facility-administered medications for this visit.     ALLERGIES: Other and Pollen extract  Family History  Problem Relation Age of Onset   Hypertension Mother    Hyperlipidemia Mother    Scoliosis Mother    Lupus Maternal Grandmother    Osteoporosis Maternal Grandmother    Heart disease Maternal Grandfather    Hypertension Maternal Grandfather    Diabetes Maternal Grandfather    Emphysema Maternal Grandfather    Heart attack Maternal Grandfather        occurred < 55 years old   Hyperlipidemia Maternal Grandfather    COPD Maternal Grandfather    Cancer Maternal Grandfather        skin cancer   Osteoarthritis Paternal Grandmother    Diabetes Paternal Grandfather    Heart disease Paternal Grandfather    Kidney failure Paternal Grandfather        2 transplants due to DM   Hypertension Paternal Grandfather    Hyperlipidemia Paternal Grandfather    Stroke Paternal Grandfather    Parkinson's disease Paternal Grandfather    Thyroid cancer Paternal Aunt     Social History   Socioeconomic History   Marital status: Married    Spouse name: Not on file   Number of children: 0   Years of education: Not on file   Highest education level: Not on file  Occupational History   Occupation: Doctor, general practice  Tobacco Use   Smoking status: Never   Smokeless tobacco: Never  Vaping Use    Vaping Use: Never used  Substance and Sexual Activity   Alcohol use: Yes    Comment: 1 beer par week   Drug use: Not Currently    Types: Marijuana   Sexual activity: Not Currently  Birth control/protection: Abstinence    Comment: female partner  Other Topics Concern   Not on file  Social History Narrative   Not on file   Social Determinants of Health   Financial Resource Strain: Not on file  Food Insecurity: Not on file  Transportation Needs: Not on file  Physical Activity: Not on file  Stress: Not on file  Social Connections: Not on file  Intimate Partner Violence: Not on file    Review of Systems  Gastrointestinal:  Positive for abdominal pain.   PHYSICAL EXAMINATION:    BP 120/62   Pulse 73   Ht 5\' 5"  (1.651 m)   Wt 132 lb (59.9 kg)   SpO2 99%   BMI 21.97 kg/m     General appearance: alert, cooperative and appears stated age Abdomen: soft, tender in the right lower quadrant and above her umbilicus, has referred pain to the LLQ, no rebound, no guarding, non distended, no masses,  no organomegaly  Pelvic: External genitalia:  no lesions              Urethra:  normal appearing urethra with no masses, tenderness or lesions              Bartholins and Skenes: normal                 Vagina: normal appearing vagina with normal color and discharge, no lesions              Cervix: no cervical motion tenderness and no lesions              Bimanual Exam:  Uterus:  normal size, contour, position, consistency, mobility, non-tender              Adnexa:  no masses, some tenderness on the left              Rectovaginal: Yes.  .  Confirms.              Anus:  normal sphincter tone, no lesions  Pelvic floor: tight bilaterally, tender on the left  Chaperone was present for the exam  1. Combined abdominal and pelvic pain Recent ultrasound with 2.8 cm simple left ovarian follicle The source of her pain isn't completely clear to me. We discussed that a 2.8 cm follicle is a normal  finding. She has had recent negative STD testing (in a monogamous relationship), she has some muscular and GI issues as well - naproxen sodium (ANAPROX DS) 550 MG tablet; Take 1 tablet (550 mg total) by mouth 2 (two) times daily with a meal. Take prn pain  Dispense: 30 tablet; Refill: 1 - oxyCODONE (ROXICODONE) 5 MG immediate release tablet; Take 1 tablet (5 mg total) by mouth every 6 (six) hours as needed for severe pain.  Dispense: 10 tablet; Refill: 0 -Tylenol -Discussed using a heating pad, soaking in a warm tub -If her pain isn't improving by next week or is worsening she should have another ultrasound  2. Pelvic floor dysfunction We have discussed pelvic floor PT. She will let me know if she wants a referral  3. Dyspareunia, female Recently improved.

## 2021-05-01 ENCOUNTER — Other Ambulatory Visit: Payer: Self-pay | Admitting: Registered Nurse

## 2021-05-01 DIAGNOSIS — M26623 Arthralgia of bilateral temporomandibular joint: Secondary | ICD-10-CM

## 2021-05-01 MED ORDER — METAXALONE 800 MG PO TABS: 800.0000 mg | ORAL_TABLET | Freq: Three times a day (TID) | ORAL | 0 refills | Status: DC

## 2021-05-09 ENCOUNTER — Telehealth: Payer: Self-pay | Admitting: Allergy

## 2021-05-09 DIAGNOSIS — J4530 Mild persistent asthma, uncomplicated: Secondary | ICD-10-CM

## 2021-05-09 MED ORDER — ALBUTEROL SULFATE HFA 108 (90 BASE) MCG/ACT IN AERS: INHALATION_SPRAY | RESPIRATORY_TRACT | 0 refills | Status: DC

## 2021-05-09 NOTE — Telephone Encounter (Signed)
Sent in refill informed pt just had to confirm location it is [place]

## 2021-05-09 NOTE — Telephone Encounter (Signed)
Patient called and needs albuterol hfa inhaler sent to [facility] in [place] // [phone]

## 2021-05-11 ENCOUNTER — Other Ambulatory Visit: Payer: Self-pay | Admitting: *Deleted

## 2021-07-04 ENCOUNTER — Telehealth (INDEPENDENT_AMBULATORY_CARE_PROVIDER_SITE_OTHER): Payer: Commercial Managed Care - PPO | Admitting: Registered Nurse

## 2021-07-04 DIAGNOSIS — K5904 Chronic idiopathic constipation: Secondary | ICD-10-CM | POA: Diagnosis not present

## 2021-07-04 MED ORDER — LINACLOTIDE 145 MCG PO CAPS: 145.0000 ug | ORAL_CAPSULE | Freq: Every day | ORAL | 0 refills | Status: DC

## 2021-07-04 MED ORDER — SENNA 8.6 MG PO TABS: 1.0000 | ORAL_TABLET | Freq: Every day | ORAL | 0 refills | Status: DC | PRN

## 2021-07-04 NOTE — Progress Notes (Signed)
Telemedicine Encounter- SOAP NOTE Established Patient  This video encounter was conducted with the patient's (or proxy's) verbal consent via audio telecommunications: yes/no: Yes Patient was instructed to have this encounter in a suitably private space; and to only have persons present to whom they give permission to participate. In addition, patient identity was confirmed by use of name plus two identifiers (DOB and address).  I discussed the limitations, risks, security and privacy concerns of performing an evaluation and management service by telephone and the availability of in person appointments. I also discussed with the patient that there may be a patient responsible charge related to this service. The patient expressed understanding and agreed to proceed.  I spent a total of 13 minutes talking with the patient or their proxy.  Patient at home Provider in office  Participants: Jari Sportsman, NP and Berta Minor  No chief complaint on file.   Subjective   Judy [name] is a 26 y.o. established patient. Video visit today for GI concerns  HPI Hx of acid reflux, chronic constipation / IBS-C. Has been taking MiraLax episodically with good effect Until last few weeks, more constipation lately.   Notes diet has some impact on BM. Alcohol and caffeine seem to worsen symptoms. Sometimes cheese, greasy foods will trigger symptoms as well.   Notes there is some abdominal cramping going on with recurrence of constipation.  Rare interspersed diarrhea.   Denies blood in stool. Sometimes mucus - about once every week.  Denies unexpected weight changes.   Patient Active Problem List   Diagnosis Date Noted   Moderate persistent asthma without complication 05/26/2020   Gastroesophageal reflux disease 05/26/2020   Unspecified asthma(493.90) 04/20/2013   Cat allergies 04/20/2013   Seasonal and perennial allergic rhinoconjunctivitis 04/20/2013    Past Medical History:  Diagnosis Date    Acne    tx with spironolactone   Allergy    Has allergies to dogs and cats.   Anxiety    no meds   Asthma    as a child, rarely uses inhaler   Cat allergies 04/20/2013   Depression    no meds   Depression    Phreesia 05/20/2020   GERD (gastroesophageal reflux disease)    occasional   Migraine headache with aura    Recurrent UTI     Current Outpatient Medications  Medication Sig Dispense Refill   senna (SENOKOT) 8.6 MG TABS tablet Take 1 tablet (8.6 mg total) by mouth daily as needed for mild constipation. 120 tablet 0   albuterol (VENTOLIN HFA) 108 (90 Base) MCG/ACT inhaler INHALE 2 PUFFS INTO THE LUNGS EVERY 4 HOURS AS NEEDED FOR WHEEZE OR FOR SHORTNESS OF BREATH 36 each 0   budesonide-formoterol (SYMBICORT) 80-4.5 MCG/ACT inhaler Inhale 2 puffs into the lungs 2 (two) times daily. Rinse mouth after each use. 3 each 2   cetirizine (ZYRTEC) 10 MG tablet Take 1 tablet (10 mg total) by mouth daily as needed (for allergies.). 30 tablet 2   Cholecalciferol (VITAMIN D3 PO) Take 1 tablet by mouth daily.     Cyanocobalamin (VITAMIN B-12 PO) Take 1 tablet by mouth daily.     Etonogestrel (NEXPLANON [place]) Inject into the skin.      famotidine (PEPCID) 20 MG tablet Take 1 tablet (20 mg total) by mouth 2 (two) times daily as needed for heartburn or indigestion. (Patient taking differently: Take 20 mg by mouth as needed for heartburn or indigestion.) 60 tablet 1   fluticasone (FLONASE) 50 MCG/ACT  nasal spray Place 1 spray into both nostrils 2 (two) times daily as needed for allergies or rhinitis. 48 g 2   linaclotide (LINZESS) 145 MCG CAPS capsule Take 1 capsule (145 mcg total) by mouth daily before breakfast. Lot: [id number], exp 04-2021 90 capsule 0   metaxalone (SKELAXIN) 800 MG tablet Take 1 tablet (800 mg total) by mouth 3 (three) times daily. 90 tablet 0   montelukast (SINGULAIR) 10 MG tablet Take 1 tablet (10 mg total) by mouth at bedtime. 90 tablet 2   Multiple Vitamin (MULTIVITAMIN) tablet  Take 1 tablet by mouth daily.     Naphazoline-Pheniramine (OPCON-A) 0.027-0.315 % SOLN Place 1 drop into both eyes 3 (three) times daily as needed (for irritated/itchy eyes.).     naratriptan (AMERGE) 2.5 MG tablet Take 1 mg by mouth as needed.     polyethylene glycol (MIRALAX) 17 g packet Take 17 g by mouth 2 (two) times daily. 14 each 0   No current facility-administered medications for this visit.    Allergies  Allergen Reactions   Other Hives, Shortness Of Breath and Itching    Grass, trees, dogs,cats, mold   Pollen Extract Other (See Comments)    Sneezing/itchy eyes/runny nose    Social History   Socioeconomic History   Marital status: Married    Spouse name: Not on file   Number of children: 0   Years of education: Not on file   Highest education level: Not on file  Occupational History   Occupation: retail aassociate  Tobacco Use   Smoking status: Never   Smokeless tobacco: Never  Vaping Use   Vaping Use: Never used  Substance and Sexual Activity   Alcohol use: Yes    Comment: 1 beer par week   Drug use: Not Currently    Types: Marijuana   Sexual activity: Not Currently    Birth control/protection: Abstinence    Comment: female partner  Other Topics Concern   Not on file  Social History Narrative   Not on file   Social Determinants of Health   Financial Resource Strain: Not on file  Food Insecurity: Not on file  Transportation Needs: Not on file  Physical Activity: Not on file  Stress: Not on file  Social Connections: Not on file  Intimate Partner Violence: Not on file    ROS Per hpi   Objective   Vitals as reported by the patient: There were no vitals filed for this visit.  Diagnoses and all orders for this visit:  Chronic idiopathic constipation -     linaclotide (LINZESS) 145 MCG CAPS capsule; Take 1 capsule (145 mcg total) by mouth daily before breakfast. Lot: [id number], exp 04-2021 -     senna (SENOKOT) 8.6 MG TABS tablet; Take 1 tablet (8.6  mg total) by mouth daily as needed for mild constipation. -     Ambulatory referral to Gastroenterology    PLAN Discussed options. Can continue to pursue aggressive diet and lifestyle management to improve symptoms, use of laxatives prn - at this point, miralax 1-2 times daily, would need additional agent.  Would recommend trial of a few weeks of linzess as recommended by GI in the past. Will send. Discussed risks, benefits, AE of this with patient Will place referral for her to see GI local to her new home in [place]. Patient encouraged to call clinic with any questions, comments, or concerns.  I discussed the assessment and treatment plan with the patient. The patient was provided an  opportunity to ask questions and all were answered. The patient agreed with the plan and demonstrated an understanding of the instructions.   The patient was advised to call back or seek an in-person evaluation if the symptoms worsen or if the condition fails to improve as anticipated.  I provided 22 minutes of face-to-face time during this encounter.  Maximiano Coss, NP

## 2021-07-04 NOTE — Progress Notes (Signed)
Established Patient Office Visit  Subjective:  Patient ID: Judy [name], female    DOB: 1996-05-05  Age: 26 y.o. MRN: [id number]  CC:  Chief Complaint  Patient presents with   Transitions Of Care    Patient states she is here for TOC. Per patient she would would also like to discuss medications and referral.    HPI Judy [name] presents for visit to est care  Tmj Ongoing. Interested in options. Wakes with pain daily. Tension through jaws, shoulders. Has been a while since she has had this treated. Headaches potentially related to this ongoing. OTC analgesics have very limited effect.   Anxiety/depression Denies hi/si Ongoing. Interested in discussing options. Interested in medication. Has not previously been treated.   Past Medical History:  Diagnosis Date   Acne    tx with spironolactone   Allergy    Has allergies to dogs and cats.   Anxiety    no meds   Asthma    as a child, rarely uses inhaler   Cat allergies 04/20/2013   Depression    no meds   Depression    Phreesia 05/20/2020   GERD (gastroesophageal reflux disease)    occasional   Migraine headache with aura    Recurrent UTI     Past Surgical History:  Procedure Laterality Date   LESION REMOVAL N/A 05/28/2017   Procedure: PLASTICS REPAIR OF INTROIDUS;  Surgeon: Salvadore Dom, MD;  Location: [facility];  Service: Gynecology;  Laterality: N/A;   WISDOM TOOTH EXTRACTION      Family History  Problem Relation Age of Onset   Hypertension Mother    Hyperlipidemia Mother    Scoliosis Mother    Lupus Maternal Grandmother    Osteoporosis Maternal Grandmother    Heart disease Maternal Grandfather    Hypertension Maternal Grandfather    Diabetes Maternal Grandfather    Emphysema Maternal Grandfather    Heart attack Maternal Grandfather        occurred < 35 years old   Hyperlipidemia Maternal Grandfather    COPD Maternal Grandfather    Cancer Maternal Grandfather        skin cancer   Osteoarthritis  Paternal Grandmother    Diabetes Paternal Grandfather    Heart disease Paternal Grandfather    Kidney failure Paternal Grandfather        2 transplants due to DM   Hypertension Paternal Grandfather    Hyperlipidemia Paternal Grandfather    Stroke Paternal Grandfather    Parkinson's disease Paternal Grandfather    Thyroid cancer Paternal Aunt     Social History   Socioeconomic History   Marital status: Married    Spouse name: Not on file   Number of children: 0   Years of education: Not on file   Highest education level: Not on file  Occupational History   Occupation: Special educational needs teacher  Tobacco Use   Smoking status: Never   Smokeless tobacco: Never  Vaping Use   Vaping Use: Never used  Substance and Sexual Activity   Alcohol use: Yes    Comment: 1 beer par week   Drug use: Not Currently    Types: Marijuana   Sexual activity: Not Currently    Birth control/protection: Abstinence    Comment: female partner  Other Topics Concern   Not on file  Social History Narrative   Not on file   Social Determinants of Health   Financial Resource Strain: Not on file  Food Insecurity: Not on file  Transportation Needs: Not on file  Physical Activity: Not on file  Stress: Not on file  Social Connections: Not on file  Intimate Partner Violence: Not on file    Outpatient Medications Prior to Visit  Medication Sig Dispense Refill   cetirizine (ZYRTEC) 10 MG tablet Take 1 tablet (10 mg total) by mouth daily as needed (for allergies.). 30 tablet 2   Etonogestrel (NEXPLANON [place]) Inject into the skin.      famotidine (PEPCID) 20 MG tablet Take 1 tablet (20 mg total) by mouth 2 (two) times daily as needed for heartburn or indigestion. (Patient taking differently: Take 20 mg by mouth as needed for heartburn or indigestion.) 60 tablet 1   Naphazoline-Pheniramine (OPCON-A) 0.027-0.315 % SOLN Place 1 drop into both eyes 3 (three) times daily as needed (for irritated/itchy eyes.).      naratriptan (AMERGE) 2.5 MG tablet Take 1 mg by mouth as needed.     albuterol (VENTOLIN HFA) 108 (90 Base) MCG/ACT inhaler INHALE 2 PUFFS INTO THE LUNGS EVERY 4 HOURS AS NEEDED FOR WHEEZE OR FOR SHORTNESS OF BREATH 36 each 0   budesonide-formoterol (SYMBICORT) 80-4.5 MCG/ACT inhaler Inhale 2 puffs into the lungs 2 (two) times daily. 1 each 5   EPINEPHrine 0.3 mg/0.3 mL IJ SOAJ injection Inject 0.3 mLs (0.3 mg total) into the muscle once as needed for anaphylaxis. 2 each 1   montelukast (SINGULAIR) 10 MG tablet Take 1 tablet (10 mg total) by mouth at bedtime. 30 tablet 5   QVAR REDIHALER 80 MCG/ACT inhaler Inhale 2 puffs into the lungs 2 (two) times daily. Rinse mouth after each use. 1 each 5   venlafaxine XR (EFFEXOR XR) 37.5 MG 24 hr capsule Take 1 capsule (37.5 mg total) by mouth daily with breakfast. 90 capsule 0   zonisamide (ZONEGRAN) 25 MG capsule Take by mouth.     No facility-administered medications prior to visit.    Allergies  Allergen Reactions   Other Hives, Shortness Of Breath and Itching    Grass, trees, dogs,cats, mold   Pollen Extract Other (See Comments)    Sneezing/itchy eyes/runny nose    ROS Review of Systems  Constitutional: Negative.   HENT: Negative.    Eyes: Negative.   Respiratory: Negative.    Cardiovascular: Negative.   Gastrointestinal: Negative.   Genitourinary: Negative.   Musculoskeletal: Negative.   Skin: Negative.   Neurological: Negative.   Psychiatric/Behavioral: Negative.    All other systems reviewed and are negative.    Objective:    Physical Exam Vitals and nursing note reviewed.  Constitutional:      General: She is not in acute distress.    Appearance: Normal appearance. She is normal weight. She is not ill-appearing, toxic-appearing or diaphoretic.  Cardiovascular:     Rate and Rhythm: Normal rate and regular rhythm.     Heart sounds: Normal heart sounds. No murmur heard.   No friction rub. No gallop.  Pulmonary:     Effort:  Pulmonary effort is normal. No respiratory distress.     Breath sounds: Normal breath sounds. No stridor. No wheezing, rhonchi or rales.  Chest:     Chest wall: No tenderness.  Skin:    General: Skin is warm and dry.  Neurological:     General: No focal deficit present.     Mental Status: She is alert and oriented to person, place, and time. Mental status is at baseline.  Psychiatric:        Mood and Affect: Mood  normal.        Behavior: Behavior normal.        Thought Content: Thought content normal.        Judgment: Judgment normal.    BP 120/77    Pulse 100    Temp 98.5 F (36.9 C) (Temporal)    Resp 18    Ht 5\' 5"  (1.651 m)    Wt 127 lb 14.4 oz (58 kg)    SpO2 100%    BMI 21.28 kg/m  Wt Readings from Last 3 Encounters:  02/24/21 132 lb (59.9 kg)  01/20/21 131 lb 4 oz (59.5 kg)  12/26/20 130 lb (59 kg)     Health Maintenance Due  Topic Date Due   HPV VACCINES (1 - 2-dose series) Never done   INFLUENZA VACCINE  01/09/2021       Topic Date Due   HPV VACCINES (1 - 2-dose series) Never done    Lab Results  Component Value Date   TSH 2.240 04/13/2020   Lab Results  Component Value Date   WBC 9.7 04/13/2020   HGB 15.1 04/13/2020   HCT 43.5 04/13/2020   MCV 94 04/13/2020   PLT 390 04/13/2020   Lab Results  Component Value Date   NA 141 04/13/2020   K 4.5 04/13/2020   CO2 25 04/13/2020   GLUCOSE 81 04/13/2020   BUN 7 04/13/2020   CREATININE 0.64 04/13/2020   BILITOT 0.4 02/20/2019   ALKPHOS 57 02/20/2019   AST 18 02/20/2019   ALT 10 02/20/2019   PROT 6.8 02/20/2019   ALBUMIN 4.9 02/20/2019   CALCIUM 10.1 04/13/2020   Lab Results  Component Value Date   CHOL 118 02/20/2019   Lab Results  Component Value Date   HDL 42 02/20/2019   Lab Results  Component Value Date   LDLCALC 57 02/20/2019   Lab Results  Component Value Date   TRIG 99 02/20/2019   Lab Results  Component Value Date   CHOLHDL 2.8 02/20/2019   No results found for: HGBA1C     Assessment & Plan:   Problem List Items Addressed This Visit   None Visit Diagnoses     Bilateral temporomandibular joint pain    -  Primary   Adjustment disorder with depressed mood       Migraine with aura and without status migrainosus, not intractable           Meds ordered this encounter  Medications   DISCONTD: methocarbamol (ROBAXIN) 500 MG tablet    Sig: Take 1 tablet (500 mg total) by mouth 4 (four) times daily.    Dispense:  60 tablet    Refill:  0    Order Specific Question:   Supervising Provider    Answer:   Carlota Raspberry, JEFFREY R [[id number]]   DISCONTD: venlafaxine XR (EFFEXOR XR) 37.5 MG 24 hr capsule    Sig: Take 1 capsule (37.5 mg total) by mouth daily with breakfast.    Dispense:  90 capsule    Refill:  0    Order Specific Question:   Supervising Provider    Answer:   Carlota Raspberry, JEFFREY R [[id number]]   DISCONTD: butalbital-acetaminophen-caffeine (FIORICET) 50-325-40 MG tablet    Sig: Take 1-2 tablets by mouth every 6 (six) hours as needed for headache.    Dispense:  20 tablet    Refill:  0    Order Specific Question:   Supervising Provider    Answer:   Carlota Raspberry, JEFFREY R [id number]  Follow-up: No follow-ups on file.   PLAN Robaxin 500mg  po qid prn for jaw pain. Discussed stretching routinely.  Fioricet as needed for headaches. Discussed risks, benefits, alternatives to this medication Start venlafaxine daily. Med check in 5-6 weeks Can consider referrals to PT, counseling.  Patient encouraged to call clinic with any questions, comments, or concerns.  Maximiano Coss, NP

## 2021-08-03 ENCOUNTER — Other Ambulatory Visit: Payer: Self-pay | Admitting: Registered Nurse

## 2021-08-03 DIAGNOSIS — M26623 Arthralgia of bilateral temporomandibular joint: Secondary | ICD-10-CM

## 2021-08-04 NOTE — Telephone Encounter (Signed)
Pt called back wanting [facility] in [facility] [address]

## 2021-08-05 ENCOUNTER — Other Ambulatory Visit: Payer: Self-pay | Admitting: Allergy

## 2021-08-05 DIAGNOSIS — J4530 Mild persistent asthma, uncomplicated: Secondary | ICD-10-CM

## 2021-08-08 MED ORDER — METAXALONE 800 MG PO TABS: 800.0000 mg | ORAL_TABLET | Freq: Three times a day (TID) | ORAL | 0 refills | Status: DC

## 2021-08-08 NOTE — Telephone Encounter (Signed)
Patient is requesting a refill of the following medications: Requested Prescriptions   Pending Prescriptions Disp Refills   metaxalone (SKELAXIN) 800 MG tablet 90 tablet 0    Sig: Take 1 tablet (800 mg total) by mouth 3 (three) times daily.    Date of patient request: 08/03/2021 Last office visit: 07/04/2021 video visit Date of last refill: 05/01/2021 Last refill amount: 90 Follow up time period per chart: none

## 2021-08-08 NOTE — Telephone Encounter (Signed)
Medication request has already been sent.

## 2021-10-17 ENCOUNTER — Telehealth: Payer: Self-pay

## 2021-10-17 NOTE — Telephone Encounter (Signed)
Patient called. She states she has history of painful ovarian cysts. ? ?She said for a few weeks she has had LLQ pelvic pain. She said at first it was infrequent but now it is fairly persistant. It hurts with movement "and when I use the restroom"-both BM and urination.  No UTI sx at all. Pain feels like it radiates up and down. She rates the pain a 5-6 on 1-10 pain scale.  She is not taking any pain medication and it has not disrupted her daily routine.  She uses Nexplanon for birth control.   ? ?She asked about having an ultrasound. ?

## 2021-10-17 NOTE — Telephone Encounter (Signed)
Patient called back to let us know that she now lives 4 hours away and she would like u/s ordered at the hospital there in [place], [place]. ?

## 2021-10-18 NOTE — Telephone Encounter (Signed)
Order faxed. ? ?Per DPR access note on file I left detailed message in voice mail letting her know I faxed order and received confirmation and they should be calling her to schedule or she can call them.  I read her Dr. Salli Quarry reply encouraging her to find gyn there where she lives. ?

## 2021-10-18 NOTE — Telephone Encounter (Signed)
Please go ahead and order a pelvic ultrasound for her. I think she probably should be seen. As much as I would like to see her, it makes sense for her to establish care with a GYN where she is living. I would encourage her to do that.  ?

## 2021-10-19 ENCOUNTER — Encounter: Payer: Self-pay | Admitting: Registered Nurse

## 2021-10-19 ENCOUNTER — Telehealth (INDEPENDENT_AMBULATORY_CARE_PROVIDER_SITE_OTHER): Payer: BLUE CROSS/BLUE SHIELD | Admitting: Registered Nurse

## 2021-10-19 ENCOUNTER — Other Ambulatory Visit: Payer: Self-pay

## 2021-10-19 DIAGNOSIS — K56609 Unspecified intestinal obstruction, unspecified as to partial versus complete obstruction: Secondary | ICD-10-CM | POA: Diagnosis not present

## 2021-10-19 DIAGNOSIS — K5909 Other constipation: Secondary | ICD-10-CM

## 2021-10-19 DIAGNOSIS — R1032 Left lower quadrant pain: Secondary | ICD-10-CM

## 2021-10-19 NOTE — Progress Notes (Signed)
? ? ?Telemedicine Encounter- SOAP NOTE Established Patient ? ?This video encounter was conducted with the patient's (or proxy's) verbal consent via audio telecommunications: yes/no: Yes ?Patient was instructed to have this encounter in a suitably private space; and to only have persons present to whom they give permission to participate. In addition, patient identity was confirmed by use of name plus two identifiers (DOB and address).  I discussed the limitations, risks, security and privacy concerns of performing an evaluation and management service by telephone and the availability of in person appointments. I also discussed with the patient that there may be a patient responsible charge related to this service. The patient expressed understanding and agreed to proceed. ? ?I spent a total of 18 minutes talking with the patient or their proxy. ? ?Patient at home ?Provider in office ? ?Participants: Jari Sportsmanich Castella Lerner, NP and Berta Minorhloe Ugarte ? ?Chief Complaint  ?Patient presents with  ? Follow-up  ?  Patient states she is still having GI problems and would like to discuss the referral she had being pushed out far.  ? ? ?Subjective  ? ?Judy [name] is a 26 y.o. established patient. Video visit today for follow up  ? ?HPI ?Pt with long hx of IBS-C ?[facility] ?This appt is in mid June.  ? ?She is concerned about her ongoing IBS symptoms masking colorectal cancer or other more insidious event ?She has had hx of ovarian cysts. ? ?She does have mucus in the stool nearly every BM.  ?Occ brbpr ?No weight changes ? ?Llq pain ? ? ?Patient Active Problem List  ? Diagnosis Date Noted  ? Moderate persistent asthma without complication 05/26/2020  ? Gastroesophageal reflux disease 05/26/2020  ? Unspecified asthma(493.90) 04/20/2013  ? Cat allergies 04/20/2013  ? Seasonal and perennial allergic rhinoconjunctivitis 04/20/2013  ? ? ?Past Medical History:  ?Diagnosis Date  ? Acne   ? tx  with spironolactone  ? Allergy   ? Has allergies to dogs and cats.  ? Anxiety   ? no meds  ? Asthma   ? as a child, rarely uses inhaler  ? Cat allergies 04/20/2013  ? Depression   ? no meds  ? Depression   ? Phreesia 05/20/2020  ? GERD (gastroesophageal reflux disease)   ? occasional  ? Migraine headache with aura   ? Recurrent UTI   ? ? ?Current Outpatient Medications  ?Medication Sig Dispense Refill  ? albuterol (VENTOLIN HFA) 108 (90 Base) MCG/ACT inhaler INHALE 2 PUFFS INTO THE LUNGS EVERY 4 HOURS AS NEEDED FOR WHEEZE OR FOR SHORTNESS OF BREATH 18 each 0  ? budesonide-formoterol (SYMBICORT) 80-4.5 MCG/ACT inhaler Inhale 2 puffs into the lungs 2 (two) times daily. Rinse mouth after each use. 3 each 2  ? cetirizine (ZYRTEC) 10 MG tablet Take 1 tablet (10 mg total) by mouth daily as needed (for allergies.). 30 tablet 2  ? Cholecalciferol (VITAMIN D3 PO) Take 1 tablet by mouth daily.    ? Cyanocobalamin (VITAMIN B-12 PO) Take 1 tablet by mouth daily.    ? Etonogestrel (NEXPLANON [place]) Inject into the skin.     ? famotidine (PEPCID) 20 MG tablet Take 1 tablet (20 mg total) by mouth 2 (two) times daily as needed for heartburn or indigestion. (Patient taking differently: Take 20 mg by mouth as needed for heartburn or indigestion.) 60 tablet 1  ? fluticasone (FLONASE) 50 MCG/ACT nasal spray Place 1 spray into both nostrils 2 (two) times daily  as needed for allergies or rhinitis. 48 g 2  ? linaclotide (LINZESS) 145 MCG CAPS capsule Take 1 capsule (145 mcg total) by mouth daily before breakfast. Lot: [id number], exp 04-2021 90 capsule 0  ? metaxalone (SKELAXIN) 800 MG tablet Take 1 tablet (800 mg total) by mouth 3 (three) times daily. 90 tablet 0  ? montelukast (SINGULAIR) 10 MG tablet Take 1 tablet (10 mg total) by mouth at bedtime. 90 tablet 2  ? Multiple Vitamin (MULTIVITAMIN) tablet Take 1 tablet by mouth daily.    ? Naphazoline-Pheniramine (OPCON-A) 0.027-0.315 % SOLN Place 1 drop into both eyes 3 (three) times daily as  needed (for irritated/itchy eyes.).    ? naratriptan (AMERGE) 2.5 MG tablet Take 1 mg by mouth as needed.    ? polyethylene glycol (MIRALAX) 17 g packet Take 17 g by mouth 2 (two) times daily. 14 each 0  ? senna (SENOKOT) 8.6 MG TABS tablet Take 1 tablet (8.6 mg total) by mouth daily as needed for mild constipation. 120 tablet 0  ? ?No current facility-administered medications for this visit.  ? ? ?Allergies  ?Allergen Reactions  ? Other Hives, Shortness Of Breath and Itching  ?  Grass, trees, dogs,cats, mold  ? Pollen Extract Other (See Comments)  ?  Sneezing/itchy eyes/runny nose  ? ? ?Social History  ? ?Socioeconomic History  ? Marital status: Married  ?  Spouse name: Not on file  ? Number of children: 0  ? Years of education: Not on file  ? Highest education level: Not on file  ?Occupational History  ? Occupation: Doctor, general practice  ?Tobacco Use  ? Smoking status: Never  ? Smokeless tobacco: Never  ?Vaping Use  ? Vaping Use: Never used  ?Substance and Sexual Activity  ? Alcohol use: Yes  ?  Comment: 1 beer par week  ? Drug use: Not Currently  ?  Types: Marijuana  ? Sexual activity: Not Currently  ?  Birth control/protection: Abstinence  ?  Comment: female partner  ?Other Topics Concern  ? Not on file  ?Social History Narrative  ? Not on file  ? ?Social Determinants of Health  ? ?Financial Resource Strain: Not on file  ?Food Insecurity: Not on file  ?Transportation Needs: Not on file  ?Physical Activity: Not on file  ?Stress: Not on file  ?Social Connections: Not on file  ?Intimate Partner Violence: Not on file  ? ? ?ROS ?Per hpi  ? ?Objective  ? ?Vitals as reported by the patient: ?There were no vitals filed for this visit. ? ?Judy [name] was seen today for follow-up. ? ?Diagnoses and all orders for this visit: ? ?Intestinal obstruction, unspecified cause, unspecified whether partial or complete (HCC) ?-     CT Abdomen Pelvis W Contrast; Future ? ?Left lower quadrant abdominal pain ?-     CT Abdomen Pelvis W Contrast;  Future ? ?Chronic constipation ?-     CT Abdomen Pelvis W Contrast; Future ? ? ? ?PLAN ?Order CT abd pel w contrast to start to rule out insidious causes of constipation and abd pain ?Follow up as indicated ?Encourage her to follow up as scheduled with GI ?Patient encouraged to call clinic with any questions, comments, or concerns. ? ? ?I discussed the assessment and treatment plan with the patient. The patient was provided an opportunity to ask questions and all were answered. The patient agreed with the plan and demonstrated an understanding of the instructions. ?  ?The patient was advised to call back or seek  an in-person evaluation if the symptoms worsen or if the condition fails to improve as anticipated. ? ?I provided 18 minutes of face-to-face time during this encounter. ? ?[name], NP ? ?

## 2021-10-19 NOTE — Patient Instructions (Signed)
° ° ° °  If you have lab work done today you will be contacted with your lab results within the next 2 weeks.  If you have not heard from us then please contact us. The fastest way to get your results is to register for My Chart. ° ° °IF you received an x-ray today, you will receive an invoice from [facility]. Please contact [facility] at [phone] with questions or concerns regarding your invoice.  ° °IF you received labwork today, you will receive an invoice from [facility]. Please contact [facility] at [phone] with questions or concerns regarding your invoice.  ° °Our billing staff will not be able to assist you with questions regarding bills from these companies. ° °You will be contacted with the lab results as soon as they are available. The fastest way to get your results is to activate your My Chart account. Instructions are located on the last page of this paperwork. If you have not heard from us regarding the results in 2 weeks, please contact this office. °  ° ° ° °

## 2021-10-24 NOTE — Telephone Encounter (Signed)
Pt is replying back about concerns for sxs of anemia asking about scans and if she should proceed  ?

## 2021-10-27 NOTE — Telephone Encounter (Signed)
Judy [name] is currently working on a PA for this patient. Patient is aware

## 2021-11-02 ENCOUNTER — Other Ambulatory Visit: Payer: Self-pay | Admitting: Allergy

## 2021-11-13 ENCOUNTER — Other Ambulatory Visit: Payer: Self-pay | Admitting: Registered Nurse

## 2021-11-13 DIAGNOSIS — M26623 Arthralgia of bilateral temporomandibular joint: Secondary | ICD-10-CM

## 2021-11-14 MED ORDER — METAXALONE 800 MG PO TABS: 800.0000 mg | ORAL_TABLET | Freq: Three times a day (TID) | ORAL | 0 refills | Status: DC

## 2021-12-18 ENCOUNTER — Telehealth (INDEPENDENT_AMBULATORY_CARE_PROVIDER_SITE_OTHER): Payer: BLUE CROSS/BLUE SHIELD | Admitting: Registered Nurse

## 2021-12-18 ENCOUNTER — Encounter: Payer: Self-pay | Admitting: Registered Nurse

## 2021-12-18 DIAGNOSIS — G8929 Other chronic pain: Secondary | ICD-10-CM | POA: Diagnosis not present

## 2021-12-18 DIAGNOSIS — J4530 Mild persistent asthma, uncomplicated: Secondary | ICD-10-CM | POA: Diagnosis not present

## 2021-12-18 DIAGNOSIS — M542 Cervicalgia: Secondary | ICD-10-CM | POA: Diagnosis not present

## 2021-12-18 MED ORDER — ALBUTEROL SULFATE HFA 108 (90 BASE) MCG/ACT IN AERS: 1.0000 | INHALATION_SPRAY | RESPIRATORY_TRACT | 11 refills | Status: AC | PRN

## 2021-12-18 MED ORDER — BUDESONIDE-FORMOTEROL FUMARATE 160-4.5 MCG/ACT IN AERO: 2.0000 | INHALATION_SPRAY | Freq: Two times a day (BID) | RESPIRATORY_TRACT | 11 refills | Status: DC

## 2021-12-18 NOTE — Progress Notes (Signed)
Telemedicine Encounter- SOAP NOTE Established Patient  This telephone encounter was conducted with the patient's (or proxy's) verbal consent via audio telecommunications: yes/no: Yes Patient was instructed to have this encounter in a suitably private space; and to only have persons present to whom they give permission to participate. In addition, patient identity was confirmed by use of name plus two identifiers (DOB and address).  I discussed the limitations, risks, security and privacy concerns of performing an evaluation and management service by telephone and the availability of in person appointments. I also discussed with the patient that there may be a patient responsible charge related to this service. The patient expressed understanding and agreed to proceed.  I spent a total of 14 minutes talking with the patient or their proxy.  Patient at home Provider in office  Participants: Jari Sportsman, NP and Berta Minor  Chief Complaint  Patient presents with   Medication Refill    Pt states she is needing her inhaler refilled for the Symbicort and albuterol inhaler She states she is using the inhaler more frequent now Would like a referral to Pulmonologist referral And a GI referral as well    near her and a GI referral as well     Subjective   Judy [name] is a 26 y.o. established patient. Telephone visit today for Refill, Referrals  HPI Refill Symbicort - has been using it more frequently lately.  She is concerned about the efficacy of this inhaler.  She had seen a pulmonologist in the past, but has not est near [place]  She had been seen by UC near her, put on singulair and steroids (medrol dose pack) Singulair did not help, she delayed starting steroids until recently  Does take daily antihistamine, has seen asthma and allergy in the past.  She would like to be referred to asthma/allergy local to her.  GI Has seen GI local to her. Concerned that steroid use has  worsened her abd cramping Particularly LLQ Still taking miralax. Interested in using suppository, She has some on hand.  Neck Pain Chronic, ongoing. Extends into upper back. No new injury or trauma Had seen corrective exercise specialist in the past, interested in est with this or PT near her. Does home stretching No headaches, visual changes, limits to ROM.    Patient Active Problem List   Diagnosis Date Noted   Moderate persistent asthma without complication 05/26/2020   Gastroesophageal reflux disease 05/26/2020   Constipation 12/10/2019   Unspecified asthma(493.90) 04/20/2013   Cat allergies 04/20/2013   Seasonal and perennial allergic rhinoconjunctivitis 04/20/2013    Past Medical History:  Diagnosis Date   Acne    tx with spironolactone   Allergy    Has allergies to dogs and cats.   Anxiety    no meds   Asthma    as a child, rarely uses inhaler   Cat allergies 04/20/2013   Depression    no meds   Depression    Phreesia 05/20/2020   GERD (gastroesophageal reflux disease)    occasional   Migraine headache with aura    Recurrent UTI     Current Outpatient Medications  Medication Sig Dispense Refill   budesonide-formoterol (SYMBICORT) 160-4.5 MCG/ACT inhaler Inhale 2 puffs into the lungs 2 (two) times daily. 1 each 11   budesonide-formoterol (SYMBICORT) 80-4.5 MCG/ACT inhaler Inhale 2 puffs into the lungs 2 (two) times daily. Rinse mouth after each use. 3 each 2   cetirizine (ZYRTEC) 10 MG tablet Take 1  tablet (10 mg total) by mouth daily as needed (for allergies.). 30 tablet 2   Cholecalciferol (VITAMIN D3 PO) Take 1 tablet by mouth daily.     Cyanocobalamin (VITAMIN B-12 PO) Take 1 tablet by mouth daily.     Etonogestrel (NEXPLANON [place]) Inject into the skin.      fluticasone (FLONASE) 50 MCG/ACT nasal spray Place 1 spray into both nostrils 2 (two) times daily as needed for allergies or rhinitis. 48 g 2   metaxalone (SKELAXIN) 800 MG tablet Take 1 tablet (800 mg  total) by mouth 3 (three) times daily. 90 tablet 0   montelukast (SINGULAIR) 10 MG tablet Take 1 tablet (10 mg total) by mouth at bedtime. 90 tablet 2   Multiple Vitamin (MULTIVITAMIN) tablet Take 1 tablet by mouth daily.     Naphazoline-Pheniramine (OPCON-A) 0.027-0.315 % SOLN Place 1 drop into both eyes 3 (three) times daily as needed (for irritated/itchy eyes.).     naratriptan (AMERGE) 2.5 MG tablet Take 1 mg by mouth as needed.     polyethylene glycol (MIRALAX) 17 g packet Take 17 g by mouth 2 (two) times daily. 14 each 0   senna (SENOKOT) 8.6 MG TABS tablet Take 1 tablet (8.6 mg total) by mouth daily as needed for mild constipation. 120 tablet 0   albuterol (VENTOLIN HFA) 108 (90 Base) MCG/ACT inhaler Inhale 1-2 puffs into the lungs every 4 (four) hours as needed for wheezing or shortness of breath. 18 each 11   No current facility-administered medications for this visit.    Allergies  Allergen Reactions   Other Hives, Shortness Of Breath and Itching    Grass, trees, dogs,cats, mold   Pollen Extract Other (See Comments)    Sneezing/itchy eyes/runny nose    Social History   Socioeconomic History   Marital status: Married    Spouse name: Not on file   Number of children: 0   Years of education: Not on file   Highest education level: Not on file  Occupational History   Occupation: retail aassociate  Tobacco Use   Smoking status: Never   Smokeless tobacco: Never  Vaping Use   Vaping Use: Never used  Substance and Sexual Activity   Alcohol use: Yes    Comment: 1 beer par week   Drug use: Not Currently    Types: Marijuana   Sexual activity: Not Currently    Birth control/protection: Abstinence    Comment: female partner  Other Topics Concern   Not on file  Social History Narrative   Not on file   Social Determinants of Health   Financial Resource Strain: Not on file  Food Insecurity: Not on file  Transportation Needs: Not on file  Physical Activity: Not on file   Stress: Not on file  Social Connections: Not on file  Intimate Partner Violence: Not on file    ROS Per hpi   Objective   Vitals as reported by the patient: There were no vitals filed for this visit.  Ranya was seen today for medication refill.  Diagnoses and all orders for this visit:  Chronic neck pain -     Ambulatory referral to Physical Therapy  Mild persistent asthma without complication -     albuterol (VENTOLIN HFA) 108 (90 Base) MCG/ACT inhaler; Inhale 1-2 puffs into the lungs every 4 (four) hours as needed for wheezing or shortness of breath. -     budesonide-formoterol (SYMBICORT) 160-4.5 MCG/ACT inhaler; Inhale 2 puffs into the lungs 2 (two) times  daily. -     Ambulatory referral to Allergy    PLAN Refer to PT Refer to asthma/allergy Refill symbicort at higher dose, albuterol Patient encouraged to call clinic with any questions, comments, or concerns.   I discussed the assessment and treatment plan with the patient. The patient was provided an opportunity to ask questions and all were answered. The patient agreed with the plan and demonstrated an understanding of the instructions.   The patient was advised to call back or seek an in-person evaluation if the symptoms worsen or if the condition fails to improve as anticipated.  I provided 14 minutes of non-face-to-face time during this encounter.  [name], NP

## 2022-01-01 ENCOUNTER — Telehealth: Payer: Self-pay | Admitting: Registered Nurse

## 2022-01-01 NOTE — Telephone Encounter (Signed)
Caller name: Josiah Lobo   On DPR? :yes/no: Yes  Call back number:[phone]  Provider they see:  Kateri Plummer   Reason for call: Pt just had a appt on 12/18/21. PT called stating that she was on anti depression medication in the past. Pt can't remember the name of medication bc it was 5 years ago. Pt want to know if she can get back on some kind of anti depression medication. PT also states that she is taking naratriptan 2.5 mg and chlorpromazine for migrants (PRN). (I tired to schedule her appt for Aug 3rd, pt can't make it to that appt). Please advise.

## 2022-01-01 NOTE — Telephone Encounter (Signed)
Left pt a detailed VM stating she will need an apt to be seen to start an anti depressant

## 2022-01-11 ENCOUNTER — Other Ambulatory Visit: Payer: Self-pay | Admitting: Registered Nurse

## 2022-01-11 ENCOUNTER — Other Ambulatory Visit: Payer: Self-pay | Admitting: Allergy

## 2022-01-11 DIAGNOSIS — M26623 Arthralgia of bilateral temporomandibular joint: Secondary | ICD-10-CM

## 2022-01-11 NOTE — Telephone Encounter (Signed)
Patient has moved out of state and has found a new pulmonologist in [place] where she lives. Refills through this office is no longer appropriate.

## 2022-01-29 ENCOUNTER — Telehealth: Payer: Self-pay

## 2022-01-29 NOTE — Telephone Encounter (Signed)
-----   Message from [facility] sent at 01/29/2022  4:22 PM EDT ----- Can you please add nexplanon removal and insertion for this pt

## 2022-01-29 NOTE — Telephone Encounter (Signed)
Msg received from scheduling for orders for nexplanon replacement. However, I was checking to see if pt was UTD on annual exam and looks like last was 09/13/2020.   FYI. Per telephone encounter 10/17/21, pt does not live locally.   Please advise.

## 2022-01-30 NOTE — Telephone Encounter (Signed)
We can do an annual for her the same day since she is traveling to the visit. She is due for a pap smear. Please arrange.

## 2022-01-31 ENCOUNTER — Other Ambulatory Visit: Payer: Self-pay

## 2022-01-31 DIAGNOSIS — Z3046 Encounter for surveillance of implantable subdermal contraceptive: Secondary | ICD-10-CM

## 2022-01-31 NOTE — Telephone Encounter (Signed)
FYI. I placed orders for the nexplanon replacement. However, please make changes to appt if needed per JJ request. Thanks.

## 2022-02-15 NOTE — Telephone Encounter (Signed)
Tried calling patient several times with no response.  Mailed out a letter to patient to contact office.

## 2022-02-26 ENCOUNTER — Encounter: Payer: Self-pay | Admitting: Obstetrics and Gynecology

## 2022-02-26 ENCOUNTER — Ambulatory Visit (INDEPENDENT_AMBULATORY_CARE_PROVIDER_SITE_OTHER): Payer: BLUE CROSS/BLUE SHIELD | Admitting: Obstetrics and Gynecology

## 2022-02-26 DIAGNOSIS — Z3046 Encounter for surveillance of implantable subdermal contraceptive: Secondary | ICD-10-CM

## 2022-02-26 NOTE — Patient Instructions (Signed)
Nexplanon Instructions After Insertion  Keep bandage clean and dry for 24 hours  May use ice/Tylenol/Ibuprofen for soreness or pain  If you develop fever, drainage or increased warmth from incision site-contact office immediately   

## 2022-02-26 NOTE — Progress Notes (Signed)
GYNECOLOGY  VISIT   HPI: 26 y.o.   Married White or Caucasian Hispanic or Latino  female   [id number] with No LMP recorded. Patient has had an implant.   here for nexplanon exchange   GYNECOLOGIC HISTORY: No LMP recorded. Patient has had an implant. Contraception:nexplanon  Menopausal hormone therapy: n/a        OB History     Gravida  0   Para  0   Term  0   Preterm  0   AB  0   Living  0      SAB  0   IAB  0   Ectopic  0   Multiple  0   Live Births  0              Patient Active Problem List   Diagnosis Date Noted   Moderate persistent asthma without complication 123456   Gastroesophageal reflux disease 05/26/2020   Constipation 12/10/2019   Unspecified asthma(493.90) 04/20/2013   Cat allergies 04/20/2013   Seasonal and perennial allergic rhinoconjunctivitis 04/20/2013    Past Medical History:  Diagnosis Date   Acne    tx with spironolactone   Allergy    Has allergies to dogs and cats.   Anxiety    no meds   Asthma    as a child, rarely uses inhaler   Cat allergies 04/20/2013   Depression    no meds   Depression    Phreesia 05/20/2020   GERD (gastroesophageal reflux disease)    occasional   Migraine headache with aura    Recurrent UTI     Past Surgical History:  Procedure Laterality Date   LESION REMOVAL N/A 05/28/2017   Procedure: PLASTICS REPAIR OF INTROIDUS;  Surgeon: Salvadore Dom, MD;  Location: [facility];  Service: Gynecology;  Laterality: N/A;   WISDOM TOOTH EXTRACTION      Current Outpatient Medications  Medication Sig Dispense Refill   albuterol (VENTOLIN HFA) 108 (90 Base) MCG/ACT inhaler Inhale 1-2 puffs into the lungs every 4 (four) hours as needed for wheezing or shortness of breath. 18 each 11   budesonide-formoterol (SYMBICORT) 160-4.5 MCG/ACT inhaler Inhale 2 puffs into the lungs 2 (two) times daily. 1 each 11   budesonide-formoterol (SYMBICORT) 80-4.5 MCG/ACT inhaler Inhale 2 puffs into the lungs 2 (two)  times daily. Rinse mouth after each use. 3 each 2   cetirizine (ZYRTEC) 10 MG tablet Take 1 tablet (10 mg total) by mouth daily as needed (for allergies.). 30 tablet 2   Cholecalciferol (VITAMIN D3 PO) Take 1 tablet by mouth daily.     Cyanocobalamin (VITAMIN B-12 PO) Take 1 tablet by mouth daily.     Etonogestrel (NEXPLANON [place]) Inject into the skin.      fluticasone (FLONASE) 50 MCG/ACT nasal spray Place 1 spray into both nostrils 2 (two) times daily as needed for allergies or rhinitis. 48 g 2   metaxalone (SKELAXIN) 800 MG tablet TAKE 1 TABLET BY MOUTH THREE TIMES A DAY 90 tablet 0   montelukast (SINGULAIR) 10 MG tablet Take 1 tablet (10 mg total) by mouth at bedtime. 90 tablet 2   Multiple Vitamin (MULTIVITAMIN) tablet Take 1 tablet by mouth daily.     Naphazoline-Pheniramine (OPCON-A) 0.027-0.315 % SOLN Place 1 drop into both eyes 3 (three) times daily as needed (for irritated/itchy eyes.).     naratriptan (AMERGE) 2.5 MG tablet Take 1 mg by mouth as needed.     polyethylene glycol (MIRALAX) 17 g  packet Take 17 g by mouth 2 (two) times daily. 14 each 0   senna (SENOKOT) 8.6 MG TABS tablet Take 1 tablet (8.6 mg total) by mouth daily as needed for mild constipation. 120 tablet 0   No current facility-administered medications for this visit.     ALLERGIES: Other and Pollen extract  Family History  Problem Relation Age of Onset   Hypertension Mother    Hyperlipidemia Mother    Scoliosis Mother    Lupus Maternal Grandmother    Osteoporosis Maternal Grandmother    Heart disease Maternal Grandfather    Hypertension Maternal Grandfather    Diabetes Maternal Grandfather    Emphysema Maternal Grandfather    Heart attack Maternal Grandfather        occurred < 44 years old   Hyperlipidemia Maternal Grandfather    COPD Maternal Grandfather    Cancer Maternal Grandfather        skin cancer   Osteoarthritis Paternal Grandmother    Diabetes Paternal Grandfather    Heart disease Paternal  Grandfather    Kidney failure Paternal Grandfather        2 transplants due to DM   Hypertension Paternal Grandfather    Hyperlipidemia Paternal Grandfather    Stroke Paternal Grandfather    Parkinson's disease Paternal Grandfather    Thyroid cancer Paternal Aunt     Social History   Socioeconomic History   Marital status: Married    Spouse name: Not on file   Number of children: 0   Years of education: Not on file   Highest education level: Not on file  Occupational History   Occupation: Special educational needs teacher  Tobacco Use   Smoking status: Never   Smokeless tobacco: Never  Vaping Use   Vaping Use: Never used  Substance and Sexual Activity   Alcohol use: Yes    Comment: 1 beer par week   Drug use: Not Currently    Types: Marijuana   Sexual activity: Not Currently    Birth control/protection: Abstinence    Comment: female partner  Other Topics Concern   Not on file  Social History Narrative   Not on file   Social Determinants of Health   Financial Resource Strain: Not on file  Food Insecurity: Not on file  Transportation Needs: Not on file  Physical Activity: Not on file  Stress: Not on file  Social Connections: Not on file  Intimate Partner Violence: Not on file    Review of Systems  All other systems reviewed and are negative.   PHYSICAL EXAMINATION:    There were no vitals taken for this visit.    General appearance: alert, cooperative and appears stated age  Risks of nexplanon removal and reinsertion were reviewed with the patient and a consent was signed.  The patient was placed in the supine position with her left arm bent at the elbow. The area was cleansed with betadine and injected with 1% lidocaine. A #11 blade was used to incise over the distal end of the nexplanon implant. The implant was grasped with a clamp and removed.   More local anesthesia was placed along the path where the new nexplanon would be placed. The nexplanon device was inserted in  the usual fashion without difficulty. Slight oozing from the insertion site was stopped with pressure. The device was palpated in place.  The patients arm was cleansed of betadine and a steri strip was placed over the incision. A gauze was wrapped around her arm.  She  tolerated the procedure well  Instructions for care were discussed.   1. Encounter for removal and reinsertion of Nexplanon -Removal of nexplanon - Insertion of implanon rod -She will f/u for an annual exam

## 2022-03-15 NOTE — Telephone Encounter (Signed)
Looks like pt had nexplanon replaced on 02/26/22 and scheduled her annual on 05/07/22. Will close encounter.

## 2022-04-26 NOTE — Progress Notes (Signed)
26 y.o. G0P0000 Married White or Caucasian Hispanic or Latino female here for annual exam.  She was a week late starting her period. She has a nexplanon, placed in 9/23. Sexually active, no pain.  Period Cycle (Days): 28 Period Duration (Days): 6 Period Pattern: Regular Menstrual Flow: Moderate Menstrual Control: Maxi pad Menstrual Control Change Freq (Hours): 6 Dysmenorrhea: (!) Moderate Dysmenorrhea Symptoms: Cramping, Headache  She has long term issues with constipation. Has a BM 3-4 days a week.   Migraines are a little better, on a new medication. See's a headache specialist.   Patient's last menstrual period was 05/06/2022.          Sexually active: Yes.    The current method of family planning is implant .    Exercising: Yes.    The patient has a physically strenuous job, but has no regular exercise apart from work.  Smoker:  no  Health Maintenance: Pap:  02/20/2019 WNL History of abnormal Pap:  no MMG:  None  BMD:   None  Colonoscopy: none  TDaP:  02/09/20  Gardasil: no, counseled, information given.      reports that she has never smoked. She has never used smokeless tobacco. She reports current alcohol use. She reports that she does not currently use drugs after having used the following drugs: Marijuana. She has a couple of drinks a month. She works as a Mudlogger.   Past Medical History:  Diagnosis Date   Acne    tx with spironolactone   Allergy    Has allergies to dogs and cats.   Anxiety    no meds   Asthma    as a child, rarely uses inhaler   Cat allergies 04/20/2013   Depression    no meds   Depression    Phreesia 05/20/2020   GERD (gastroesophageal reflux disease)    occasional   Migraine headache with aura    Recurrent UTI     Past Surgical History:  Procedure Laterality Date   LESION REMOVAL N/A 05/28/2017   Procedure: PLASTICS REPAIR OF INTROIDUS;  Surgeon: Romualdo Bolk, MD;  Location: [facility];  Service: Gynecology;  Laterality: N/A;    WISDOM TOOTH EXTRACTION      Current Outpatient Medications  Medication Sig Dispense Refill   albuterol (VENTOLIN HFA) 108 (90 Base) MCG/ACT inhaler Inhale 1-2 puffs into the lungs every 4 (four) hours as needed for wheezing or shortness of breath. 18 each 11   budesonide-formoterol (SYMBICORT) 160-4.5 MCG/ACT inhaler Inhale 2 puffs into the lungs 2 (two) times daily. 1 each 11   cetirizine (ZYRTEC) 10 MG tablet Take 1 tablet (10 mg total) by mouth daily as needed (for allergies.). 30 tablet 2   Cholecalciferol (VITAMIN D3 PO) Take 1 tablet by mouth daily.     Cyanocobalamin (VITAMIN B-12 PO) Take 1 tablet by mouth daily.     EMGALITY 120 MG/ML SOAJ Inject into the skin.     Etonogestrel (NEXPLANON [place]) Inject into the skin.      fluticasone (FLONASE) 50 MCG/ACT nasal spray Place 1 spray into both nostrils 2 (two) times daily as needed for allergies or rhinitis. 48 g 2   metaxalone (SKELAXIN) 800 MG tablet TAKE 1 TABLET BY MOUTH THREE TIMES A DAY 90 tablet 0   montelukast (SINGULAIR) 10 MG tablet Take 1 tablet (10 mg total) by mouth at bedtime. 90 tablet 2   Multiple Vitamin (MULTIVITAMIN) tablet Take 1 tablet by mouth daily.     Naphazoline-Pheniramine (  OPCON-A) 0.027-0.315 % SOLN Place 1 drop into both eyes 3 (three) times daily as needed (for irritated/itchy eyes.).     naratriptan (AMERGE) 2.5 MG tablet Take 1 mg by mouth as needed.     polyethylene glycol (MIRALAX) 17 g packet Take 17 g by mouth 2 (two) times daily. 14 each 0   No current facility-administered medications for this visit.    Family History  Problem Relation Age of Onset   Hypertension Mother    Hyperlipidemia Mother    Scoliosis Mother    Lupus Maternal Grandmother    Osteoporosis Maternal Grandmother    Heart disease Maternal Grandfather    Hypertension Maternal Grandfather    Diabetes Maternal Grandfather    Emphysema Maternal Grandfather    Heart attack Maternal Grandfather        occurred < 1 years old    Hyperlipidemia Maternal Grandfather    COPD Maternal Grandfather    Cancer Maternal Grandfather        skin cancer   Osteoarthritis Paternal Grandmother    Diabetes Paternal Grandfather    Heart disease Paternal Grandfather    Kidney failure Paternal Grandfather        2 transplants due to DM   Hypertension Paternal Grandfather    Hyperlipidemia Paternal Grandfather    Stroke Paternal Grandfather    Parkinson's disease Paternal Grandfather    Thyroid cancer Paternal Aunt     Review of Systems  All other systems reviewed and are negative.   Exam:   BP 110/62   Pulse 85   Ht 5\' 5"  (1.651 m)   Wt 135 lb (61.2 kg)   LMP 05/06/2022   SpO2 100%   BMI 22.47 kg/m   Weight change: @WEIGHTCHANGE @ Height:   Height: 5\' 5"  (165.1 cm)  Ht Readings from Last 3 Encounters:  05/07/22 5\' 5"  (1.651 m)  02/24/21 5\' 5"  (1.651 m)  01/20/21 5\' 5"  (1.651 m)    General appearance: alert, cooperative and appears stated age Head: Normocephalic, without obvious abnormality, atraumatic Neck: no adenopathy, supple, symmetrical, trachea midline and thyroid normal to inspection and palpation Lungs: clear to auscultation bilaterally Cardiovascular: regular rate and rhythm Breasts: normal appearance, no masses or tenderness Abdomen: soft, non-tender; non distended,  no masses,  no organomegaly Extremities: extremities normal, atraumatic, no cyanosis or edema Skin: Skin color, texture, turgor normal. No rashes or lesions Lymph nodes: Cervical, supraclavicular, and axillary nodes normal. No abnormal inguinal nodes palpated Neurologic: Grossly normal   Pelvic: External genitalia:  no lesions              Urethra:  normal appearing urethra with no masses, tenderness or lesions              Bartholins and Skenes: normal                 Vagina: normal appearing vagina with normal color and discharge, no lesions              Cervix: no lesions               Bimanual Exam:  Uterus:  normal size,  contour, position, consistency, mobility, non-tender              Adnexa: no mass, fullness, tenderness               Rectovaginal: Confirms               Anus:  normal sphincter tone, no lesions  Carolynn Serve, CMA chaperoned for the exam.  1. Well woman exam Discussed breast self exam Discussed calcium and vit D intake Nexplanon for contraception Declines std testing Information on gardasil given No screening labs today  2. Screening for cervical cancer - Cytology - PAP  3. Vitamin D deficiency - VITAMIN D 25 Hydroxy (Vit-D Deficiency, Fractures)  4. Vitamin B12 deficiency - Vitamin B12

## 2022-05-07 ENCOUNTER — Encounter: Payer: Self-pay | Admitting: Obstetrics and Gynecology

## 2022-05-07 ENCOUNTER — Ambulatory Visit (INDEPENDENT_AMBULATORY_CARE_PROVIDER_SITE_OTHER): Payer: BLUE CROSS/BLUE SHIELD | Admitting: Obstetrics and Gynecology

## 2022-05-07 ENCOUNTER — Other Ambulatory Visit (HOSPITAL_COMMUNITY)
Admission: RE | Admit: 2022-05-07 | Discharge: 2022-05-07 | Disposition: A | Discharge status: Home / Self Care | Payer: BLUE CROSS/BLUE SHIELD | Source: Ambulatory Visit | Attending: Obstetrics and Gynecology | Admitting: Obstetrics and Gynecology

## 2022-05-07 VITALS — BP 110/62 | HR 85 | Ht 65.0 in | Wt 135.0 lb

## 2022-05-07 DIAGNOSIS — Z124 Encounter for screening for malignant neoplasm of cervix: Secondary | ICD-10-CM

## 2022-05-07 DIAGNOSIS — Z01419 Encounter for gynecological examination (general) (routine) without abnormal findings: Secondary | ICD-10-CM

## 2022-05-07 DIAGNOSIS — E538 Deficiency of other specified B group vitamins: Secondary | ICD-10-CM | POA: Diagnosis not present

## 2022-05-07 DIAGNOSIS — E559 Vitamin D deficiency, unspecified: Secondary | ICD-10-CM

## 2022-05-07 NOTE — Patient Instructions (Signed)
Try magnesium 500 mg a day for constipation.  EXERCISE   We recommended that you start or continue a regular exercise program for good health. Physical activity is anything that gets your body moving, some is better than none. The CDC recommends 150 minutes per week of Moderate-Intensity Aerobic Activity and 2 or more days of Muscle Strengthening Activity.  Benefits of exercise are limitless: helps weight loss/weight maintenance, improves mood and energy, helps with depression and anxiety, improves sleep, tones and strengthens muscles, improves balance, improves bone density, protects from chronic conditions such as heart disease, high blood pressure and diabetes and so much more. To learn more visit: https://www.cdc.gov/physicalactivity/index.html  DIET: Good nutrition starts with a healthy diet of fruits, vegetables, whole grains, and lean protein sources. Drink plenty of water for hydration. Minimize empty calories, sodium, sweets. For more information about dietary recommendations visit: https://health.gov/our-work/nutrition-physical-activity/dietary-guidelines and https://www.myplate.gov/  ALCOHOL:  Women should limit their alcohol intake to no more than 7 drinks/beers/glasses of wine (combined, not each!) per week. Moderation of alcohol intake to this level decreases your risk of breast cancer and liver damage.  If you are concerned that you may have a problem, or your friends have told you they are concerned about your drinking, there are many resources to help. A well-known program that is free, effective, and available to all people all over the nation is Alcoholics Anonymous.  Check out this site to learn more: https://www.aa.org/   CALCIUM AND VITAMIN D:  Adequate intake of calcium and Vitamin D are recommended for bone health.  You should be getting between 1000-1200 mg of calcium and 800 units of Vitamin D daily between diet and supplements  PAP SMEARS:  Pap smears, to check for cervical  cancer or precancers,  have traditionally been done yearly, scientific advances have shown that most women can have pap smears less often.  However, every woman still should have a physical exam from her gynecologist every year. It will include a breast check, inspection of the vulva and vagina to check for abnormal growths or skin changes, a visual exam of the cervix, and then an exam to evaluate the size and shape of the uterus and ovaries. We will also provide age appropriate advice regarding health maintenance, like when you should have certain vaccines, screening for sexually transmitted diseases, bone density testing, colonoscopy, mammograms, etc.   MAMMOGRAMS:  All women over 40 years old should have a routine mammogram.   COLON CANCER SCREENING: Now recommend starting at age 45. At this time colonoscopy is not covered for routine screening until 50. There are take home tests that can be done between 45-49.   COLONOSCOPY:  Colonoscopy to screen for colon cancer is recommended for all women at age 50.  We know, you hate the idea of the prep.  We agree, BUT, having colon cancer and not knowing it is worse!!  Colon cancer so often starts as a polyp that can be seen and removed at colonscopy, which can quite literally save your life!  And if your first colonoscopy is normal and you have no family history of colon cancer, most women don't have to have it again for 10 years.  Once every ten years, you can do something that may end up saving your life, right?  We will be happy to help you get it scheduled when you are ready.  Be sure to check your insurance coverage so you understand how much it will cost.  It may be covered as a   preventative service at no cost, but you should check your particular policy.      Breast Self-Awareness Breast self-awareness means being familiar with how your breasts look and feel. It involves checking your breasts regularly and reporting any changes to your health care  provider. Practicing breast self-awareness is important. A change in your breasts can be a sign of a serious medical problem. Being familiar with how your breasts look and feel allows you to find any problems early, when treatment is more likely to be successful. All women should practice breast self-awareness, including women who have had breast implants. How to do a breast self-exam One way to learn what is normal for your breasts and whether your breasts are changing is to do a breast self-exam. To do a breast self-exam: Look for Changes  Remove all the clothing above your waist. Stand in front of a mirror in a room with good lighting. Put your hands on your hips. Push your hands firmly downward. Compare your breasts in the mirror. Look for differences between them (asymmetry), such as: Differences in shape. Differences in size. Puckers, dips, and bumps in one breast and not the other. Look at each breast for changes in your skin, such as: Redness. Scaly areas. Look for changes in your nipples, such as: Discharge. Bleeding. Dimpling. Redness. A change in position. Feel for Changes Carefully feel your breasts for lumps and changes. It is best to do this while lying on your back on the floor and again while sitting or standing in the shower or tub with soapy water on your skin. Feel each breast in the following way: Place the arm on the side of the breast you are examining above your head. Feel your breast with the other hand. Start in the nipple area and make  inch (2 cm) overlapping circles to feel your breast. Use the pads of your three middle fingers to do this. Apply light pressure, then medium pressure, then firm pressure. The light pressure will allow you to feel the tissue closest to the skin. The medium pressure will allow you to feel the tissue that is a little deeper. The firm pressure will allow you to feel the tissue close to the ribs. Continue the overlapping circles,  moving downward over the breast until you feel your ribs below your breast. Move one finger-width toward the center of the body. Continue to use the  inch (2 cm) overlapping circles to feel your breast as you move slowly up toward your collarbone. Continue the up and down exam using all three pressures until you reach your armpit.  Write Down What You Find  Write down what is normal for each breast and any changes that you find. Keep a written record with breast changes or normal findings for each breast. By writing this information down, you do not need to depend only on memory for size, tenderness, or location. Write down where you are in your menstrual cycle, if you are still menstruating. If you are having trouble noticing differences in your breasts, do not get discouraged. With time you will become more familiar with the variations in your breasts and more comfortable with the exam. How often should I examine my breasts? Examine your breasts every month. If you are breastfeeding, the best time to examine your breasts is after a feeding or after using a breast pump. If you menstruate, the best time to examine your breasts is 5-7 days after your period is over. During your period,   your breasts are lumpier, and it may be more difficult to notice changes. When should I see my health care provider? See your health care provider if you notice: A change in shape or size of your breasts or nipples. A change in the skin of your breast or nipples, such as a reddened or scaly area. Unusual discharge from your nipples. A lump or thick area that was not there before. Pain in your breasts. Anything that concerns you.  

## 2022-05-08 LAB — CYTOLOGY - PAP: Diagnosis: NEGATIVE

## 2022-05-08 LAB — VITAMIN D 25 HYDROXY (VIT D DEFICIENCY, FRACTURES): Vit D, 25-Hydroxy: 36 ng/mL (ref 30–100)

## 2022-05-08 LAB — VITAMIN B12: Vitamin B-12: 295 pg/mL (ref 200–1100)

## 2022-08-27 ENCOUNTER — Encounter: Payer: Self-pay | Admitting: Nurse Practitioner

## 2022-08-27 ENCOUNTER — Ambulatory Visit: Payer: BLUE CROSS/BLUE SHIELD | Admitting: Nurse Practitioner

## 2022-08-27 ENCOUNTER — Other Ambulatory Visit: Payer: BLUE CROSS/BLUE SHIELD

## 2022-08-27 VITALS — BP 100/58 | HR 76 | Ht 65.0 in | Wt 136.6 lb

## 2022-08-27 DIAGNOSIS — K5900 Constipation, unspecified: Secondary | ICD-10-CM

## 2022-08-27 NOTE — Progress Notes (Signed)
Agree with assessment / plan as outlined.  

## 2022-08-27 NOTE — Progress Notes (Addendum)
08/27/2022 Judy [name] [id number] Jul 23, 1995   Chief Complaint: Constipation   History of Present Illness: Judy [name] is a 27 year old female with a past medical history of asthma, migraine headaches and chronic constipation. She was initially seen in office by Dr. Havery Moros 01/20/2021 due to having constipation and she was instructed to take Miralax twice daily and she was given samples of Linzess. A colonoscopy was not warranted at that time. She also noted having a history of rectal prolapse as a child which did not require surgery but has not had any recurrence of that that she is aware of.   She presents today for further follow up regarding constipation.  Since she was last seen in office, she did take MiraLAX twice daily but recently stopped taking it because she felt weak after taking it.  She did not try Linzess as she did not wish to take a prescription medication for constipation.  Currently, she is taking Senokot 2 tabs in the morning and sometimes takes another 2 tabs in the evening which resulted in passing formed brown stool.  No rectal bleeding or black stools.  No diarrhea.  She has intermittent right or left lower abdominal pain which is more prominent when she has not passed a BM and improves after defecation.  No severe abdominal pain.  No fevers.  No weight loss.  She reported undergoing a [facility] in 2023 at [facility] which she reported showed stool burden in the colon otherwise was normal.       Latest Ref Rng & Units 04/13/2020    5:05 PM 02/20/2019   10:53 AM 01/07/2019    8:14 AM  CBC  WBC 3.4 - 10.8 x10E3/uL 9.7  6.1  5.8   Hemoglobin 11.1 - 15.9 g/dL 15.1  14.7  14.0   Hematocrit 34.0 - 46.6 % 43.5  43.6  41.4   Platelets 150 - 450 x10E3/uL 390  282  282        Latest Ref Rng & Units 04/13/2020    5:05 PM 05/12/2019   11:56 AM 02/20/2019   10:53 AM  CMP  Glucose 65 - 99 mg/dL 81  101  79   BUN 6 - 20 mg/dL 7  9  10    Creatinine 0.57 - 1.00  mg/dL 0.64  0.75  0.84   Sodium 134 - 144 mmol/L 141  139  137   Potassium 3.5 - 5.2 mmol/L 4.5  4.1  4.5   Chloride 96 - 106 mmol/L 103  104  103   CO2 20 - 29 mmol/L 25  21  22    Calcium 8.7 - 10.2 mg/dL 10.1  9.8  9.4   Total Protein 6.0 - 8.5 g/dL   6.8   Total Bilirubin 0.0 - 1.2 mg/dL   0.4   Alkaline Phos 39 - 117 IU/L   57   AST 0 - 40 IU/L   18   ALT 0 - 32 IU/L   10    TSH 2.240.   Current Outpatient Medications on File Prior to Visit  Medication Sig Dispense Refill   albuterol (VENTOLIN HFA) 108 (90 Base) MCG/ACT inhaler Inhale 1-2 puffs into the lungs every 4 (four) hours as needed for wheezing or shortness of breath. 18 each 11   cetirizine (ZYRTEC) 10 MG tablet Take 1 tablet (10 mg total) by mouth daily as needed (for allergies.). 30 tablet 2   Cholecalciferol (VITAMIN D3 PO) Take 1 tablet by  mouth daily.     Cyanocobalamin (VITAMIN B-12 PO) Take 1 tablet by mouth daily.     EMGALITY 120 MG/ML SOAJ Inject into the skin.     fluticasone (FLONASE) 50 MCG/ACT nasal spray Place 1 spray into both nostrils 2 (two) times daily as needed for allergies or rhinitis. 48 g 2   metaxalone (SKELAXIN) 800 MG tablet TAKE 1 TABLET BY MOUTH THREE TIMES A DAY 90 tablet 0   montelukast (SINGULAIR) 10 MG tablet Take 1 tablet (10 mg total) by mouth at bedtime. 90 tablet 2   Multiple Vitamin (MULTIVITAMIN) tablet Take 1 tablet by mouth daily.     Naphazoline-Pheniramine (OPCON-A) 0.027-0.315 % SOLN Place 1 drop into both eyes 3 (three) times daily as needed (for irritated/itchy eyes.).     naratriptan (AMERGE) 2.5 MG tablet Take 1 mg by mouth as needed.     No current facility-administered medications on file prior to visit.   Allergies  Allergen Reactions   Cat Hair Extract Hives, Itching and Shortness Of Breath   Other Hives, Shortness Of Breath and Itching    Grass, trees, dogs,cats, mold   Doxycycline     "Felt faint"   Pollen Extract Other (See Comments)    Sneezing/itchy eyes/runny  nose   Current Medications, Allergies, Past Medical History, Past Surgical History, Family History and Social History were reviewed in [facility] electronic medical record.  Review of Systems:   Constitutional: Negative for fever, sweats, chills or weight loss.  Respiratory: Negative for shortness of breath.   Cardiovascular: Negative for chest pain, palpitations and leg swelling.  Gastrointestinal: See HPI.  Musculoskeletal: Negative for back pain or muscle aches.  Neurological: Negative for dizziness, headaches or paresthesias.    Physical Exam: BP (!) 100/58   Pulse 76   Ht 5\' 5"  (1.651 m)   Wt 136 lb 9.6 oz (62 kg)   BMI 22.73 kg/m   General: 27 year old female in no acute distress. Head: Normocephalic and atraumatic. Eyes: No scleral icterus. Conjunctiva pink . Ears: Normal auditory acuity. Mouth: Dentition intact. No ulcers or lesions.  Lungs: Clear throughout to auscultation. Heart: Regular rate and rhythm, no murmur. Abdomen: Soft, nondistended. Mild periumbilical tenderness without rebound or guarding.  No masses or hepatomegaly. Normal bowel sounds x 4 quadrants.  Rectal: Deferred. Musculoskeletal: Symmetrical with no gross deformities. Extremities: No edema. Neurological: Alert oriented x 4. No focal deficits.  Psychological: Alert and cooperative. Normal mood and affect  Assessment and Recommendations:  27 year old female with chronic constipation with variable periumbilical and lower abdominal pain.  No severe abdominal pain.  She recently stopped taking MiraLAX twice daily because she felt weak after taking it.  She is passing a BM most days as long as she takes Senokot 2 to 4 tabs daily.  -Low-dose Linzess discussed, patient declined as she is concerned it may trigger headaches -Benefiber 1 tablespoon daily -Magnesium oxide 400 mg tab 1 p.o. nightly -IBgard 1 p.o. twice daily as needed for abdominal pain -Aloe vera juice or prune juice as  tolerated -Drink 64 ounces of water daily -Request copy of CTAP 2023 -Defer diagnostic colonoscopy for now -CBC, CMP, sed rate, CRP, TTG, IgA and TSH level -Patient request nutritional consult, will initiate after the above lab results reviewed  Today's encounter was 25 minutes which included precharting, chart/result review, history/exam, face-to-face time used for counseling, formulating a treatment plan with follow-up and documentation.  ADDENDUM: CTAP with contrast 11/17/2021 at [facility] showed moderate stool burden throughout  the colon without obstruction. Left adexal corpus luteum cyst.

## 2022-08-27 NOTE — Patient Instructions (Addendum)
Your provider has requested that you get your lab work done.   IB Guard- 1 by mouth twice daily for abdominal pain  Benefiber- 1 tablespoon daily  Magnesium oxide 400 mg- 1 by mouth every night   Due to recent changes in healthcare laws, you may see the results of your imaging and laboratory studies on MyChart before your provider has had a chance to review them.  We understand that in some cases there may be results that are confusing or concerning to you. Not all laboratory results come back in the same time frame and the provider may be waiting for multiple results in order to interpret others.  Please give [place] 48 hours in order for your provider to thoroughly review all the results before contacting the office for clarification of your results.   Thank you for trusting me with your gastrointestinal care!   Carl Best, CRNP

## 2022-08-28 LAB — COMPREHENSIVE METABOLIC PANEL
AG Ratio: 1.6 (calc) (ref 1.0–2.5)
ALT: 17 U/L (ref 6–29)
AST: 21 U/L (ref 10–30)
Albumin: 4.5 g/dL (ref 3.6–5.1)
Alkaline phosphatase (APISO): 50 U/L (ref 31–125)
BUN: 7 mg/dL (ref 7–25)
CO2: 20 mmol/L (ref 20–32)
Calcium: 10 mg/dL (ref 8.6–10.2)
Chloride: 104 mmol/L (ref 98–110)
Creat: 0.92 mg/dL (ref 0.50–0.96)
Globulin: 2.8 g/dL (calc) (ref 1.9–3.7)
Glucose, Bld: 86 mg/dL (ref 65–99)
Potassium: 4.1 mmol/L (ref 3.5–5.3)
Sodium: 140 mmol/L (ref 135–146)
Total Bilirubin: 0.4 mg/dL (ref 0.2–1.2)
Total Protein: 7.3 g/dL (ref 6.1–8.1)

## 2022-08-28 LAB — SEDIMENTATION RATE: Sed Rate: 6 mm/h (ref 0–20)

## 2022-08-28 LAB — CBC
HCT: 42.5 % (ref 35.0–45.0)
Hemoglobin: 14.6 g/dL (ref 11.7–15.5)
MCH: 31.9 pg (ref 27.0–33.0)
MCHC: 34.4 g/dL (ref 32.0–36.0)
MCV: 93 fL (ref 80.0–100.0)
MPV: 9.5 fL (ref 7.5–12.5)
Platelets: 300 10*3/uL (ref 140–400)
RBC: 4.57 10*6/uL (ref 3.80–5.10)
RDW: 11.7 % (ref 11.0–15.0)
WBC: 5.9 10*3/uL (ref 3.8–10.8)

## 2022-08-28 LAB — TISSUE TRANSGLUTAMINASE, IGA: (tTG) Ab, IgA: <1 U/mL

## 2022-08-28 LAB — TSH: TSH: 0.93 mIU/L

## 2022-08-28 LAB — IGA: Immunoglobulin A: 204 mg/dL (ref 47–310)

## 2022-08-28 LAB — C-REACTIVE PROTEIN: CRP: 1.1 mg/L (ref <8.0)
